# Patient Record
Sex: Female | Born: 1940
Health system: Southern US, Community
[De-identification: ages and names within clinical notes are randomized; demographics above are authoritative.]

## PROBLEM LIST (undated history)

## (undated) DIAGNOSIS — E119 Type 2 diabetes mellitus without complications: Secondary | ICD-10-CM

## (undated) DIAGNOSIS — I509 Heart failure, unspecified: Secondary | ICD-10-CM

## (undated) DIAGNOSIS — I350 Nonrheumatic aortic (valve) stenosis: Secondary | ICD-10-CM

## (undated) DIAGNOSIS — I10 Essential (primary) hypertension: Secondary | ICD-10-CM

## (undated) DIAGNOSIS — K5289 Other specified noninfective gastroenteritis and colitis: Secondary | ICD-10-CM

## (undated) DIAGNOSIS — M1990 Unspecified osteoarthritis, unspecified site: Secondary | ICD-10-CM

## (undated) DIAGNOSIS — C20 Malignant neoplasm of rectum: Secondary | ICD-10-CM

## (undated) DIAGNOSIS — J45909 Unspecified asthma, uncomplicated: Secondary | ICD-10-CM

## (undated) DIAGNOSIS — K219 Gastro-esophageal reflux disease without esophagitis: Secondary | ICD-10-CM

## (undated) DIAGNOSIS — K625 Hemorrhage of anus and rectum: Principal | ICD-10-CM

## (undated) DIAGNOSIS — E7800 Pure hypercholesterolemia, unspecified: Secondary | ICD-10-CM

## (undated) DIAGNOSIS — E039 Hypothyroidism, unspecified: Secondary | ICD-10-CM

## (undated) DIAGNOSIS — D649 Anemia, unspecified: Secondary | ICD-10-CM

## (undated) DIAGNOSIS — R011 Cardiac murmur, unspecified: Secondary | ICD-10-CM

## (undated) HISTORY — DX: Other specified noninfective gastroenteritis and colitis: K52.89

## (undated) HISTORY — DX: Heart failure, unspecified: I50.9

## (undated) HISTORY — PX: TOOTH EXTRACTION: SUR596

## (undated) HISTORY — PX: WISDOM TOOTH EXTRACTION: SHX21

## (undated) HISTORY — PX: IUD REMOVAL: SHX5392

## (undated) HISTORY — DX: Gastro-esophageal reflux disease without esophagitis: K21.9

## (undated) HISTORY — DX: Type 2 diabetes mellitus without complications: E11.9

## (undated) HISTORY — DX: Essential (primary) hypertension: I10

## (undated) HISTORY — DX: Hemorrhage of anus and rectum: K62.5

## (undated) HISTORY — PX: TONSILLECTOMY: SUR1361

## (undated) HISTORY — DX: Pure hypercholesterolemia, unspecified: E78.00

## (undated) HISTORY — DX: Nonrheumatic aortic (valve) stenosis: I35.0

---

## 2009-01-26 DIAGNOSIS — K625 Hemorrhage of anus and rectum: Secondary | ICD-10-CM

## 2009-01-26 HISTORY — DX: Hemorrhage of anus and rectum: K62.5

## 2009-01-26 HISTORY — PX: COLONOSCOPY: SHX174

## 2009-03-13 ENCOUNTER — Ambulatory Visit: Payer: Self-pay | Admitting: General Surgery

## 2009-03-26 ENCOUNTER — Ambulatory Visit: Payer: Self-pay | Admitting: General Surgery

## 2009-03-26 LAB — HM COLONOSCOPY

## 2010-05-06 ENCOUNTER — Ambulatory Visit: Payer: Self-pay | Admitting: Family Medicine

## 2010-05-06 DIAGNOSIS — R0989 Other specified symptoms and signs involving the circulatory and respiratory systems: Secondary | ICD-10-CM | POA: Insufficient documentation

## 2011-03-03 DIAGNOSIS — E119 Type 2 diabetes mellitus without complications: Secondary | ICD-10-CM | POA: Diagnosis not present

## 2011-03-03 DIAGNOSIS — I10 Essential (primary) hypertension: Secondary | ICD-10-CM | POA: Diagnosis not present

## 2011-03-03 DIAGNOSIS — E559 Vitamin D deficiency, unspecified: Secondary | ICD-10-CM | POA: Diagnosis not present

## 2011-03-03 DIAGNOSIS — E7800 Pure hypercholesterolemia, unspecified: Secondary | ICD-10-CM | POA: Diagnosis not present

## 2011-03-06 DIAGNOSIS — E7800 Pure hypercholesterolemia, unspecified: Secondary | ICD-10-CM | POA: Diagnosis not present

## 2011-03-06 DIAGNOSIS — E559 Vitamin D deficiency, unspecified: Secondary | ICD-10-CM | POA: Diagnosis not present

## 2011-03-06 DIAGNOSIS — E039 Hypothyroidism, unspecified: Secondary | ICD-10-CM | POA: Diagnosis not present

## 2011-03-06 DIAGNOSIS — I10 Essential (primary) hypertension: Secondary | ICD-10-CM | POA: Diagnosis not present

## 2011-03-06 DIAGNOSIS — E119 Type 2 diabetes mellitus without complications: Secondary | ICD-10-CM | POA: Diagnosis not present

## 2011-10-23 DIAGNOSIS — E785 Hyperlipidemia, unspecified: Secondary | ICD-10-CM | POA: Diagnosis not present

## 2011-10-23 DIAGNOSIS — E119 Type 2 diabetes mellitus without complications: Secondary | ICD-10-CM | POA: Diagnosis not present

## 2011-10-23 DIAGNOSIS — E039 Hypothyroidism, unspecified: Secondary | ICD-10-CM | POA: Diagnosis not present

## 2011-10-23 DIAGNOSIS — Z79899 Other long term (current) drug therapy: Secondary | ICD-10-CM | POA: Diagnosis not present

## 2011-10-27 DIAGNOSIS — E039 Hypothyroidism, unspecified: Secondary | ICD-10-CM | POA: Diagnosis not present

## 2011-10-27 DIAGNOSIS — E119 Type 2 diabetes mellitus without complications: Secondary | ICD-10-CM | POA: Diagnosis not present

## 2011-10-27 DIAGNOSIS — E7800 Pure hypercholesterolemia, unspecified: Secondary | ICD-10-CM | POA: Diagnosis not present

## 2011-10-27 DIAGNOSIS — Z79899 Other long term (current) drug therapy: Secondary | ICD-10-CM | POA: Diagnosis not present

## 2011-10-27 DIAGNOSIS — E559 Vitamin D deficiency, unspecified: Secondary | ICD-10-CM | POA: Diagnosis not present

## 2011-10-27 DIAGNOSIS — I10 Essential (primary) hypertension: Secondary | ICD-10-CM | POA: Diagnosis not present

## 2012-06-24 DIAGNOSIS — M779 Enthesopathy, unspecified: Secondary | ICD-10-CM | POA: Diagnosis not present

## 2012-06-24 DIAGNOSIS — M25569 Pain in unspecified knee: Secondary | ICD-10-CM | POA: Diagnosis not present

## 2012-06-24 DIAGNOSIS — Z79899 Other long term (current) drug therapy: Secondary | ICD-10-CM | POA: Diagnosis not present

## 2012-06-24 DIAGNOSIS — E785 Hyperlipidemia, unspecified: Secondary | ICD-10-CM | POA: Diagnosis not present

## 2012-06-27 DIAGNOSIS — K5289 Other specified noninfective gastroenteritis and colitis: Secondary | ICD-10-CM

## 2012-06-27 HISTORY — DX: Other specified noninfective gastroenteritis and colitis: K52.89

## 2012-07-01 ENCOUNTER — Encounter: Payer: Self-pay | Admitting: General Surgery

## 2012-07-01 ENCOUNTER — Ambulatory Visit (INDEPENDENT_AMBULATORY_CARE_PROVIDER_SITE_OTHER): Payer: Medicare Other | Admitting: General Surgery

## 2012-07-01 ENCOUNTER — Other Ambulatory Visit: Payer: Self-pay | Admitting: General Surgery

## 2012-07-01 VITALS — BP 130/76 | HR 76 | Temp 96.2°F | Resp 12 | Ht 64.0 in | Wt 173.0 lb

## 2012-07-01 DIAGNOSIS — K625 Hemorrhage of anus and rectum: Secondary | ICD-10-CM | POA: Diagnosis not present

## 2012-07-01 DIAGNOSIS — K5190 Ulcerative colitis, unspecified, without complications: Secondary | ICD-10-CM | POA: Diagnosis not present

## 2012-07-01 LAB — CBC WITH DIFFERENTIAL/PLATELET
Basophil #: 0.1 10*3/uL (ref 0.0–0.1)
Basophil %: 0.5 %
Eosinophil %: 0.6 %
Lymphocyte #: 0.9 10*3/uL — ABNORMAL LOW (ref 1.0–3.6)
Lymphocyte %: 6.6 %
MCH: 32.7 pg (ref 26.0–34.0)
MCHC: 34.2 g/dL (ref 32.0–36.0)
MCV: 96 fL (ref 80–100)
Monocyte %: 6.5 %
RBC: 3.88 10*6/uL (ref 3.80–5.20)
RDW: 13.2 % (ref 11.5–14.5)
WBC: 13 10*3/uL — ABNORMAL HIGH (ref 3.6–11.0)

## 2012-07-01 LAB — BASIC METABOLIC PANEL
Creatinine: 1.94 mg/dL — ABNORMAL HIGH (ref 0.60–1.30)
EGFR (African American): 29 — ABNORMAL LOW
Potassium: 4.1 mmol/L (ref 3.5–5.1)

## 2012-07-01 MED ORDER — AMOXICILLIN-POT CLAVULANATE 500-125 MG PO TABS: 1.0000 | ORAL_TABLET | Freq: Two times a day (BID) | ORAL | Status: DC

## 2012-07-01 MED ORDER — AMOXICILLIN-POT CLAVULANATE 875-125 MG PO TABS: 1.0000 | ORAL_TABLET | Freq: Two times a day (BID) | ORAL | Status: DC

## 2012-07-01 NOTE — Patient Instructions (Addendum)
Give an antibiotic 2 times daily. The patient is aware to use a heating pad as needed for comfort.

## 2012-07-01 NOTE — Progress Notes (Addendum)
Patient ID: Tracy Silva, female   DOB: 14-Oct-1940, 72 y.o.   MRN: [id number]  Chief Complaint  Patient presents with  . Rectal Problems    HPI Tracy Silva is a 72 y.o. female here today for rectal bleeding started this morning around 1.00am. Patient states it was bright red blood in the bowl and on the paper.  HPI Comments: The patient reports that earlier this morning she was awakened from sleep with lower bowel discomfort and went to the restroom. She thought she was having simple diarrhea, but on further inspection noticed bright red blood in the toilet bowl. This was occurred twice prior to presentation. She reports some lower abdominal discomfort. Perhaps mild nausea, but no vomiting. She had felt well prior to this morning. She has recently been started on Lipitor in question was medication could be responsible.  In 2011 she was evaluated for similar ill-defined lower abdominal pain and rectal bleeding. At that time a polyp was identified in the sigmoid colon which was a tubular adenoma and an area of erythematous tissue was biopsied showed similar changes. A 5 year followup had been planned. The patient's weight is a 20 pounds from her February 2011 exam.  The patient was accompanied today by her only child, Tracy Silva who was present for the interview and exam. Past Medical History  Diagnosis Date  . Hypertension   . Diabetes mellitus without complication   . Acid reflux   . High cholesterol   . Rectal bleeding 2011    Past Surgical History  Procedure Laterality Date  . Colonoscopy  2011  . Tonsillectomy    . Wisdom tooth extraction      Family History  Problem Relation Age of Onset  . Pancreatic cancer Mother     Social History History  Substance Use Topics  . Smoking status: Never Smoker   . Smokeless tobacco: Never Used  . Alcohol Use: No    Allergies  Allergen Reactions  . Codeine     headaches    Current Outpatient Prescriptions  Medication Sig  Dispense Refill  . amLODipine-benazepril (LOTREL) 5-10 MG per capsule Take 1 capsule by mouth daily.      Marland Kitchen amoxicillin-clavulanate (AUGMENTIN) 875-125 MG per tablet Take 1 tablet by mouth every 12 (twelve) hours.  20 tablet  0  . aspirin 81 MG tablet Take 81 mg by mouth daily.      Marland Kitchen atorvastatin (LIPITOR) 10 MG tablet Take 10 mg by mouth daily.      . Cholecalciferol (VITAMIN D) 1000 UNITS capsule Take 1,000 Units by mouth daily.      . hydrochlorothiazide (MICROZIDE) 12.5 MG capsule Take 12.5 mg by mouth daily.      Marland Kitchen levothyroxine (SYNTHROID, LEVOTHROID) 100 MCG tablet Take 100 mcg by mouth daily before breakfast.      . meloxicam (MOBIC) 7.5 MG tablet Take 7.5 mg by mouth 2 (two) times daily as needed for pain.      . metoprolol (TOPROL-XL) 200 MG 24 hr tablet Take 200 mg by mouth daily.      . Multiple Vitamins-Minerals (CENTRUM SILVER ADULT 50+ PO) Take 1 tablet by mouth daily.      Marland Kitchen omeprazole (PRILOSEC OTC) 20 MG tablet Take 20 mg by mouth daily.      . sitaGLIPtin (JANUVIA) 100 MG tablet Take 100 mg by mouth daily.       No current facility-administered medications for this visit.    Review of Systems Review  of Systems  Constitutional: Positive for chills. Negative for fever, diaphoresis, activity change, appetite change, fatigue and unexpected weight change.  HENT: Negative.   Respiratory: Negative.   Cardiovascular: Negative.   Gastrointestinal: Positive for nausea (alittle), abdominal pain and anal bleeding. Negative for vomiting, diarrhea, constipation, abdominal distention and rectal pain.    Blood pressure 130/76, pulse 76, temperature 96.2 F (35.7 C), resp. rate 12, height 5\' 4"  (1.626 m), weight 173 lb (78.472 kg).  Physical Exam Physical Exam  Constitutional: She appears well-developed and well-nourished.  Eyes: Conjunctivae are normal. No scleral icterus.  Neck: Neck supple.  Cardiovascular: Intact distal pulses and normal pulses.   Murmur heard.  Systolic  murmur is present with a grade of 3/6  Abdominal: Normal appearance and bowel sounds are normal. There is tenderness in the left lower quadrant. There is no rigidity, no rebound and no guarding. No hernia.  Mild left lower quadrant tenderness with firm palpation is appreciated. No peritoneal irritation, guarding or referred pain.    Data Reviewed Laboratory studies dated 03/06/2011 showed a hemoglobin of 13.6 with an MCV of 96. 39% polys, 44% lymphocytes. Hemoglobin 13.6. BUN at that time 36, creatinine 1.34 with an estimated GFR of 40. Hemoglobin A1c 8.1%.  Repeat comprehensive metabolic panel dated [id number] showed a creatinine of 1.86 with an estimated GFR of 26. Hemoglobin A1c 8.9%.      Colitis, acute.    Plan    The patient does not appear toxic. Considering the bright red blood evident in the toilet bowl on today's exam, and in light of her negative colonoscopy (tubular adenoma x2) in 2011, she'll be placed on Augmentin 500 mg by mouth twice a day. To minimize GIF-XQ then asked make use of a probiotic twice a day. If her symptoms worsen, she develops fever, chills or lightheadedness, she was encouraged to call over the weekend and will likely need to be evaluated in the emergency department. Otherwise, we'll contact her on Monday, June 9 for a followup.  A CBC and sed rate will be obtained today.     Her laboratory studies from October became available after the original prescription for Augmentin 875 mg twice a day had been sent to the [facility]. I contacted the pharmacy and canceled the 875 prescription and sent to new prescription for Augmentin 500 mg by mouth twice a day for 10 days.      CBC showed a white blood cell count of 13,000 with a left shift, 85% polys. Hemoglobin 12.7, minimally changed from February 2013. ESR: 20.  Renal function shows modest deterioration with a creatinine of 1.94, BUN 39, random blood sugar 192. Estimated GFR 25.  Tracy Silva 07/01/2012, 12:09 PM

## 2012-07-04 ENCOUNTER — Encounter: Payer: Self-pay | Admitting: General Surgery

## 2012-07-04 ENCOUNTER — Telehealth: Payer: Self-pay | Admitting: General Surgery

## 2012-07-04 NOTE — Telephone Encounter (Signed)
Patient reports she is now pain free. Bleeding stopped yesterday. Tolerating a regular diet. Will arrange for an office f/u in one week.

## 2012-07-05 ENCOUNTER — Telehealth: Payer: Self-pay | Admitting: *Deleted

## 2012-07-05 NOTE — Telephone Encounter (Signed)
Phone call from patient states she is constipated.  No bm since Friday.  Advised to try miralax daily and to call back for further issues.

## 2012-07-11 ENCOUNTER — Ambulatory Visit (INDEPENDENT_AMBULATORY_CARE_PROVIDER_SITE_OTHER): Payer: Medicare Other | Admitting: General Surgery

## 2012-07-11 ENCOUNTER — Encounter: Payer: Self-pay | Admitting: General Surgery

## 2012-07-11 VITALS — BP 140/78 | HR 74 | Resp 14 | Ht 63.0 in | Wt 168.0 lb

## 2012-07-11 DIAGNOSIS — K529 Noninfective gastroenteritis and colitis, unspecified: Secondary | ICD-10-CM

## 2012-07-11 DIAGNOSIS — K5289 Other specified noninfective gastroenteritis and colitis: Secondary | ICD-10-CM | POA: Diagnosis not present

## 2012-07-11 DIAGNOSIS — K625 Hemorrhage of anus and rectum: Secondary | ICD-10-CM

## 2012-07-11 LAB — POC HEMOCCULT BLD/STL (OFFICE/1-CARD/DIAGNOSTIC): Fecal Occult Blood, POC: NEGATIVE

## 2012-07-11 NOTE — Progress Notes (Signed)
Patient ID: Tracy Silva, female   DOB: 25-Mar-1940, 72 y.o.   MRN: [id number]  Chief Complaint  Patient presents with  . Follow-up    colitis    HPI Tracy Silva is a 72 y.o. female here today following up her recent episode of colitis associated with crampy lower abdominal pain and bloody diarrhea.  Patient was previously seen for rectal bleeding with diarrhea and nausea. The patient complained of abdominal discomfort at that time which awakened her in from sleeping.  In 2011 she was evaluated for similar ill-defined lower abdominal pain and rectal bleeding. At that time a polyp was identified in the sigmoid colon which was a tubular adenoma and an area of erythematous tissue was biopsied showed similar changes.  The patient reports that the bleeding discontinued 3 days after the initiation of Augmentin therapy and she has been pain-free since that time. She is back on a regular diet without ill effect. She will be completing her Augmentin therapy later today.   HPI  Past Medical History  Diagnosis Date  . Hypertension   . Diabetes mellitus without complication   . Acid reflux   . High cholesterol   . Rectal bleeding 2011  . Other and unspecified noninfectious gastroenteritis and colitis(558.9) 06/27/2012    Past Surgical History  Procedure Laterality Date  . Colonoscopy  2011  . Tonsillectomy    . Wisdom tooth extraction      Family History  Problem Relation Age of Onset  . Pancreatic cancer Mother     Social History History  Substance Use Topics  . Smoking status: Never Smoker   . Smokeless tobacco: Never Used  . Alcohol Use: No    Allergies  Allergen Reactions  . Codeine     headaches    Current Outpatient Prescriptions  Medication Sig Dispense Refill  . amLODipine-benazepril (LOTREL) 5-10 MG per capsule Take 1 capsule by mouth daily.      Marland Kitchen amoxicillin-clavulanate (AUGMENTIN) 500-125 MG per tablet Take 1 tablet (500 mg total) by mouth 2 (two) times daily.   20 tablet  0  . aspirin 81 MG tablet Take 81 mg by mouth daily.      Marland Kitchen atorvastatin (LIPITOR) 10 MG tablet Take 10 mg by mouth daily.      . Cholecalciferol (VITAMIN D) 1000 UNITS capsule Take 1,000 Units by mouth daily.      . hydrochlorothiazide (MICROZIDE) 12.5 MG capsule Take 12.5 mg by mouth daily.      Marland Kitchen levothyroxine (SYNTHROID, LEVOTHROID) 100 MCG tablet Take 100 mcg by mouth daily before breakfast.      . meloxicam (MOBIC) 7.5 MG tablet Take 7.5 mg by mouth 2 (two) times daily as needed for pain.      . metoprolol (TOPROL-XL) 200 MG 24 hr tablet Take 200 mg by mouth daily.      . Multiple Vitamins-Minerals (CENTRUM SILVER ADULT 50+ PO) Take 1 tablet by mouth daily.      Marland Kitchen omeprazole (PRILOSEC OTC) 20 MG tablet Take 20 mg by mouth daily.      . sitaGLIPtin (JANUVIA) 100 MG tablet Take 100 mg by mouth daily.       No current facility-administered medications for this visit.    Review of Systems Review of Systems  Constitutional: Negative.   Respiratory: Negative.   Cardiovascular: Negative.     Blood pressure 140/78, pulse 74, resp. rate 14, height 5\' 3"  (1.6 m), weight 168 lb (76.204 kg).  Physical Exam Physical  Exam  Constitutional: She appears well-developed and well-nourished.  Cardiovascular: Normal rate and regular rhythm.   Pulmonary/Chest: Effort normal and breath sounds normal.  Abdominal: Soft. Bowel sounds are normal. She exhibits no distension and no mass. There is no tenderness.  Genitourinary: Rectum normal. Rectal exam shows no tenderness and anal tone normal. Guaiac negative stool.    Data Reviewed CBC obtained at the beginning of this episode showed a y plus O. Connell 13,000 with left shift.  Assessment    Colitis, resolved. Colonoscopy in 2011 showing a single tubular adenoma.     Plan    The patient will finish up her present course of Augmentin. She'll contact the office if she has any recurrent symptoms, will plan for a quick followup visit in  3 months to assess her progress.     Patient to have the following labs drawn at [facility] today: CBC.    Robert Bellow 07/12/2012, 8:25 PM

## 2012-07-11 NOTE — Patient Instructions (Addendum)
Patient to return 3 months    Patient to have labs drawn at [facility] today.

## 2012-07-12 ENCOUNTER — Encounter: Payer: Self-pay | Admitting: General Surgery

## 2012-07-12 LAB — CBC WITH DIFFERENTIAL/PLATELET
Basos: 1 % (ref 0–3)
Eos: 3 % (ref 0–5)
Immature Grans (Abs): 0 10*3/uL (ref 0.0–0.1)
Lymphs: 37 % (ref 14–46)
MCV: 94 fL (ref 79–97)
Monocytes Absolute: 0.6 10*3/uL (ref 0.1–0.9)
Monocytes: 9 % (ref 4–12)
Neutrophils Relative %: 50 % (ref 40–74)
RBC: 3.81 x10E6/uL (ref 3.77–5.28)
WBC: 6.3 10*3/uL (ref 3.4–10.8)

## 2012-07-21 DIAGNOSIS — N289 Disorder of kidney and ureter, unspecified: Secondary | ICD-10-CM | POA: Diagnosis not present

## 2012-07-21 DIAGNOSIS — I10 Essential (primary) hypertension: Secondary | ICD-10-CM | POA: Diagnosis not present

## 2012-07-21 DIAGNOSIS — E7800 Pure hypercholesterolemia, unspecified: Secondary | ICD-10-CM | POA: Diagnosis not present

## 2012-07-21 DIAGNOSIS — E119 Type 2 diabetes mellitus without complications: Secondary | ICD-10-CM | POA: Diagnosis not present

## 2012-07-22 DIAGNOSIS — E785 Hyperlipidemia, unspecified: Secondary | ICD-10-CM | POA: Diagnosis not present

## 2012-07-22 DIAGNOSIS — E039 Hypothyroidism, unspecified: Secondary | ICD-10-CM | POA: Diagnosis not present

## 2012-07-22 DIAGNOSIS — E119 Type 2 diabetes mellitus without complications: Secondary | ICD-10-CM | POA: Diagnosis not present

## 2012-07-22 DIAGNOSIS — E559 Vitamin D deficiency, unspecified: Secondary | ICD-10-CM | POA: Diagnosis not present

## 2012-07-22 DIAGNOSIS — I10 Essential (primary) hypertension: Secondary | ICD-10-CM | POA: Diagnosis not present

## 2012-10-19 ENCOUNTER — Ambulatory Visit: Payer: Self-pay | Admitting: General Surgery

## 2012-10-19 DIAGNOSIS — E039 Hypothyroidism, unspecified: Secondary | ICD-10-CM | POA: Diagnosis not present

## 2012-10-19 DIAGNOSIS — I10 Essential (primary) hypertension: Secondary | ICD-10-CM | POA: Diagnosis not present

## 2012-10-19 DIAGNOSIS — E119 Type 2 diabetes mellitus without complications: Secondary | ICD-10-CM | POA: Diagnosis not present

## 2012-10-19 DIAGNOSIS — E7800 Pure hypercholesterolemia, unspecified: Secondary | ICD-10-CM | POA: Diagnosis not present

## 2012-10-25 ENCOUNTER — Encounter: Payer: Self-pay | Admitting: General Surgery

## 2012-10-25 ENCOUNTER — Ambulatory Visit (INDEPENDENT_AMBULATORY_CARE_PROVIDER_SITE_OTHER): Payer: Medicare Other | Admitting: General Surgery

## 2012-10-25 VITALS — BP 130/70 | HR 74 | Resp 12 | Ht 63.0 in | Wt 169.0 lb

## 2012-10-25 DIAGNOSIS — K5289 Other specified noninfective gastroenteritis and colitis: Secondary | ICD-10-CM

## 2012-10-25 DIAGNOSIS — K529 Noninfective gastroenteritis and colitis, unspecified: Secondary | ICD-10-CM

## 2012-10-25 NOTE — Progress Notes (Signed)
Patient ID: Tracy Silva, female   DOB: 18-Oct-1940, 72 y.o.   MRN: [id number]  Chief Complaint  Patient presents with  . Other    colitis    HPI Tracy Silva is a 72 y.o. female here today following up from her three month colitis. Patient states she is not having any more problems with her bowels. No further bleeding reported.    HPI  Past Medical History  Diagnosis Date  . Hypertension   . Diabetes mellitus without complication   . Acid reflux   . High cholesterol   . Rectal bleeding 2011  . Other and unspecified noninfectious gastroenteritis and colitis(558.9) 06/27/2012    Past Surgical History  Procedure Laterality Date  . Colonoscopy  2011  . Tonsillectomy    . Wisdom tooth extraction      Family History  Problem Relation Age of Onset  . Pancreatic cancer Mother     Social History History  Substance Use Topics  . Smoking status: Never Smoker   . Smokeless tobacco: Never Used  . Alcohol Use: No    Allergies  Allergen Reactions  . Codeine     headaches    Current Outpatient Prescriptions  Medication Sig Dispense Refill  . amLODipine-benazepril (LOTREL) 5-10 MG per capsule Take 1 capsule by mouth daily.      Marland Kitchen amoxicillin-clavulanate (AUGMENTIN) 500-125 MG per tablet Take 1 tablet (500 mg total) by mouth 2 (two) times daily.  20 tablet  0  . aspirin 81 MG tablet Take 81 mg by mouth daily.      Marland Kitchen atorvastatin (LIPITOR) 10 MG tablet Take 10 mg by mouth daily.      . Cholecalciferol (VITAMIN D) 1000 UNITS capsule Take 1,000 Units by mouth daily.      . hydrochlorothiazide (MICROZIDE) 12.5 MG capsule Take 12.5 mg by mouth daily.      Marland Kitchen levothyroxine (SYNTHROID, LEVOTHROID) 100 MCG tablet Take 100 mcg by mouth daily before breakfast.      . meloxicam (MOBIC) 7.5 MG tablet Take 7.5 mg by mouth 2 (two) times daily as needed for pain.      . metoprolol (TOPROL-XL) 200 MG 24 hr tablet Take 200 mg by mouth daily.      . Multiple Vitamins-Minerals (CENTRUM SILVER  ADULT 50+ PO) Take 1 tablet by mouth daily.      Marland Kitchen omeprazole (PRILOSEC OTC) 20 MG tablet Take 20 mg by mouth daily.      . sitaGLIPtin (JANUVIA) 100 MG tablet Take 100 mg by mouth daily.       No current facility-administered medications for this visit.    Review of Systems Review of Systems  Constitutional: Negative.   Respiratory: Negative.   Cardiovascular: Negative.   Gastrointestinal: Negative.     Blood pressure 130/70, pulse 74, resp. rate 12, height 5\' 3"  (1.6 m), weight 169 lb (76.658 kg).  Physical Exam Physical Exam  Constitutional: She is oriented to person, place, and time. She appears well-developed and well-nourished.  Cardiovascular: Normal rate, regular rhythm and normal heart sounds.   Pulmonary/Chest: Breath sounds normal.  Abdominal: Soft. Normal appearance and bowel sounds are normal. There is no hepatomegaly. There is no tenderness. No hernia.  Lymphadenopathy:    She has no cervical adenopathy.  Neurological: She is alert and oriented to person, place, and time.  Skin: Skin is warm.    Data Reviewed No new data.  Assessment    Based on the findings of a adenomatous polyp  in 2011, a repeat colonoscopy has been recommended for 2016.     Plan    Patient to return in two year for her colonoscopy.        Robert Bellow 10/25/2012, 9:11 PM

## 2012-10-25 NOTE — Patient Instructions (Signed)
Patient to return in two years for colonoscopy.

## 2012-10-28 DIAGNOSIS — E119 Type 2 diabetes mellitus without complications: Secondary | ICD-10-CM | POA: Diagnosis not present

## 2012-11-17 DIAGNOSIS — E039 Hypothyroidism, unspecified: Secondary | ICD-10-CM | POA: Diagnosis not present

## 2012-11-17 DIAGNOSIS — E7800 Pure hypercholesterolemia, unspecified: Secondary | ICD-10-CM | POA: Diagnosis not present

## 2012-11-17 DIAGNOSIS — E119 Type 2 diabetes mellitus without complications: Secondary | ICD-10-CM | POA: Diagnosis not present

## 2012-11-17 DIAGNOSIS — I10 Essential (primary) hypertension: Secondary | ICD-10-CM | POA: Diagnosis not present

## 2012-11-17 DIAGNOSIS — Z23 Encounter for immunization: Secondary | ICD-10-CM | POA: Diagnosis not present

## 2013-02-01 DIAGNOSIS — E7800 Pure hypercholesterolemia, unspecified: Secondary | ICD-10-CM | POA: Diagnosis not present

## 2013-02-01 DIAGNOSIS — Z23 Encounter for immunization: Secondary | ICD-10-CM | POA: Diagnosis not present

## 2013-02-01 DIAGNOSIS — E119 Type 2 diabetes mellitus without complications: Secondary | ICD-10-CM | POA: Diagnosis not present

## 2013-02-01 DIAGNOSIS — E039 Hypothyroidism, unspecified: Secondary | ICD-10-CM | POA: Diagnosis not present

## 2013-02-01 DIAGNOSIS — I10 Essential (primary) hypertension: Secondary | ICD-10-CM | POA: Diagnosis not present

## 2013-02-09 DIAGNOSIS — E785 Hyperlipidemia, unspecified: Secondary | ICD-10-CM | POA: Diagnosis not present

## 2013-02-09 DIAGNOSIS — E119 Type 2 diabetes mellitus without complications: Secondary | ICD-10-CM | POA: Diagnosis not present

## 2013-02-09 DIAGNOSIS — E039 Hypothyroidism, unspecified: Secondary | ICD-10-CM | POA: Diagnosis not present

## 2013-06-05 DIAGNOSIS — I10 Essential (primary) hypertension: Secondary | ICD-10-CM | POA: Diagnosis not present

## 2013-06-05 DIAGNOSIS — J309 Allergic rhinitis, unspecified: Secondary | ICD-10-CM | POA: Diagnosis not present

## 2013-06-05 DIAGNOSIS — E119 Type 2 diabetes mellitus without complications: Secondary | ICD-10-CM | POA: Diagnosis not present

## 2013-06-05 DIAGNOSIS — E039 Hypothyroidism, unspecified: Secondary | ICD-10-CM | POA: Diagnosis not present

## 2013-06-05 DIAGNOSIS — Z23 Encounter for immunization: Secondary | ICD-10-CM | POA: Diagnosis not present

## 2013-06-05 DIAGNOSIS — E7800 Pure hypercholesterolemia, unspecified: Secondary | ICD-10-CM | POA: Diagnosis not present

## 2013-08-29 DIAGNOSIS — E7800 Pure hypercholesterolemia, unspecified: Secondary | ICD-10-CM | POA: Diagnosis not present

## 2013-08-29 DIAGNOSIS — J309 Allergic rhinitis, unspecified: Secondary | ICD-10-CM | POA: Diagnosis not present

## 2013-08-29 DIAGNOSIS — Z0000 Encounter for general adult medical examination without abnormal findings: Secondary | ICD-10-CM | POA: Diagnosis not present

## 2013-08-29 DIAGNOSIS — Z1339 Encounter for screening examination for other mental health and behavioral disorders: Secondary | ICD-10-CM | POA: Diagnosis not present

## 2013-08-29 DIAGNOSIS — I10 Essential (primary) hypertension: Secondary | ICD-10-CM | POA: Diagnosis not present

## 2013-08-29 DIAGNOSIS — Z1331 Encounter for screening for depression: Secondary | ICD-10-CM | POA: Diagnosis not present

## 2013-08-29 DIAGNOSIS — Z23 Encounter for immunization: Secondary | ICD-10-CM | POA: Diagnosis not present

## 2013-08-29 DIAGNOSIS — E039 Hypothyroidism, unspecified: Secondary | ICD-10-CM | POA: Diagnosis not present

## 2013-11-27 ENCOUNTER — Encounter: Payer: Self-pay | Admitting: General Surgery

## 2013-11-27 DIAGNOSIS — I10 Essential (primary) hypertension: Secondary | ICD-10-CM | POA: Diagnosis not present

## 2013-11-27 DIAGNOSIS — I119 Hypertensive heart disease without heart failure: Secondary | ICD-10-CM | POA: Diagnosis not present

## 2013-11-27 DIAGNOSIS — Z23 Encounter for immunization: Secondary | ICD-10-CM | POA: Diagnosis not present

## 2013-11-27 DIAGNOSIS — J351 Hypertrophy of tonsils: Secondary | ICD-10-CM | POA: Diagnosis not present

## 2013-11-27 DIAGNOSIS — E780 Pure hypercholesterolemia: Secondary | ICD-10-CM | POA: Diagnosis not present

## 2013-11-29 DIAGNOSIS — E039 Hypothyroidism, unspecified: Secondary | ICD-10-CM | POA: Diagnosis not present

## 2013-11-29 DIAGNOSIS — E119 Type 2 diabetes mellitus without complications: Secondary | ICD-10-CM | POA: Diagnosis not present

## 2013-11-29 DIAGNOSIS — I10 Essential (primary) hypertension: Secondary | ICD-10-CM | POA: Diagnosis not present

## 2013-11-29 DIAGNOSIS — E785 Hyperlipidemia, unspecified: Secondary | ICD-10-CM | POA: Diagnosis not present

## 2014-05-10 DIAGNOSIS — I10 Essential (primary) hypertension: Secondary | ICD-10-CM | POA: Diagnosis not present

## 2014-05-10 DIAGNOSIS — K219 Gastro-esophageal reflux disease without esophagitis: Secondary | ICD-10-CM | POA: Diagnosis not present

## 2014-05-10 DIAGNOSIS — J309 Allergic rhinitis, unspecified: Secondary | ICD-10-CM | POA: Diagnosis not present

## 2014-05-10 DIAGNOSIS — E785 Hyperlipidemia, unspecified: Secondary | ICD-10-CM | POA: Diagnosis not present

## 2014-05-10 DIAGNOSIS — E119 Type 2 diabetes mellitus without complications: Secondary | ICD-10-CM | POA: Diagnosis not present

## 2014-05-10 LAB — HEMOGLOBIN A1C: Hgb A1c MFr Bld: 7.8 % — AB (ref 4.0–6.0)

## 2014-05-16 DIAGNOSIS — I10 Essential (primary) hypertension: Secondary | ICD-10-CM | POA: Diagnosis not present

## 2014-05-16 DIAGNOSIS — E785 Hyperlipidemia, unspecified: Secondary | ICD-10-CM | POA: Diagnosis not present

## 2014-05-16 DIAGNOSIS — E039 Hypothyroidism, unspecified: Secondary | ICD-10-CM | POA: Diagnosis not present

## 2014-05-16 LAB — HEPATIC FUNCTION PANEL
ALK PHOS: 73 U/L (ref 25–125)
ALT: 20 U/L (ref 7–35)
AST: 21 U/L (ref 13–35)
Bilirubin, Total: 0.3 mg/dL

## 2014-05-16 LAB — BASIC METABOLIC PANEL
BUN: 35 mg/dL — AB (ref 4–21)
CREATININE: 1.7 mg/dL — AB (ref 0.5–1.1)
Glucose: 167 mg/dL
POTASSIUM: 4.7 mmol/L (ref 3.4–5.3)
SODIUM: 142 mmol/L (ref 137–147)

## 2014-05-16 LAB — LIPID PANEL
Cholesterol: 309 mg/dL — AB (ref 0–200)
HDL: 42 mg/dL (ref 35–70)
LDL Cholesterol: 203 mg/dL
LDL/HDL RATIO: 4.8
TRIGLYCERIDES: 318 mg/dL — AB (ref 40–160)

## 2014-05-16 LAB — CBC AND DIFFERENTIAL
HCT: 36 % (ref 36–46)
HEMOGLOBIN: 12 g/dL (ref 12.0–16.0)
Neutrophils Absolute: 2 /uL
PLATELETS: 350 10*3/uL (ref 150–399)
WBC: 5 10^3/mL

## 2014-05-16 LAB — TSH: TSH: 1.13 u[IU]/mL (ref 0.41–5.90)

## 2014-07-02 ENCOUNTER — Other Ambulatory Visit: Payer: Self-pay | Admitting: Family Medicine

## 2014-07-31 ENCOUNTER — Other Ambulatory Visit: Payer: Self-pay | Admitting: Family Medicine

## 2014-08-23 DIAGNOSIS — M5416 Radiculopathy, lumbar region: Secondary | ICD-10-CM | POA: Diagnosis not present

## 2014-08-23 DIAGNOSIS — M955 Acquired deformity of pelvis: Secondary | ICD-10-CM | POA: Diagnosis not present

## 2014-08-23 DIAGNOSIS — M9905 Segmental and somatic dysfunction of pelvic region: Secondary | ICD-10-CM | POA: Diagnosis not present

## 2014-08-23 DIAGNOSIS — M9903 Segmental and somatic dysfunction of lumbar region: Secondary | ICD-10-CM | POA: Diagnosis not present

## 2014-08-24 DIAGNOSIS — M9905 Segmental and somatic dysfunction of pelvic region: Secondary | ICD-10-CM | POA: Diagnosis not present

## 2014-08-24 DIAGNOSIS — M955 Acquired deformity of pelvis: Secondary | ICD-10-CM | POA: Diagnosis not present

## 2014-08-24 DIAGNOSIS — M5416 Radiculopathy, lumbar region: Secondary | ICD-10-CM | POA: Diagnosis not present

## 2014-08-24 DIAGNOSIS — M9903 Segmental and somatic dysfunction of lumbar region: Secondary | ICD-10-CM | POA: Diagnosis not present

## 2014-08-27 DIAGNOSIS — M9905 Segmental and somatic dysfunction of pelvic region: Secondary | ICD-10-CM | POA: Diagnosis not present

## 2014-08-27 DIAGNOSIS — M9903 Segmental and somatic dysfunction of lumbar region: Secondary | ICD-10-CM | POA: Diagnosis not present

## 2014-08-27 DIAGNOSIS — M5416 Radiculopathy, lumbar region: Secondary | ICD-10-CM | POA: Diagnosis not present

## 2014-08-27 DIAGNOSIS — M955 Acquired deformity of pelvis: Secondary | ICD-10-CM | POA: Diagnosis not present

## 2014-08-29 DIAGNOSIS — M9903 Segmental and somatic dysfunction of lumbar region: Secondary | ICD-10-CM | POA: Diagnosis not present

## 2014-08-29 DIAGNOSIS — M5416 Radiculopathy, lumbar region: Secondary | ICD-10-CM | POA: Diagnosis not present

## 2014-08-29 DIAGNOSIS — M955 Acquired deformity of pelvis: Secondary | ICD-10-CM | POA: Diagnosis not present

## 2014-08-29 DIAGNOSIS — M9905 Segmental and somatic dysfunction of pelvic region: Secondary | ICD-10-CM | POA: Diagnosis not present

## 2014-08-30 DIAGNOSIS — M5416 Radiculopathy, lumbar region: Secondary | ICD-10-CM | POA: Diagnosis not present

## 2014-08-30 DIAGNOSIS — M9905 Segmental and somatic dysfunction of pelvic region: Secondary | ICD-10-CM | POA: Diagnosis not present

## 2014-08-30 DIAGNOSIS — M955 Acquired deformity of pelvis: Secondary | ICD-10-CM | POA: Diagnosis not present

## 2014-08-30 DIAGNOSIS — M9903 Segmental and somatic dysfunction of lumbar region: Secondary | ICD-10-CM | POA: Diagnosis not present

## 2014-09-03 DIAGNOSIS — M5416 Radiculopathy, lumbar region: Secondary | ICD-10-CM | POA: Diagnosis not present

## 2014-09-03 DIAGNOSIS — M9905 Segmental and somatic dysfunction of pelvic region: Secondary | ICD-10-CM | POA: Diagnosis not present

## 2014-09-03 DIAGNOSIS — M9903 Segmental and somatic dysfunction of lumbar region: Secondary | ICD-10-CM | POA: Diagnosis not present

## 2014-09-03 DIAGNOSIS — M955 Acquired deformity of pelvis: Secondary | ICD-10-CM | POA: Diagnosis not present

## 2014-09-05 DIAGNOSIS — M5416 Radiculopathy, lumbar region: Secondary | ICD-10-CM | POA: Diagnosis not present

## 2014-09-05 DIAGNOSIS — M955 Acquired deformity of pelvis: Secondary | ICD-10-CM | POA: Diagnosis not present

## 2014-09-05 DIAGNOSIS — M9903 Segmental and somatic dysfunction of lumbar region: Secondary | ICD-10-CM | POA: Diagnosis not present

## 2014-09-05 DIAGNOSIS — M9905 Segmental and somatic dysfunction of pelvic region: Secondary | ICD-10-CM | POA: Diagnosis not present

## 2014-09-06 DIAGNOSIS — M955 Acquired deformity of pelvis: Secondary | ICD-10-CM | POA: Diagnosis not present

## 2014-09-06 DIAGNOSIS — M9903 Segmental and somatic dysfunction of lumbar region: Secondary | ICD-10-CM | POA: Diagnosis not present

## 2014-09-06 DIAGNOSIS — M5416 Radiculopathy, lumbar region: Secondary | ICD-10-CM | POA: Diagnosis not present

## 2014-09-06 DIAGNOSIS — M9905 Segmental and somatic dysfunction of pelvic region: Secondary | ICD-10-CM | POA: Diagnosis not present

## 2014-09-07 DIAGNOSIS — E669 Obesity, unspecified: Secondary | ICD-10-CM | POA: Insufficient documentation

## 2014-09-07 DIAGNOSIS — E1169 Type 2 diabetes mellitus with other specified complication: Secondary | ICD-10-CM | POA: Insufficient documentation

## 2014-09-07 DIAGNOSIS — I351 Nonrheumatic aortic (valve) insufficiency: Secondary | ICD-10-CM | POA: Insufficient documentation

## 2014-09-07 DIAGNOSIS — E1122 Type 2 diabetes mellitus with diabetic chronic kidney disease: Secondary | ICD-10-CM | POA: Insufficient documentation

## 2014-09-07 DIAGNOSIS — Z8709 Personal history of other diseases of the respiratory system: Secondary | ICD-10-CM | POA: Insufficient documentation

## 2014-09-07 DIAGNOSIS — E785 Hyperlipidemia, unspecified: Secondary | ICD-10-CM | POA: Insufficient documentation

## 2014-09-07 DIAGNOSIS — F32A Depression, unspecified: Secondary | ICD-10-CM | POA: Insufficient documentation

## 2014-09-07 DIAGNOSIS — Z8639 Personal history of other endocrine, nutritional and metabolic disease: Secondary | ICD-10-CM | POA: Insufficient documentation

## 2014-09-07 DIAGNOSIS — Z8619 Personal history of other infectious and parasitic diseases: Secondary | ICD-10-CM | POA: Insufficient documentation

## 2014-09-07 DIAGNOSIS — E119 Type 2 diabetes mellitus without complications: Secondary | ICD-10-CM | POA: Insufficient documentation

## 2014-09-07 DIAGNOSIS — I10 Essential (primary) hypertension: Secondary | ICD-10-CM | POA: Insufficient documentation

## 2014-09-07 DIAGNOSIS — N951 Menopausal and female climacteric states: Secondary | ICD-10-CM | POA: Insufficient documentation

## 2014-09-07 DIAGNOSIS — F329 Major depressive disorder, single episode, unspecified: Secondary | ICD-10-CM | POA: Insufficient documentation

## 2014-09-07 DIAGNOSIS — L821 Other seborrheic keratosis: Secondary | ICD-10-CM | POA: Insufficient documentation

## 2014-09-07 DIAGNOSIS — J309 Allergic rhinitis, unspecified: Secondary | ICD-10-CM | POA: Insufficient documentation

## 2014-09-07 DIAGNOSIS — E559 Vitamin D deficiency, unspecified: Secondary | ICD-10-CM | POA: Insufficient documentation

## 2014-09-07 DIAGNOSIS — K219 Gastro-esophageal reflux disease without esophagitis: Secondary | ICD-10-CM | POA: Insufficient documentation

## 2014-09-07 DIAGNOSIS — E039 Hypothyroidism, unspecified: Secondary | ICD-10-CM | POA: Insufficient documentation

## 2014-09-07 HISTORY — DX: Personal history of other endocrine, nutritional and metabolic disease: Z86.39

## 2014-09-07 HISTORY — DX: Nonrheumatic aortic (valve) insufficiency: I35.1

## 2014-09-10 DIAGNOSIS — M5416 Radiculopathy, lumbar region: Secondary | ICD-10-CM | POA: Diagnosis not present

## 2014-09-10 DIAGNOSIS — M9905 Segmental and somatic dysfunction of pelvic region: Secondary | ICD-10-CM | POA: Diagnosis not present

## 2014-09-10 DIAGNOSIS — M955 Acquired deformity of pelvis: Secondary | ICD-10-CM | POA: Diagnosis not present

## 2014-09-10 DIAGNOSIS — M9903 Segmental and somatic dysfunction of lumbar region: Secondary | ICD-10-CM | POA: Diagnosis not present

## 2014-09-12 DIAGNOSIS — M9905 Segmental and somatic dysfunction of pelvic region: Secondary | ICD-10-CM | POA: Diagnosis not present

## 2014-09-12 DIAGNOSIS — M9903 Segmental and somatic dysfunction of lumbar region: Secondary | ICD-10-CM | POA: Diagnosis not present

## 2014-09-12 DIAGNOSIS — M955 Acquired deformity of pelvis: Secondary | ICD-10-CM | POA: Diagnosis not present

## 2014-09-12 DIAGNOSIS — M5416 Radiculopathy, lumbar region: Secondary | ICD-10-CM | POA: Diagnosis not present

## 2014-09-13 DIAGNOSIS — M955 Acquired deformity of pelvis: Secondary | ICD-10-CM | POA: Diagnosis not present

## 2014-09-13 DIAGNOSIS — M9903 Segmental and somatic dysfunction of lumbar region: Secondary | ICD-10-CM | POA: Diagnosis not present

## 2014-09-13 DIAGNOSIS — M9905 Segmental and somatic dysfunction of pelvic region: Secondary | ICD-10-CM | POA: Diagnosis not present

## 2014-09-13 DIAGNOSIS — M5416 Radiculopathy, lumbar region: Secondary | ICD-10-CM | POA: Diagnosis not present

## 2014-09-17 ENCOUNTER — Ambulatory Visit: Payer: Self-pay | Admitting: Family Medicine

## 2014-09-17 DIAGNOSIS — M955 Acquired deformity of pelvis: Secondary | ICD-10-CM | POA: Diagnosis not present

## 2014-09-17 DIAGNOSIS — M9903 Segmental and somatic dysfunction of lumbar region: Secondary | ICD-10-CM | POA: Diagnosis not present

## 2014-09-17 DIAGNOSIS — M9905 Segmental and somatic dysfunction of pelvic region: Secondary | ICD-10-CM | POA: Diagnosis not present

## 2014-09-17 DIAGNOSIS — M5416 Radiculopathy, lumbar region: Secondary | ICD-10-CM | POA: Diagnosis not present

## 2014-09-19 DIAGNOSIS — M5416 Radiculopathy, lumbar region: Secondary | ICD-10-CM | POA: Diagnosis not present

## 2014-09-19 DIAGNOSIS — M9903 Segmental and somatic dysfunction of lumbar region: Secondary | ICD-10-CM | POA: Diagnosis not present

## 2014-09-19 DIAGNOSIS — M9905 Segmental and somatic dysfunction of pelvic region: Secondary | ICD-10-CM | POA: Diagnosis not present

## 2014-09-19 DIAGNOSIS — M955 Acquired deformity of pelvis: Secondary | ICD-10-CM | POA: Diagnosis not present

## 2014-09-20 DIAGNOSIS — M9903 Segmental and somatic dysfunction of lumbar region: Secondary | ICD-10-CM | POA: Diagnosis not present

## 2014-09-20 DIAGNOSIS — M9905 Segmental and somatic dysfunction of pelvic region: Secondary | ICD-10-CM | POA: Diagnosis not present

## 2014-09-20 DIAGNOSIS — M5416 Radiculopathy, lumbar region: Secondary | ICD-10-CM | POA: Diagnosis not present

## 2014-09-20 DIAGNOSIS — M955 Acquired deformity of pelvis: Secondary | ICD-10-CM | POA: Diagnosis not present

## 2014-09-24 DIAGNOSIS — M9903 Segmental and somatic dysfunction of lumbar region: Secondary | ICD-10-CM | POA: Diagnosis not present

## 2014-09-24 DIAGNOSIS — M5416 Radiculopathy, lumbar region: Secondary | ICD-10-CM | POA: Diagnosis not present

## 2014-09-24 DIAGNOSIS — M955 Acquired deformity of pelvis: Secondary | ICD-10-CM | POA: Diagnosis not present

## 2014-09-24 DIAGNOSIS — M9905 Segmental and somatic dysfunction of pelvic region: Secondary | ICD-10-CM | POA: Diagnosis not present

## 2014-09-26 DIAGNOSIS — M5416 Radiculopathy, lumbar region: Secondary | ICD-10-CM | POA: Diagnosis not present

## 2014-09-26 DIAGNOSIS — M9905 Segmental and somatic dysfunction of pelvic region: Secondary | ICD-10-CM | POA: Diagnosis not present

## 2014-09-26 DIAGNOSIS — M955 Acquired deformity of pelvis: Secondary | ICD-10-CM | POA: Diagnosis not present

## 2014-09-26 DIAGNOSIS — M9903 Segmental and somatic dysfunction of lumbar region: Secondary | ICD-10-CM | POA: Diagnosis not present

## 2014-09-27 ENCOUNTER — Ambulatory Visit (INDEPENDENT_AMBULATORY_CARE_PROVIDER_SITE_OTHER): Payer: Medicare Other | Admitting: Family Medicine

## 2014-09-27 ENCOUNTER — Encounter: Payer: Self-pay | Admitting: Family Medicine

## 2014-09-27 VITALS — BP 98/62 | HR 78 | Temp 97.5°F | Resp 16 | Wt 176.0 lb

## 2014-09-27 DIAGNOSIS — E1169 Type 2 diabetes mellitus with other specified complication: Secondary | ICD-10-CM

## 2014-09-27 DIAGNOSIS — E119 Type 2 diabetes mellitus without complications: Secondary | ICD-10-CM | POA: Diagnosis not present

## 2014-09-27 DIAGNOSIS — I10 Essential (primary) hypertension: Secondary | ICD-10-CM | POA: Diagnosis not present

## 2014-09-27 DIAGNOSIS — E785 Hyperlipidemia, unspecified: Secondary | ICD-10-CM | POA: Diagnosis not present

## 2014-09-27 DIAGNOSIS — M5416 Radiculopathy, lumbar region: Secondary | ICD-10-CM | POA: Diagnosis not present

## 2014-09-27 DIAGNOSIS — Z23 Encounter for immunization: Secondary | ICD-10-CM | POA: Diagnosis not present

## 2014-09-27 DIAGNOSIS — M9905 Segmental and somatic dysfunction of pelvic region: Secondary | ICD-10-CM | POA: Diagnosis not present

## 2014-09-27 DIAGNOSIS — E669 Obesity, unspecified: Secondary | ICD-10-CM

## 2014-09-27 DIAGNOSIS — M9903 Segmental and somatic dysfunction of lumbar region: Secondary | ICD-10-CM | POA: Diagnosis not present

## 2014-09-27 DIAGNOSIS — M955 Acquired deformity of pelvis: Secondary | ICD-10-CM | POA: Diagnosis not present

## 2014-09-27 LAB — POCT GLYCOSYLATED HEMOGLOBIN (HGB A1C): Hemoglobin A1C: 7.6

## 2014-09-27 NOTE — Progress Notes (Signed)
Patient ID: Tracy Silva, female   DOB: 1940/03/30, 74 y.o.   MRN: [id number]    Subjective:  HPI  Diabetes Mellitus Type II, Follow-up:   Lab Results  Component Value Date   HGBA1C 7.8* 05/10/2014   Last seen for diabetes 4 months ago.  Management changes included none. She reports good compliance with treatment. She is not having side effects.  Current symptoms include none Home blood sugar records: 140's fasting  Episodes of hypoglycemia? no   Current Insulin Regimen: n/a Most Recent Eye Exam: more than a year Current exercise: none  ------------------------------------------------------------------------   Hypertension, follow-up:  BP Readings from Last 3 Encounters:  09/27/14 98/62  05/10/14 118/64  10/25/12 130/70    She was last seen for hypertension 4 months ago.  BP at that visit was 118/64. Management changes since that visit include none.She reports good compliance with treatment. She is not having side effects.  She is not exercising. She is adherent to low salt diet.   Outside blood pressures are not being checked. She is experiencing none.  Patient denies none.     ------------------------------------------------------------------------    Lipid/Cholesterol, Follow-up:   Last seen for this 4 months ago.  Management changes since that visit include none. Suggested start cholesterol medication. Pt wanted to wait and try diet and exercise and for [place] to follow kidney function for now.  Last Lipid Panel:    Component Value Date/Time   CHOL 309* 05/16/2014   TRIG 318* 05/16/2014   HDL 42 05/16/2014   LDLCALC 203 05/16/2014    She reports good compliance with treatment. She is not having side effects.   Wt Readings from Last 3 Encounters:  09/27/14 176 lb (79.833 kg)  05/10/14 176 lb (79.833 kg)  10/25/12 169 lb (76.658 kg)    ------------------------------------------------------------------------     Prior to Admission  medications   Medication Sig Start Date End Date Taking? Authorizing Provider  amLODipine-benazepril (LOTREL) 5-10 MG per capsule TAKE ONE CAPSULE DAILY 07/02/14  Yes Jerrol Banana., MD  amoxicillin-clavulanate (AUGMENTIN) 500-125 MG per tablet Take 1 tablet (500 mg total) by mouth 2 (two) times daily. 07/01/12  Yes Robert Bellow, MD  aspirin 81 MG tablet Take 81 mg by mouth daily.   Yes Historical Provider, MD  atorvastatin (LIPITOR) 10 MG tablet Take 10 mg by mouth daily.   Yes Historical Provider, MD  Cholecalciferol (VITAMIN D) 1000 UNITS capsule Take 1,000 Units by mouth daily.   Yes Historical Provider, MD  glipiZIDE (GLUCOTROL) 5 MG tablet Take by mouth. 05/28/14  Yes Historical Provider, MD  hydrochlorothiazide (MICROZIDE) 12.5 MG capsule Take 12.5 mg by mouth daily.   Yes Historical Provider, MD  levothyroxine (SYNTHROID, LEVOTHROID) 100 MCG tablet Take 100 mcg by mouth daily before breakfast.   Yes Historical Provider, MD  meloxicam (MOBIC) 7.5 MG tablet Take 7.5 mg by mouth 2 (two) times daily as needed for pain.   Yes Historical Provider, MD  metoprolol (TOPROL-XL) 200 MG 24 hr tablet TAKE 1 TABLET EVERY DAY 07/31/14  Yes Jerrol Banana., MD  Multiple Vitamins-Minerals (CENTRUM SILVER ADULT 50+ PO) Take 1 tablet by mouth daily.   Yes Historical Provider, MD  omeprazole (PRILOSEC OTC) 20 MG tablet Take 20 mg by mouth daily.   Yes Historical Provider, MD  sitaGLIPtin (JANUVIA) 100 MG tablet Take 100 mg by mouth daily.   Yes Historical Provider, MD    Patient Active Problem List   Diagnosis Date Noted  .  Acid reflux 09/07/2014  . AI (aortic incompetence) 09/07/2014  . Allergic rhinitis 09/07/2014  . Clinical depression 09/07/2014  . Diabetes mellitus type 2 in obese 09/07/2014  . Essential (primary) hypertension 09/07/2014  . H/O thyroid disease 09/07/2014  . Personal history of infectious and parasitic disease 09/07/2014  . H/O respiratory system disease 09/07/2014  .  HLD (hyperlipidemia) 09/07/2014  . Acquired hypothyroidism 09/07/2014  . Adiposity 09/07/2014  . Post menopausal syndrome 09/07/2014  . Basal cell papilloma 09/07/2014  . Avitaminosis D 09/07/2014  . Rectal bleeding 07/01/2012  . Other and unspecified noninfectious gastroenteritis and colitis(558.9) 06/27/2012  . Abnormal chest sounds 05/06/2010    Past Medical History  Diagnosis Date  . Hypertension   . Diabetes mellitus without complication   . Acid reflux   . High cholesterol   . Rectal bleeding 2011  . Other and unspecified noninfectious gastroenteritis and colitis(558.9) 06/27/2012    Social History   Social History  . Marital Status: Married    Spouse Name: N/A  . Number of Children: N/A  . Years of Education: N/A   Occupational History  . Not on file.   Social History Main Topics  . Smoking status: Former Research scientist (life sciences)  . Smokeless tobacco: Never Used     Comment: Stopped about 25 years ago  . Alcohol Use: Yes     Comment: occasionally with dinner  . Drug Use: No  . Sexual Activity: Not on file   Other Topics Concern  . Not on file   Social History Narrative    Allergies  Allergen Reactions  . Codeine     headaches    Review of Systems  Constitutional: Negative.   HENT: Negative.   Eyes: Negative.   Respiratory: Negative.   Cardiovascular: Negative.   Gastrointestinal: Negative.   Genitourinary: Negative.   Musculoskeletal: Negative.   Skin: Negative.   Neurological: Negative.   Endo/Heme/Allergies: Negative.   Psychiatric/Behavioral: Negative.   All other systems reviewed and are negative.   Immunization History  Administered Date(s) Administered  . Pneumococcal Conjugate-13 06/05/2013  . Td 12/01/2006   Objective:  BP 98/62 mmHg  Pulse 78  Temp(Src) 97.5 F (36.4 C) (Oral)  Resp 16  Wt 176 lb (79.833 kg)  Physical Exam  Constitutional: She is oriented to person, place, and time and well-developed, well-nourished, and in no distress.    HENT:  Head: Normocephalic and atraumatic.  Right Ear: External ear normal.  Left Ear: External ear normal.  Nose: Nose normal.  Eyes: Conjunctivae are normal.  Neck: Neck supple.  Cardiovascular: Normal rate, regular rhythm and normal heart sounds.   Pulmonary/Chest: Effort normal and breath sounds normal.  Abdominal: Soft.  Neurological: She is alert and oriented to person, place, and time.  Skin: Skin is warm and dry.  Psychiatric: Mood, memory, affect and judgment normal.    Lab Results  Component Value Date   WBC 5.0 05/16/2014   HGB 12.0 05/16/2014   HCT 36 05/16/2014   PLT 350 05/16/2014   GLUCOSE 192* 07/01/2012   CHOL 309* 05/16/2014   TRIG 318* 05/16/2014   HDL 42 05/16/2014   LDLCALC 203 05/16/2014   TSH 1.13 05/16/2014   HGBA1C 7.8* 05/10/2014    CMP     Component Value Date/Time   NA 142 05/16/2014   NA 137 07/01/2012 1250   K 4.7 05/16/2014   K 4.1 07/01/2012 1250   CL 106 07/01/2012 1250   CO2 24 07/01/2012 1250   GLUCOSE 192*  07/01/2012 1250   BUN 35* 05/16/2014   BUN 39* 07/01/2012 1250   CREATININE 1.7* 05/16/2014   CREATININE 1.94* 07/01/2012 1250   CALCIUM 9.1 07/01/2012 1250   AST 21 05/16/2014   ALT 20 05/16/2014   ALKPHOS 73 05/16/2014   GFRNONAA 25* 07/01/2012 1250   GFRAA 29* 07/01/2012 1250    Assessment and Plan :  1. Essential (primary) hypertension Excellent blood pressure control.   2. Diabetes mellitus type 2 in obese  - POCT HgB A1C--7.6% today.  3. HLD (hyperlipidemia) Samples of Zetia given, will recheck lipids in a 1 month. Patient is statin intolerant by history. 4. GERD 5. Hypothyroidism  Flu vaccine given.    Miguel Aschoff MD [facility] 09/27/2014 11:30 AM

## 2014-10-02 DIAGNOSIS — M9903 Segmental and somatic dysfunction of lumbar region: Secondary | ICD-10-CM | POA: Diagnosis not present

## 2014-10-02 DIAGNOSIS — M955 Acquired deformity of pelvis: Secondary | ICD-10-CM | POA: Diagnosis not present

## 2014-10-02 DIAGNOSIS — M9905 Segmental and somatic dysfunction of pelvic region: Secondary | ICD-10-CM | POA: Diagnosis not present

## 2014-10-02 DIAGNOSIS — M5416 Radiculopathy, lumbar region: Secondary | ICD-10-CM | POA: Diagnosis not present

## 2014-10-06 DIAGNOSIS — M955 Acquired deformity of pelvis: Secondary | ICD-10-CM | POA: Diagnosis not present

## 2014-10-06 DIAGNOSIS — M5416 Radiculopathy, lumbar region: Secondary | ICD-10-CM | POA: Diagnosis not present

## 2014-10-06 DIAGNOSIS — M9905 Segmental and somatic dysfunction of pelvic region: Secondary | ICD-10-CM | POA: Diagnosis not present

## 2014-10-06 DIAGNOSIS — M9903 Segmental and somatic dysfunction of lumbar region: Secondary | ICD-10-CM | POA: Diagnosis not present

## 2014-10-10 DIAGNOSIS — M9903 Segmental and somatic dysfunction of lumbar region: Secondary | ICD-10-CM | POA: Diagnosis not present

## 2014-10-10 DIAGNOSIS — M5416 Radiculopathy, lumbar region: Secondary | ICD-10-CM | POA: Diagnosis not present

## 2014-10-10 DIAGNOSIS — M9905 Segmental and somatic dysfunction of pelvic region: Secondary | ICD-10-CM | POA: Diagnosis not present

## 2014-10-10 DIAGNOSIS — M955 Acquired deformity of pelvis: Secondary | ICD-10-CM | POA: Diagnosis not present

## 2014-10-16 ENCOUNTER — Telehealth: Payer: Self-pay | Admitting: *Deleted

## 2014-10-16 NOTE — Telephone Encounter (Signed)
Contacted patient to reschedule recall follow up appointment for colonoscopy, patient did not want follow up at this time. Per patient will contact us in the future to reschedule.

## 2014-10-17 DIAGNOSIS — M9903 Segmental and somatic dysfunction of lumbar region: Secondary | ICD-10-CM | POA: Diagnosis not present

## 2014-10-17 DIAGNOSIS — M955 Acquired deformity of pelvis: Secondary | ICD-10-CM | POA: Diagnosis not present

## 2014-10-17 DIAGNOSIS — M9905 Segmental and somatic dysfunction of pelvic region: Secondary | ICD-10-CM | POA: Diagnosis not present

## 2014-10-17 DIAGNOSIS — M5416 Radiculopathy, lumbar region: Secondary | ICD-10-CM | POA: Diagnosis not present

## 2014-10-18 ENCOUNTER — Ambulatory Visit: Payer: Self-pay | Admitting: General Surgery

## 2014-10-24 DIAGNOSIS — M955 Acquired deformity of pelvis: Secondary | ICD-10-CM | POA: Diagnosis not present

## 2014-10-24 DIAGNOSIS — M9905 Segmental and somatic dysfunction of pelvic region: Secondary | ICD-10-CM | POA: Diagnosis not present

## 2014-10-24 DIAGNOSIS — M5416 Radiculopathy, lumbar region: Secondary | ICD-10-CM | POA: Diagnosis not present

## 2014-10-24 DIAGNOSIS — M9903 Segmental and somatic dysfunction of lumbar region: Secondary | ICD-10-CM | POA: Diagnosis not present

## 2014-10-25 ENCOUNTER — Ambulatory Visit (INDEPENDENT_AMBULATORY_CARE_PROVIDER_SITE_OTHER): Payer: Medicare Other | Admitting: Family Medicine

## 2014-10-25 ENCOUNTER — Ambulatory Visit: Payer: Medicare Other | Admitting: Family Medicine

## 2014-10-25 ENCOUNTER — Encounter: Payer: Self-pay | Admitting: Family Medicine

## 2014-10-25 VITALS — BP 102/64 | HR 80 | Temp 97.9°F | Resp 18 | Wt 178.0 lb

## 2014-10-25 DIAGNOSIS — Z8639 Personal history of other endocrine, nutritional and metabolic disease: Secondary | ICD-10-CM | POA: Diagnosis not present

## 2014-10-25 DIAGNOSIS — I10 Essential (primary) hypertension: Secondary | ICD-10-CM | POA: Diagnosis not present

## 2014-10-25 DIAGNOSIS — E785 Hyperlipidemia, unspecified: Secondary | ICD-10-CM

## 2014-10-25 NOTE — Progress Notes (Signed)
Patient ID: Tracy Silva, female   DOB: 11-Apr-1940, 74 y.o.   MRN: [id number]    Subjective:  HPI  Hyperlipidemia follow up: Patient is here to follow up on cholesterol. She is statin intolerant so we started her on Zetia which she has been able to tolerate well. Her las lipid level was in April and has not been re checked on the new medication. Lab Results  Component Value Date   CHOL 309* 05/16/2014   HDL 42 05/16/2014   LDLCALC 203 05/16/2014   TRIG 318* 05/16/2014     Prior to Admission medications   Medication Sig Start Date End Date Taking? Authorizing Provider  amLODipine-benazepril (LOTREL) 5-10 MG per capsule TAKE ONE CAPSULE DAILY 07/02/14  Yes Jerrol Banana., MD  amoxicillin-clavulanate (AUGMENTIN) 500-125 MG per tablet Take 1 tablet (500 mg total) by mouth 2 (two) times daily. 07/01/12  Yes Robert Bellow, MD  aspirin 81 MG tablet Take 81 mg by mouth daily.   Yes Historical Provider, MD  Cholecalciferol (VITAMIN D) 1000 UNITS capsule Take 1,000 Units by mouth daily.   Yes Historical Provider, MD  glipiZIDE (GLUCOTROL) 5 MG tablet Take by mouth. 05/28/14  Yes Historical Provider, MD  hydrochlorothiazide (MICROZIDE) 12.5 MG capsule Take 12.5 mg by mouth daily.   Yes Historical Provider, MD  levothyroxine (SYNTHROID, LEVOTHROID) 100 MCG tablet Take 100 mcg by mouth daily before breakfast.   Yes Historical Provider, MD  meloxicam (MOBIC) 7.5 MG tablet Take 7.5 mg by mouth 2 (two) times daily as needed for pain.   Yes Historical Provider, MD  metoprolol (TOPROL-XL) 200 MG 24 hr tablet TAKE 1 TABLET EVERY DAY 07/31/14  Yes Jerrol Banana., MD  Multiple Vitamins-Minerals (CENTRUM SILVER ADULT 50+ PO) Take 1 tablet by mouth daily.   Yes Historical Provider, MD  omeprazole (PRILOSEC OTC) 20 MG tablet Take 20 mg by mouth daily.   Yes Historical Provider, MD  sitaGLIPtin (JANUVIA) 100 MG tablet Take 100 mg by mouth daily.   Yes Historical Provider, MD    Patient Active  Problem List   Diagnosis Date Noted  . Acid reflux 09/07/2014  . AI (aortic incompetence) 09/07/2014  . Allergic rhinitis 09/07/2014  . Clinical depression 09/07/2014  . Diabetes mellitus type 2 in obese 09/07/2014  . Essential (primary) hypertension 09/07/2014  . H/O thyroid disease 09/07/2014  . Personal history of infectious and parasitic disease 09/07/2014  . H/O respiratory system disease 09/07/2014  . HLD (hyperlipidemia) 09/07/2014  . Acquired hypothyroidism 09/07/2014  . Adiposity 09/07/2014  . Post menopausal syndrome 09/07/2014  . Basal cell papilloma 09/07/2014  . Avitaminosis D 09/07/2014  . Rectal bleeding 07/01/2012  . Other and unspecified noninfectious gastroenteritis and colitis(558.9) 06/27/2012  . Abnormal chest sounds 05/06/2010    Past Medical History  Diagnosis Date  . Hypertension   . Diabetes mellitus without complication   . Acid reflux   . High cholesterol   . Rectal bleeding 2011  . Other and unspecified noninfectious gastroenteritis and colitis(558.9) 06/27/2012    Social History   Social History  . Marital Status: Married    Spouse Name: N/A  . Number of Children: N/A  . Years of Education: N/A   Occupational History  . Not on file.   Social History Main Topics  . Smoking status: Former Research scientist (life sciences)  . Smokeless tobacco: Never Used     Comment: Stopped about 25 years ago  . Alcohol Use: Yes     Comment:  occasionally with dinner  . Drug Use: No  . Sexual Activity: Not on file   Other Topics Concern  . Not on file   Social History Narrative    Allergies  Allergen Reactions  . Codeine     headaches    Review of Systems  Constitutional: Negative.   Eyes: Negative.   Respiratory: Negative.   Cardiovascular: Negative.   Musculoskeletal: Negative.   Neurological: Negative.   Psychiatric/Behavioral: Negative.     Immunization History  Administered Date(s) Administered  . Influenza, High Dose Seasonal PF 09/27/2014  .  Pneumococcal Conjugate-13 06/05/2013  . Td 12/01/2006   Objective:  BP 102/64 mmHg  Pulse 80  Temp(Src) 97.9 F (36.6 C)  Resp 18  Wt 178 lb (80.74 kg)  Physical Exam  Constitutional: She is oriented to person, place, and time and well-developed, well-nourished, and in no distress.  HENT:  Head: Normocephalic.  Right Ear: External ear normal.  Left Ear: External ear normal.  Nose: Nose normal.  Eyes: Conjunctivae are normal.  Neck: Neck supple.  Cardiovascular: Normal rate, regular rhythm and normal heart sounds.   Pulmonary/Chest: Effort normal.  Neurological: She is alert and oriented to person, place, and time.  Skin: Skin is warm and dry.  Psychiatric: Mood, memory, affect and judgment normal.    Lab Results  Component Value Date   WBC 5.0 05/16/2014   HGB 12.0 05/16/2014   HCT 36 05/16/2014   PLT 350 05/16/2014   GLUCOSE 192* 07/01/2012   CHOL 309* 05/16/2014   TRIG 318* 05/16/2014   HDL 42 05/16/2014   LDLCALC 203 05/16/2014   TSH 1.13 05/16/2014   HGBA1C 7.6 09/27/2014    CMP     Component Value Date/Time   NA 142 05/16/2014   NA 137 07/01/2012 1250   K 4.7 05/16/2014   K 4.1 07/01/2012 1250   CL 106 07/01/2012 1250   CO2 24 07/01/2012 1250   GLUCOSE 192* 07/01/2012 1250   BUN 35* 05/16/2014   BUN 39* 07/01/2012 1250   CREATININE 1.7* 05/16/2014   CREATININE 1.94* 07/01/2012 1250   CALCIUM 9.1 07/01/2012 1250   AST 21 05/16/2014   ALT 20 05/16/2014   ALKPHOS 73 05/16/2014   GFRNONAA 25* 07/01/2012 1250   GFRAA 29* 07/01/2012 1250    Assessment and Plan :   1. H/O thyroid disease  - TSH  2. HLD (hyperlipidemia) - Lipid Panel With LDL/HDL Ratio  3. Essential (primary) hypertension    Miguel Aschoff MD [facility] 10/25/2014 3:12 PM

## 2014-11-01 DIAGNOSIS — E785 Hyperlipidemia, unspecified: Secondary | ICD-10-CM | POA: Diagnosis not present

## 2014-11-01 DIAGNOSIS — Z8639 Personal history of other endocrine, nutritional and metabolic disease: Secondary | ICD-10-CM | POA: Diagnosis not present

## 2014-11-02 LAB — LIPID PANEL WITH LDL/HDL RATIO
Cholesterol, Total: 269 mg/dL — ABNORMAL HIGH (ref 100–199)
HDL: 45 mg/dL (ref >39)
LDL Calculated: 163 mg/dL — ABNORMAL HIGH (ref 0–99)
LDl/HDL Ratio: 3.6 ratio units — ABNORMAL HIGH (ref 0.0–3.2)
Triglycerides: 306 mg/dL — ABNORMAL HIGH (ref 0–149)
VLDL CHOLESTEROL CAL: 61 mg/dL — AB (ref 5–40)

## 2014-11-02 LAB — TSH: TSH: 0.802 u[IU]/mL (ref 0.450–4.500)

## 2014-11-07 DIAGNOSIS — M955 Acquired deformity of pelvis: Secondary | ICD-10-CM | POA: Diagnosis not present

## 2014-11-07 DIAGNOSIS — M5416 Radiculopathy, lumbar region: Secondary | ICD-10-CM | POA: Diagnosis not present

## 2014-11-07 DIAGNOSIS — M9905 Segmental and somatic dysfunction of pelvic region: Secondary | ICD-10-CM | POA: Diagnosis not present

## 2014-11-07 DIAGNOSIS — M9903 Segmental and somatic dysfunction of lumbar region: Secondary | ICD-10-CM | POA: Diagnosis not present

## 2014-12-07 DIAGNOSIS — M9903 Segmental and somatic dysfunction of lumbar region: Secondary | ICD-10-CM | POA: Diagnosis not present

## 2014-12-07 DIAGNOSIS — M955 Acquired deformity of pelvis: Secondary | ICD-10-CM | POA: Diagnosis not present

## 2014-12-07 DIAGNOSIS — M9905 Segmental and somatic dysfunction of pelvic region: Secondary | ICD-10-CM | POA: Diagnosis not present

## 2014-12-07 DIAGNOSIS — M5416 Radiculopathy, lumbar region: Secondary | ICD-10-CM | POA: Diagnosis not present

## 2014-12-13 ENCOUNTER — Encounter: Payer: Self-pay | Admitting: *Deleted

## 2015-01-02 ENCOUNTER — Other Ambulatory Visit: Payer: Self-pay | Admitting: Family Medicine

## 2015-01-02 DIAGNOSIS — M955 Acquired deformity of pelvis: Secondary | ICD-10-CM | POA: Diagnosis not present

## 2015-01-02 DIAGNOSIS — M9903 Segmental and somatic dysfunction of lumbar region: Secondary | ICD-10-CM | POA: Diagnosis not present

## 2015-01-02 DIAGNOSIS — M9905 Segmental and somatic dysfunction of pelvic region: Secondary | ICD-10-CM | POA: Diagnosis not present

## 2015-01-02 DIAGNOSIS — M5416 Radiculopathy, lumbar region: Secondary | ICD-10-CM | POA: Diagnosis not present

## 2015-01-24 ENCOUNTER — Ambulatory Visit: Payer: Medicare Other | Admitting: Family Medicine

## 2015-01-29 ENCOUNTER — Ambulatory Visit (INDEPENDENT_AMBULATORY_CARE_PROVIDER_SITE_OTHER): Payer: Medicare Other | Admitting: Family Medicine

## 2015-01-29 VITALS — BP 128/60 | HR 72 | Temp 97.7°F | Resp 16 | Wt 178.0 lb

## 2015-01-29 DIAGNOSIS — I10 Essential (primary) hypertension: Secondary | ICD-10-CM

## 2015-01-29 DIAGNOSIS — M791 Myalgia, unspecified site: Secondary | ICD-10-CM

## 2015-01-29 DIAGNOSIS — E1169 Type 2 diabetes mellitus with other specified complication: Secondary | ICD-10-CM

## 2015-01-29 DIAGNOSIS — E669 Obesity, unspecified: Secondary | ICD-10-CM

## 2015-01-29 DIAGNOSIS — E119 Type 2 diabetes mellitus without complications: Secondary | ICD-10-CM

## 2015-01-29 NOTE — Progress Notes (Signed)
Patient ID: Tracy Silva, female   DOB: 05-02-1940, 75 y.o.   MRN: [id number]   COVE IGLEHEART  MRN: [id number] DOB: [birth year]  Subjective:  HPI  1. Essential (primary) hypertension The patient is a 75 year old female who presents for follow up of her hypertension.  Her last visit was 10/25/14 and her blood pressure at that time was 102/64.  No changes in management were made.  Patient is compliant with her medication without complaints or complications.  2. Diabetes mellitus type 2 in obese [facility]) The patient is also here for follow up of her diabetes.  She occasionally checks her glucose at home but does not have a report of these readings.  She is compliant with her medications and has had no complaints or complications.  Her last A1C was done on 09/27/14 and it was 7.6.    Patient is due for her A1C today and also, she has not had a metobolic panel or CBC since April 16.   Patient Active Problem List   Diagnosis Date Noted  . Acid reflux 09/07/2014  . AI (aortic incompetence) 09/07/2014  . Allergic rhinitis 09/07/2014  . Clinical depression 09/07/2014  . Diabetes mellitus type 2 in obese ([place]) 09/07/2014  . Essential (primary) hypertension 09/07/2014  . H/O thyroid disease 09/07/2014  . Personal history of infectious and parasitic disease 09/07/2014  . H/O respiratory system disease 09/07/2014  . HLD (hyperlipidemia) 09/07/2014  . Acquired hypothyroidism 09/07/2014  . Adiposity 09/07/2014  . Post menopausal syndrome 09/07/2014  . Basal cell papilloma 09/07/2014  . Avitaminosis D 09/07/2014  . Rectal bleeding 07/01/2012  . Other and unspecified noninfectious gastroenteritis and colitis(558.9) 06/27/2012  . Abnormal chest sounds 05/06/2010    Past Medical History  Diagnosis Date  . Hypertension   . Diabetes mellitus without complication   . Acid reflux   . High cholesterol   . Rectal bleeding 2011  . Other and unspecified noninfectious gastroenteritis and colitis(558.9)  06/27/2012    Social History   Social History  . Marital Status: Married    Spouse Name: N/A  . Number of Children: N/A  . Years of Education: N/A   Occupational History  . Not on file.   Social History Main Topics  . Smoking status: Former Research scientist (life sciences)  . Smokeless tobacco: Never Used     Comment: Stopped about 25 years ago  . Alcohol Use: Yes     Comment: occasionally with dinner  . Drug Use: No  . Sexual Activity: Not on file   Other Topics Concern  . Not on file   Social History Narrative    Outpatient Prescriptions Prior to Visit  Medication Sig Dispense Refill  . amLODipine-benazepril (LOTREL) 5-10 MG capsule TAKE ONE CAPSULE EVERYDAY FOR BLOOD PRESSURE 30 capsule 5  . aspirin 81 MG tablet Take 81 mg by mouth daily.    . Cholecalciferol (VITAMIN D) 1000 UNITS capsule Take 1,000 Units by mouth daily.    Marland Kitchen glipiZIDE (GLUCOTROL) 5 MG tablet Take by mouth.    . hydrochlorothiazide (MICROZIDE) 12.5 MG capsule Take 12.5 mg by mouth daily.    Marland Kitchen JANUVIA 100 MG tablet TAKE 1 TABLET EVERY DAY FOR SUGAR CONTROL 30 tablet 5  . levothyroxine (SYNTHROID, LEVOTHROID) 100 MCG tablet Take 100 mcg by mouth daily before breakfast.    . meloxicam (MOBIC) 7.5 MG tablet Take 7.5 mg by mouth 2 (two) times daily as needed for pain.    . metoprolol (TOPROL-XL) 200 MG 24  hr tablet TAKE 1 TABLET EVERY DAY 30 tablet 12  . Multiple Vitamins-Minerals (CENTRUM SILVER ADULT 50+ PO) Take 1 tablet by mouth daily.    Marland Kitchen omeprazole (PRILOSEC OTC) 20 MG tablet Take 20 mg by mouth daily.    Marland Kitchen amoxicillin-clavulanate (AUGMENTIN) 500-125 MG per tablet Take 1 tablet (500 mg total) by mouth 2 (two) times daily. 20 tablet 0   No facility-administered medications prior to visit.   Outpatient Encounter Prescriptions as of 01/29/2015  Medication Sig Note  . amLODipine-benazepril (LOTREL) 5-10 MG capsule TAKE ONE CAPSULE EVERYDAY FOR BLOOD PRESSURE   . aspirin 81 MG tablet Take 81 mg by mouth daily.   .  Cholecalciferol (VITAMIN D) 1000 UNITS capsule Take 1,000 Units by mouth daily.   Marland Kitchen glipiZIDE (GLUCOTROL) 5 MG tablet Take by mouth. 09/07/2014: Received from: [facility]  . hydrochlorothiazide (MICROZIDE) 12.5 MG capsule Take 12.5 mg by mouth daily.   Marland Kitchen JANUVIA 100 MG tablet TAKE 1 TABLET EVERY DAY FOR SUGAR CONTROL   . levothyroxine (SYNTHROID, LEVOTHROID) 100 MCG tablet Take 100 mcg by mouth daily before breakfast.   . meloxicam (MOBIC) 7.5 MG tablet Take 7.5 mg by mouth 2 (two) times daily as needed for pain.   . metoprolol (TOPROL-XL) 200 MG 24 hr tablet TAKE 1 TABLET EVERY DAY   . Multiple Vitamins-Minerals (CENTRUM SILVER ADULT 50+ PO) Take 1 tablet by mouth daily.   Marland Kitchen omeprazole (PRILOSEC OTC) 20 MG tablet Take 20 mg by mouth daily.   . [DISCONTINUED] amoxicillin-clavulanate (AUGMENTIN) 500-125 MG per tablet Take 1 tablet (500 mg total) by mouth 2 (two) times daily.    No facility-administered encounter medications on file as of 01/29/2015.   Allergies  Allergen Reactions  . Codeine     headaches    Review of Systems  Constitutional: Negative for fever, chills, weight loss and malaise/fatigue.  Eyes: Negative.   Respiratory: Negative for cough, hemoptysis, sputum production, shortness of breath and wheezing.   Cardiovascular: Negative for chest pain, palpitations and leg swelling.  Gastrointestinal: Negative.   Skin: Negative.   Endo/Heme/Allergies: Negative for polydipsia.  Psychiatric/Behavioral: Negative.    Objective:  BP 128/60 mmHg  Pulse 72  Temp(Src) 97.7 F (36.5 C) (Oral)  Resp 16  Wt 178 lb (80.74 kg)  Physical Exam  Constitutional: She is well-developed, well-nourished, and in no distress.  HENT:  Head: Normocephalic and atraumatic.  Eyes: Pupils are equal, round, and reactive to light.  Neck: Normal range of motion. Neck supple.  Small left sided goiter.  Cardiovascular: Normal rate, regular rhythm and normal heart sounds.   II/VI  systolic murmur   Pulmonary/Chest: Effort normal and breath sounds normal.  Skin: Skin is warm and dry.  Psychiatric: Mood, memory, affect and judgment normal.    Assessment and Plan :   1. Essential (primary) hypertension Stable, continue current medications. - CBC With Differential/Platelet - COMPLETE METABOLIC PANEL WITH GFR  2. Diabetes mellitus type 2 in obese ([place]) Will await results of A1C - Hemoglobin A1c  3. Myalgia Patient had myalgias on statin medicines.   Diet and exercise stressed in statin intolerant patients.   Miguel Aschoff MD [facility] 01/29/2015 11:20 AM

## 2015-01-31 ENCOUNTER — Other Ambulatory Visit: Payer: Self-pay | Admitting: Family Medicine

## 2015-01-31 DIAGNOSIS — M5416 Radiculopathy, lumbar region: Secondary | ICD-10-CM | POA: Diagnosis not present

## 2015-01-31 DIAGNOSIS — M955 Acquired deformity of pelvis: Secondary | ICD-10-CM | POA: Diagnosis not present

## 2015-01-31 DIAGNOSIS — M9905 Segmental and somatic dysfunction of pelvic region: Secondary | ICD-10-CM | POA: Diagnosis not present

## 2015-01-31 DIAGNOSIS — M9903 Segmental and somatic dysfunction of lumbar region: Secondary | ICD-10-CM | POA: Diagnosis not present

## 2015-01-31 LAB — HEMOGLOBIN A1C
Est. average glucose Bld gHb Est-mCnc: 180 mg/dL
Hgb A1c MFr Bld: 7.9 % — ABNORMAL HIGH (ref 4.8–5.6)

## 2015-02-01 ENCOUNTER — Telehealth: Payer: Self-pay

## 2015-02-01 LAB — CBC WITH DIFFERENTIAL/PLATELET
BASOS: 1 %
Basophils Absolute: 0.1 10*3/uL (ref 0.0–0.2)
EOS (ABSOLUTE): 0.3 10*3/uL (ref 0.0–0.4)
EOS: 5 %
Hematocrit: 36.8 % (ref 34.0–46.6)
Hemoglobin: 12 g/dL (ref 11.1–15.9)
IMMATURE GRANS (ABS): 0 10*3/uL (ref 0.0–0.1)
IMMATURE GRANULOCYTES: 0 %
LYMPHS: 29 %
Lymphocytes Absolute: 1.6 10*3/uL (ref 0.7–3.1)
MCH: 32 pg (ref 26.6–33.0)
MCHC: 32.6 g/dL (ref 31.5–35.7)
MCV: 98 fL — ABNORMAL HIGH (ref 79–97)
Monocytes Absolute: 0.5 10*3/uL (ref 0.1–0.9)
Monocytes: 9 %
NEUTROS PCT: 56 %
Neutrophils Absolute: 3.1 10*3/uL (ref 1.4–7.0)
PLATELETS: 325 10*3/uL (ref 150–379)
RBC: 3.75 x10E6/uL — ABNORMAL LOW (ref 3.77–5.28)
RDW: 14.6 % (ref 12.3–15.4)
WBC: 5.6 10*3/uL (ref 3.4–10.8)

## 2015-02-01 LAB — SPECIMEN STATUS REPORT

## 2015-02-01 NOTE — Telephone Encounter (Signed)
-----   Message from Jerrol Banana., MD sent at 02/01/2015  2:40 PM EST ----- CBC ok--DM stable

## 2015-02-01 NOTE — Telephone Encounter (Signed)
No answer-aa

## 2015-02-05 NOTE — Telephone Encounter (Signed)
Tried calling patient and no answer. Will try again later.  

## 2015-02-06 NOTE — Telephone Encounter (Signed)
Attempted to contact patient. No answer nor voicemail.  

## 2015-02-08 NOTE — Telephone Encounter (Signed)
Tried calling patient no answer. Will mail letter saying to call office for lab results.

## 2015-03-01 DIAGNOSIS — M9903 Segmental and somatic dysfunction of lumbar region: Secondary | ICD-10-CM | POA: Diagnosis not present

## 2015-03-01 DIAGNOSIS — M9905 Segmental and somatic dysfunction of pelvic region: Secondary | ICD-10-CM | POA: Diagnosis not present

## 2015-03-01 DIAGNOSIS — M5416 Radiculopathy, lumbar region: Secondary | ICD-10-CM | POA: Diagnosis not present

## 2015-03-01 DIAGNOSIS — M955 Acquired deformity of pelvis: Secondary | ICD-10-CM | POA: Diagnosis not present

## 2015-05-20 ENCOUNTER — Other Ambulatory Visit: Payer: Self-pay | Admitting: Family Medicine

## 2015-05-29 ENCOUNTER — Encounter: Payer: Self-pay | Admitting: Family Medicine

## 2015-05-29 ENCOUNTER — Ambulatory Visit (INDEPENDENT_AMBULATORY_CARE_PROVIDER_SITE_OTHER): Payer: Medicare Other | Admitting: Family Medicine

## 2015-05-29 VITALS — BP 112/72 | HR 72 | Temp 98.0°F | Resp 16 | Ht 65.0 in | Wt 178.0 lb

## 2015-05-29 DIAGNOSIS — E785 Hyperlipidemia, unspecified: Secondary | ICD-10-CM | POA: Diagnosis not present

## 2015-05-29 DIAGNOSIS — I10 Essential (primary) hypertension: Secondary | ICD-10-CM | POA: Diagnosis not present

## 2015-05-29 DIAGNOSIS — E669 Obesity, unspecified: Secondary | ICD-10-CM

## 2015-05-29 DIAGNOSIS — E1169 Type 2 diabetes mellitus with other specified complication: Secondary | ICD-10-CM

## 2015-05-29 DIAGNOSIS — E119 Type 2 diabetes mellitus without complications: Secondary | ICD-10-CM

## 2015-05-29 LAB — POCT GLYCOSYLATED HEMOGLOBIN (HGB A1C)
ESTIMATED AVERAGE GLUCOSE: 189
Hemoglobin A1C: 8.2

## 2015-05-29 NOTE — Progress Notes (Signed)
Patient ID: Tracy Silva, female   DOB: 1940-12-02, 75 y.o.   MRN: [id number]       Patient: Tracy Silva Female    DOB: [birth year]   75 y.o.   MRN: [id number] Visit Date: 05/29/2015  Today's Provider: Wilhemena Durie, MD   Chief Complaint  Patient presents with  . Hypertension  . Diabetes  . Hyperlipidemia   Subjective:    HPI  Hypertension, follow-up:  BP Readings from Last 3 Encounters:  05/29/15 112/72  01/29/15 128/60  10/25/14 102/64    She was last seen for hypertension 4 months ago.  BP at that visit was 128/60. Management since that visit includes no changes. She reports good compliance with treatment. She is not having side effects.  She is not exercising. She is adherent to low salt diet.   Outside blood pressures are not being checked. Patient denies chest pain, chest pressure/discomfort, fatigue, lower extremity edema and tachypnea.    Weight trend: stable Wt Readings from Last 3 Encounters:  05/29/15 178 lb (80.74 kg)  01/29/15 178 lb (80.74 kg)  10/25/14 178 lb (80.74 kg)    Current diet: well balanced      Diabetes Mellitus Type II, Follow-up:   Lab Results  Component Value Date   HGBA1C 8.2 05/29/2015   HGBA1C 7.9* 01/29/2015   HGBA1C 7.6 09/27/2014    Last seen for diabetes 4 months ago.  Management since then includes no changes. She reports good compliance with treatment. She is not having side effects.  Home blood sugar records: not checked regularly.   Episodes of hypoglycemia? no   Current Insulin Regimen: none Most Recent Eye Exam: over 2 years.  Weight trend: stable Prior visit with dietician: no Current diet: well balanced Current exercise: none  Pertinent Labs:    Component Value Date/Time   CHOL 269* 11/01/2014 0857   CHOL 309* 05/16/2014   TRIG 306* 11/01/2014 0857   HDL 45 11/01/2014 0857   HDL 42 05/16/2014   LDLCALC 163* 11/01/2014 0857   LDLCALC 203 05/16/2014   CREATININE 1.7* 05/16/2014   CREATININE 1.94* 07/01/2012 1250    Wt Readings from Last 3 Encounters:  05/29/15 178 lb (80.74 kg)  01/29/15 178 lb (80.74 kg)  10/25/14 178 lb (80.74 kg)      Lipid/Cholesterol, Follow-up:   Last seen for this4 months ago.  Management changes since that visit include no changes. . Last Lipid Panel:    Component Value Date/Time   CHOL 269* 11/01/2014 0857   CHOL 309* 05/16/2014   TRIG 306* 11/01/2014 0857   HDL 45 11/01/2014 0857   HDL 42 05/16/2014   LDLCALC 163* 11/01/2014 0857   LDLCALC 203 05/16/2014    She reports good compliance with treatment. She is not having side effects.  Weight trend: stable Prior visit with dietician: no Current diet: well balanced Current exercise: none  Wt Readings from Last 3 Encounters:  05/29/15 178 lb (80.74 kg)  01/29/15 178 lb (80.74 kg)  10/25/14 178 lb (80.74 kg)           Allergies  Allergen Reactions  . Codeine     headaches   Previous Medications   AMLODIPINE-BENAZEPRIL (LOTREL) 5-10 MG CAPSULE    TAKE ONE CAPSULE EVERYDAY FOR BLOOD PRESSURE   ASPIRIN 81 MG TABLET    Take 81 mg by mouth daily.   CHOLECALCIFEROL (VITAMIN D) 1000 UNITS CAPSULE    Take 1,000 Units by mouth daily.   GLIPIZIDE (GLUCOTROL)  5 MG TABLET    Take by mouth.   HYDROCHLOROTHIAZIDE (MICROZIDE) 12.5 MG CAPSULE    TAKE ONE CAPSULE DAILY FOR BLOOD PRESSURE/FLUID   JANUVIA 100 MG TABLET    TAKE 1 TABLET EVERY DAY FOR SUGAR CONTROL   LEVOTHYROXINE (SYNTHROID, LEVOTHROID) 100 MCG TABLET    TAKE 1 TABLET EVERY DAY FOR THYROID   MELOXICAM (MOBIC) 7.5 MG TABLET    Take 7.5 mg by mouth 2 (two) times daily as needed for pain.   METOPROLOL (TOPROL-XL) 200 MG 24 HR TABLET    TAKE 1 TABLET EVERY DAY   MULTIPLE VITAMINS-MINERALS (CENTRUM SILVER ADULT 50+ PO)    Take 1 tablet by mouth daily.   OMEPRAZOLE (PRILOSEC OTC) 20 MG TABLET    Take 20 mg by mouth daily.    Review of Systems  Constitutional: Negative.   Respiratory: Negative.   Cardiovascular:  Negative.   Endocrine: Negative.   Musculoskeletal: Negative.   Neurological: Negative.     Social History  Substance Use Topics  . Smoking status: Former Research scientist (life sciences)  . Smokeless tobacco: Never Used     Comment: Stopped about 25 years ago  . Alcohol Use: Yes     Comment: occasionally with dinner   Objective:   BP 112/72 mmHg  Pulse 72  Temp(Src) 98 F (36.7 C)  Resp 16  Ht 5\' 5"  (1.651 m)  Wt 178 lb (80.74 kg)  BMI 29.62 kg/m2  Physical Exam  Constitutional: She is oriented to person, place, and time. She appears well-developed and well-nourished.  HENT:  Head: Normocephalic and atraumatic.  Right Ear: External ear normal.  Left Ear: External ear normal.  Nose: Nose normal.  Eyes: Conjunctivae are normal.  Neck: Neck supple.  Cardiovascular: Normal rate, regular rhythm and normal heart sounds.   Pulmonary/Chest: Effort normal and breath sounds normal.  Abdominal: Soft.  Neurological: She is alert and oriented to person, place, and time.  Skin: Skin is warm and dry.  Psychiatric: She has a normal mood and affect. Her behavior is normal. Judgment and thought content normal.        Assessment & Plan:     1. Essential (primary) hypertension   2. Diabetes mellitus type 2 in obese ([facility]) Diet and exercise stressed. - POCT glycosylated hemoglobin (Hb A1C)--8.2 today  3. HLD (hyperlipidemia) I have done the exam and reviewed the above chart and it is accurate to the best of my knowledge.         Lauren Aguayo Cranford Mon, MD  [facility]

## 2015-05-31 DIAGNOSIS — M9903 Segmental and somatic dysfunction of lumbar region: Secondary | ICD-10-CM | POA: Diagnosis not present

## 2015-05-31 DIAGNOSIS — M9905 Segmental and somatic dysfunction of pelvic region: Secondary | ICD-10-CM | POA: Diagnosis not present

## 2015-05-31 DIAGNOSIS — M955 Acquired deformity of pelvis: Secondary | ICD-10-CM | POA: Diagnosis not present

## 2015-05-31 DIAGNOSIS — M5416 Radiculopathy, lumbar region: Secondary | ICD-10-CM | POA: Diagnosis not present

## 2015-06-04 ENCOUNTER — Other Ambulatory Visit: Payer: Self-pay | Admitting: Family Medicine

## 2015-07-04 ENCOUNTER — Other Ambulatory Visit: Payer: Self-pay | Admitting: Family Medicine

## 2015-07-10 ENCOUNTER — Other Ambulatory Visit: Payer: Self-pay | Admitting: Family Medicine

## 2015-07-25 ENCOUNTER — Ambulatory Visit (INDEPENDENT_AMBULATORY_CARE_PROVIDER_SITE_OTHER): Payer: Medicare Other | Admitting: Family Medicine

## 2015-07-25 ENCOUNTER — Encounter: Payer: Self-pay | Admitting: Family Medicine

## 2015-07-25 VITALS — BP 128/70 | HR 80 | Temp 98.3°F | Resp 16 | Ht 65.0 in | Wt 178.0 lb

## 2015-07-25 DIAGNOSIS — L259 Unspecified contact dermatitis, unspecified cause: Secondary | ICD-10-CM

## 2015-07-25 NOTE — Patient Instructions (Signed)
Discussed use of antibiotic ointment combined in equal amounts with hydrocortisone ointment once or twice daily.

## 2015-07-25 NOTE — Progress Notes (Signed)
Subjective:     Patient ID: Tracy Silva, female   DOB: [birth year], 75 y.o.   MRN: [id number]  HPI  Chief Complaint  Patient presents with  . Cyst  States she has an irritated area on her left buttock which she first noticed 3 days ago. Denies prior hx of cyst or boils. Regularly uses an incontinence pad and has had tape/adhesive reactions in the past.   Review of Systems     Objective:   Physical Exam  Constitutional: She appears well-developed and well-nourished. No distress.  Skin:  Left inner buttock with oval area of erythema with involuting vesicles. No pustules or underlying induration. Triple antibiotic applied.       Assessment:    1. Contact dermatitis: ? Reaction to tape adhesive or simple abrasion.    Plan:    Discussed use of triple abx ointment (samples x 3 provided) with hydrocortisone ointment once or twice daily.

## 2015-07-31 ENCOUNTER — Other Ambulatory Visit: Payer: Self-pay | Admitting: Family Medicine

## 2015-08-01 ENCOUNTER — Ambulatory Visit (INDEPENDENT_AMBULATORY_CARE_PROVIDER_SITE_OTHER): Payer: Medicare Other | Admitting: Family Medicine

## 2015-08-01 VITALS — BP 136/68 | HR 76 | Temp 97.8°F | Resp 16 | Wt 178.0 lb

## 2015-08-01 DIAGNOSIS — L259 Unspecified contact dermatitis, unspecified cause: Secondary | ICD-10-CM

## 2015-08-01 NOTE — Progress Notes (Signed)
Tracy Silva  MRN: [id number] DOB: June 28, 1940  Subjective:  HPI   The patient is a 75 year old female who presents for follow up of a rash on her buttocks.  She was seen on 07/25/15 by Mariel Sleet PA.  At that time the patient thought it was coming from an allergic reaction to the adhesive from a sanitary pad.  The diagnosis that was charted was possible contact dermatitis versus allergic reaction versus simple abrasion.  She was instructed to use a triple antibiotic ointment and hydrocortisone ointment, which she has been doing. The patient has had the rash now for about 8-9 days.   The patient reports it is slightly better but, she became concerned that it may be shingles.  She states that the rash hurts more than itches and it is only on one side.  It is of of that the patient has had shingles in the past.  Patient Active Problem List   Diagnosis Date Noted  . Acid reflux 09/07/2014  . AI (aortic incompetence) 09/07/2014  . Allergic rhinitis 09/07/2014  . Clinical depression 09/07/2014  . Diabetes mellitus type 2 in obese ([place]) 09/07/2014  . Essential (primary) hypertension 09/07/2014  . H/O thyroid disease 09/07/2014  . Personal history of infectious and parasitic disease 09/07/2014  . H/O respiratory system disease 09/07/2014  . HLD (hyperlipidemia) 09/07/2014  . Acquired hypothyroidism 09/07/2014  . Adiposity 09/07/2014  . Post menopausal syndrome 09/07/2014  . Basal cell papilloma 09/07/2014  . Avitaminosis D 09/07/2014  . Rectal bleeding 07/01/2012  . Other and unspecified noninfectious gastroenteritis and colitis(558.9) 06/27/2012  . Abnormal chest sounds 05/06/2010    Past Medical History  Diagnosis Date  . Hypertension   . Diabetes mellitus without complication ([place])   . Acid reflux   . High cholesterol   . Rectal bleeding 2011  . Other and unspecified noninfectious gastroenteritis and colitis(558.9) 06/27/2012    Social History   Social History  . Marital  Status: Married    Spouse Name: N/A  . Number of Children: N/A  . Years of Education: N/A   Occupational History  . Not on file.   Social History Main Topics  . Smoking status: Former Research scientist (life sciences)  . Smokeless tobacco: Never Used     Comment: Stopped about 25 years ago  . Alcohol Use: Yes     Comment: occasionally with dinner  . Drug Use: No  . Sexual Activity: Not on file   Other Topics Concern  . Not on file   Social History Narrative    Outpatient Prescriptions Prior to Visit  Medication Sig Dispense Refill  . amLODipine-benazepril (LOTREL) 5-10 MG capsule TAKE ONE CAPSULE EVERYDAY FOR BLOOD PRESSURE 30 capsule 12  . aspirin 81 MG tablet Take 81 mg by mouth daily.    . Cholecalciferol (VITAMIN D) 1000 UNITS capsule Take 1,000 Units by mouth daily.    Marland Kitchen glipiZIDE (GLUCOTROL) 5 MG tablet TAKE 1 TABLET EVERY DAY FOR SUGAR CONTROL 30 tablet 12  . hydrochlorothiazide (MICROZIDE) 12.5 MG capsule TAKE ONE CAPSULE DAILY FOR BLOOD PRESSURE/FLUID 30 capsule 12  . JANUVIA 100 MG tablet TAKE 1 TABLET EVERY DAY FOR SUGAR CONTROL 30 tablet 12  . levothyroxine (SYNTHROID, LEVOTHROID) 100 MCG tablet TAKE 1 TABLET EVERY DAY FOR THYROID 30 tablet 5  . metoprolol (TOPROL-XL) 200 MG 24 hr tablet TAKE 1 TABLET EVERY DAY FOR BLOOD PRESSURE 30 tablet 11  . Multiple Vitamins-Minerals (CENTRUM SILVER ADULT 50+ PO) Take 1 tablet by  mouth daily.    Marland Kitchen omeprazole (PRILOSEC OTC) 20 MG tablet Take 20 mg by mouth daily.    . meloxicam (MOBIC) 7.5 MG tablet Take 7.5 mg by mouth 2 (two) times daily as needed for pain.     No facility-administered medications prior to visit.    Allergies  Allergen Reactions  . Codeine     headaches    Review of Systems  Constitutional: Negative for fever and malaise/fatigue.  Eyes: Negative.   Respiratory: Negative for cough, shortness of breath and wheezing.   Cardiovascular: Negative for chest pain, palpitations, orthopnea, claudication, leg swelling and PND.  Skin:  Positive for rash. Negative for itching.  Neurological: Negative for dizziness, weakness and headaches.  Psychiatric/Behavioral: Negative.    Objective:  BP 136/68 mmHg  Pulse 76  Temp(Src) 97.8 F (36.6 C) (Oral)  Resp 16  Wt 178 lb (80.74 kg)  Physical Exam  Constitutional: She is oriented to person, place, and time and well-developed, well-nourished, and in no distress.  HENT:  Head: Normocephalic and atraumatic.  Right Ear: External ear normal.  Left Ear: External ear normal.  Nose: Nose normal.  Eyes: Conjunctivae are normal. Pupils are equal, round, and reactive to light.  Neck: Normal range of motion.  Cardiovascular: Normal rate and regular rhythm.   Pulmonary/Chest: Effort normal and breath sounds normal.  Abdominal: Soft.  Neurological: She is alert and oriented to person, place, and time.  Skin: Skin is warm and dry.  4" x 2" area mild mild dermatitis of the left buttocks.  Psychiatric: Mood, memory, affect and judgment normal.    Assessment and Plan :  Dermatitis Resolving. Discussed using vitamin E and aloe topically. Patient reassured. This is not shingles. Type 2 diabetes   [facility] 08/01/2015 11:00 AM

## 2015-08-13 ENCOUNTER — Other Ambulatory Visit: Payer: Self-pay | Admitting: Family Medicine

## 2015-10-02 ENCOUNTER — Ambulatory Visit (INDEPENDENT_AMBULATORY_CARE_PROVIDER_SITE_OTHER): Payer: Medicare Other | Admitting: Family Medicine

## 2015-10-02 ENCOUNTER — Encounter: Payer: Self-pay | Admitting: Family Medicine

## 2015-10-02 VITALS — BP 110/58 | HR 78 | Temp 97.9°F | Resp 16 | Wt 177.0 lb

## 2015-10-02 DIAGNOSIS — E119 Type 2 diabetes mellitus without complications: Secondary | ICD-10-CM | POA: Diagnosis not present

## 2015-10-02 DIAGNOSIS — E039 Hypothyroidism, unspecified: Secondary | ICD-10-CM

## 2015-10-02 DIAGNOSIS — I10 Essential (primary) hypertension: Secondary | ICD-10-CM | POA: Diagnosis not present

## 2015-10-02 DIAGNOSIS — E1169 Type 2 diabetes mellitus with other specified complication: Secondary | ICD-10-CM

## 2015-10-02 DIAGNOSIS — E669 Obesity, unspecified: Secondary | ICD-10-CM

## 2015-10-02 DIAGNOSIS — E785 Hyperlipidemia, unspecified: Secondary | ICD-10-CM | POA: Diagnosis not present

## 2015-10-02 LAB — POCT GLYCOSYLATED HEMOGLOBIN (HGB A1C): Hemoglobin A1C: 7.8

## 2015-10-02 NOTE — Progress Notes (Signed)
Subjective:  HPI  Diabetes Mellitus Type II, Follow-up:   Lab Results  Component Value Date   HGBA1C 8.2 05/29/2015   HGBA1C 7.9 (H) 01/29/2015   HGBA1C 7.6 09/27/2014    Last seen for diabetes 3 months ago.  Management since then includes none. She reports good compliance with treatment. She is not having side effects.  Home blood sugar records: not being checked  Episodes of hypoglycemia? no   Current Insulin Regimen: n/a Most Recent Eye Exam: almost a year ago Current exercise: none  Pertinent Labs:    Component Value Date/Time   CHOL 269 (H) 11/01/2014 0857   TRIG 306 (H) 11/01/2014 0857   HDL 45 11/01/2014 0857   LDLCALC 163 (H) 11/01/2014 0857   CREATININE 1.7 (A) 05/16/2014   CREATININE 1.94 (H) 07/01/2012 1250    Wt Readings from Last 3 Encounters:  10/02/15 177 lb (80.3 kg)  08/01/15 178 lb (80.7 kg)  07/25/15 178 lb (80.7 kg)    ------------------------------------------------------------------------   Hypertension, follow-up:  BP Readings from Last 3 Encounters:  10/02/15 (!) 110/58  08/01/15 136/68  07/25/15 128/70    She was last seen for hypertension 3 months ago.  BP at that visit was 136/68. Management since that visit includes none. She reports good compliance with treatment. She is not having side effects.  She is not exercising. She is adherent to low salt diet.   Outside blood pressures are not being checked. She is experiencing none.  Patient denies chest pain, chest pressure/discomfort, claudication, dyspnea, exertional chest pressure/discomfort, fatigue, irregular heart beat, lower extremity edema, near-syncope, orthopnea, palpitations and paroxysmal nocturnal dyspnea.   Cardiovascular risk factors include advanced age (older than 26 for men, 65 for women), diabetes mellitus, hypertension and smoking/ tobacco exposure.   Wt Readings from Last 3 Encounters:  10/02/15 177 lb (80.3 kg)  08/01/15 178 lb (80.7 kg)  07/25/15  178 lb (80.7 kg)   ------------------------------------------------------------------------    Prior to Admission medications   Medication Sig Start Date End Date Taking? Authorizing Provider  amLODipine-benazepril (LOTREL) 5-10 MG capsule TAKE ONE CAPSULE EVERYDAY FOR BLOOD PRESSURE 07/10/15   Jerrol Banana., MD  aspirin 81 MG tablet Take 81 mg by mouth daily.    Historical Provider, MD  Cholecalciferol (VITAMIN D) 1000 UNITS capsule Take 1,000 Units by mouth daily.    Historical Provider, MD  glipiZIDE (GLUCOTROL) 5 MG tablet TAKE 1 TABLET EVERY DAY FOR SUGAR CONTROL 06/04/15   Jerrol Banana., MD  hydrochlorothiazide (MICROZIDE) 12.5 MG capsule TAKE ONE CAPSULE DAILY FOR BLOOD PRESSURE/FLUID 05/20/15   Jerrol Banana., MD  JANUVIA 100 MG tablet TAKE 1 TABLET EVERY DAY FOR SUGAR CONTROL 07/04/15   Jerrol Banana., MD  levothyroxine (SYNTHROID, LEVOTHROID) 100 MCG tablet TAKE 1 TABLET EVERY DAY FOR THYROID 08/13/15   Jerrol Banana., MD  metoprolol (TOPROL-XL) 200 MG 24 hr tablet TAKE 1 TABLET EVERY DAY FOR BLOOD PRESSURE 07/31/15   Jerrol Banana., MD  Multiple Vitamins-Minerals (CENTRUM SILVER ADULT 50+ PO) Take 1 tablet by mouth daily.    Historical Provider, MD  omeprazole (PRILOSEC OTC) 20 MG tablet Take 20 mg by mouth daily.    Historical Provider, MD    Patient Active Problem List   Diagnosis Date Noted  . Acid reflux 09/07/2014  . AI (aortic incompetence) 09/07/2014  . Allergic rhinitis 09/07/2014  . Clinical depression 09/07/2014  . Diabetes mellitus type 2 in obese (  [place]) 09/07/2014  . Essential (primary) hypertension 09/07/2014  . H/O thyroid disease 09/07/2014  . Personal history of infectious and parasitic disease 09/07/2014  . H/O respiratory system disease 09/07/2014  . HLD (hyperlipidemia) 09/07/2014  . Acquired hypothyroidism 09/07/2014  . Adiposity 09/07/2014  . Post menopausal syndrome 09/07/2014  . Basal cell papilloma 09/07/2014  .  Avitaminosis D 09/07/2014  . Rectal bleeding 07/01/2012  . Other and unspecified noninfectious gastroenteritis and colitis(558.9) 06/27/2012  . Abnormal chest sounds 05/06/2010    Past Medical History:  Diagnosis Date  . Acid reflux   . Diabetes mellitus without complication ([place])   . High cholesterol   . Hypertension   . Other and unspecified noninfectious gastroenteritis and colitis(558.9) 06/27/2012  . Rectal bleeding 2011    Social History   Social History  . Marital status: Married    Spouse name: N/A  . Number of children: N/A  . Years of education: N/A   Occupational History  . Not on file.   Social History Main Topics  . Smoking status: Former Research scientist (life sciences)  . Smokeless tobacco: Never Used     Comment: Stopped about 25 years ago  . Alcohol use Yes     Comment: occasionally with dinner  . Drug use: No  . Sexual activity: Not on file   Other Topics Concern  . Not on file   Social History Narrative  . No narrative on file    Allergies  Allergen Reactions  . Codeine     headaches    Review of Systems  Constitutional: Negative.   HENT: Negative.   Eyes: Negative.   Respiratory: Negative.   Cardiovascular: Negative.   Gastrointestinal: Negative.   Genitourinary: Negative.   Musculoskeletal: Negative.   Skin: Negative.   Neurological: Negative.   Endo/Heme/Allergies: Negative.   Psychiatric/Behavioral: Negative.     Immunization History  Administered Date(s) Administered  . Influenza, High Dose Seasonal PF 09/27/2014  . Pneumococcal Conjugate-13 06/05/2013  . Td 12/01/2006   Objective:  BP (!) 110/58 (BP Location: Left Arm, Patient Position: Sitting, Cuff Size: Large)   Pulse 78   Temp 97.9 F (36.6 C) (Oral)   Resp 16   Wt 177 lb (80.3 kg)   BMI 29.45 kg/m   Physical Exam  Constitutional: She is oriented to person, place, and time and well-developed, well-nourished, and in no distress.  Eyes: Conjunctivae and EOM are normal. Pupils are equal,  round, and reactive to light.  Neck: Normal range of motion. Neck supple.  Cardiovascular: Normal rate, regular rhythm, normal heart sounds and intact distal pulses.   Pulmonary/Chest: Effort normal and breath sounds normal.  Abdominal: Soft. Bowel sounds are normal.  Musculoskeletal: Normal range of motion.  Neurological: She is alert and oriented to person, place, and time. She has normal reflexes. Gait normal. GCS score is 15.  Skin: Skin is warm and dry.  Psychiatric: Mood, memory, affect and judgment normal.    Diabetic Foot Exam - Simple   Simple Foot Form Diabetic Foot exam was performed with the following findings:  Yes 10/02/2015 11:15 AM  Visual Inspection No deformities, no ulcerations, no other skin breakdown bilaterally:  Yes Sensation Testing Intact to touch and monofilament testing bilaterally:  Yes Pulse Check Posterior Tibialis and Dorsalis pulse intact bilaterally:  Yes Comments      Lab Results  Component Value Date   WBC 5.6 01/29/2015   HGB 12.0 05/16/2014   HCT 36.8 01/29/2015   PLT 325 01/29/2015   GLUCOSE  192 (H) 07/01/2012   CHOL 269 (H) 11/01/2014   TRIG 306 (H) 11/01/2014   HDL 45 11/01/2014   LDLCALC 163 (H) 11/01/2014   TSH 0.802 11/01/2014   HGBA1C 8.2 05/29/2015    CMP     Component Value Date/Time   NA 142 05/16/2014   NA 137 07/01/2012 1250   K 4.7 05/16/2014   K 4.1 07/01/2012 1250   CL 106 07/01/2012 1250   CO2 24 07/01/2012 1250   GLUCOSE 192 (H) 07/01/2012 1250   BUN 35 (A) 05/16/2014   BUN 39 (H) 07/01/2012 1250   CREATININE 1.7 (A) 05/16/2014   CREATININE 1.94 (H) 07/01/2012 1250   CALCIUM 9.1 07/01/2012 1250   AST 21 05/16/2014   ALT 20 05/16/2014   ALKPHOS 73 05/16/2014   GFRNONAA 25 (L) 07/01/2012 1250   GFRAA 29 (L) 07/01/2012 1250    Assessment and Plan :  1. Diabetes mellitus type 2 in obese (HCC)  - POCT HgB A1C--7.8 today.  2. Essential (primary) hypertension  - CBC with Differential/Platelet  3. HLD  (hyperlipidemia)  - Lipid Panel With LDL/HDL Ratio - Comprehensive metabolic panel  4. Acquired hypothyroidism  - TSH  I have done the exam and reviewed the above chart and it is accurate to the best of my knowledge.  HPI, Exam, and A&P Transcribed under the direction and in the presence of Stephinie Battisti L. Cranford Mon, MD  Electronically Signed: Webb Laws, [place] MD [facility] 10/02/2015 11:07 AM

## 2015-10-11 DIAGNOSIS — E785 Hyperlipidemia, unspecified: Secondary | ICD-10-CM | POA: Diagnosis not present

## 2015-10-11 DIAGNOSIS — E039 Hypothyroidism, unspecified: Secondary | ICD-10-CM | POA: Diagnosis not present

## 2015-10-11 DIAGNOSIS — I10 Essential (primary) hypertension: Secondary | ICD-10-CM | POA: Diagnosis not present

## 2015-10-12 LAB — LIPID PANEL WITH LDL/HDL RATIO
CHOLESTEROL TOTAL: 319 mg/dL — AB (ref 100–199)
HDL: 40 mg/dL (ref >39)
TRIGLYCERIDES: 484 mg/dL — AB (ref 0–149)

## 2015-10-12 LAB — CBC WITH DIFFERENTIAL/PLATELET
BASOS ABS: 0 10*3/uL (ref 0.0–0.2)
Basos: 1 %
EOS (ABSOLUTE): 0.5 10*3/uL — AB (ref 0.0–0.4)
Eos: 10 %
HEMOGLOBIN: 11.7 g/dL (ref 11.1–15.9)
Hematocrit: 34.1 % (ref 34.0–46.6)
IMMATURE GRANS (ABS): 0 10*3/uL (ref 0.0–0.1)
IMMATURE GRANULOCYTES: 0 %
LYMPHS: 36 %
Lymphocytes Absolute: 1.8 10*3/uL (ref 0.7–3.1)
MCH: 33.1 pg — AB (ref 26.6–33.0)
MCHC: 34.3 g/dL (ref 31.5–35.7)
MCV: 97 fL (ref 79–97)
MONOCYTES: 9 %
Monocytes Absolute: 0.5 10*3/uL (ref 0.1–0.9)
NEUTROS ABS: 2.3 10*3/uL (ref 1.4–7.0)
NEUTROS PCT: 44 %
PLATELETS: 292 10*3/uL (ref 150–379)
RBC: 3.53 x10E6/uL — AB (ref 3.77–5.28)
RDW: 13.4 % (ref 12.3–15.4)
WBC: 5 10*3/uL (ref 3.4–10.8)

## 2015-10-12 LAB — COMPREHENSIVE METABOLIC PANEL
ALBUMIN: 4.2 g/dL (ref 3.5–4.8)
ALT: 28 IU/L (ref 0–32)
AST: 24 IU/L (ref 0–40)
Albumin/Globulin Ratio: 1.6 (ref 1.2–2.2)
Alkaline Phosphatase: 69 IU/L (ref 39–117)
BILIRUBIN TOTAL: 0.3 mg/dL (ref 0.0–1.2)
BUN / CREAT RATIO: 16 (ref 12–28)
BUN: 26 mg/dL (ref 8–27)
CO2: 23 mmol/L (ref 18–29)
CREATININE: 1.6 mg/dL — AB (ref 0.57–1.00)
Calcium: 9.6 mg/dL (ref 8.7–10.3)
Chloride: 101 mmol/L (ref 96–106)
GFR calc non Af Amer: 31 mL/min/{1.73_m2} — ABNORMAL LOW (ref >59)
GFR, EST AFRICAN AMERICAN: 36 mL/min/{1.73_m2} — AB (ref >59)
GLOBULIN, TOTAL: 2.6 g/dL (ref 1.5–4.5)
GLUCOSE: 166 mg/dL — AB (ref 65–99)
Potassium: 4.4 mmol/L (ref 3.5–5.2)
SODIUM: 141 mmol/L (ref 134–144)
TOTAL PROTEIN: 6.8 g/dL (ref 6.0–8.5)

## 2015-10-12 LAB — TSH: TSH: 2.84 u[IU]/mL (ref 0.450–4.500)

## 2015-10-14 ENCOUNTER — Telehealth: Payer: Self-pay

## 2015-10-14 NOTE — Telephone Encounter (Signed)
Advised pt of lab results. Pt verbally acknowledges understanding. Jacelynn Hayton Drozdowski, CMA   

## 2015-10-14 NOTE — Telephone Encounter (Signed)
-----   Message from Jerrol Banana., MD sent at 10/14/2015  8:45 AM EDT ----- Labs stable.

## 2015-11-01 ENCOUNTER — Ambulatory Visit (INDEPENDENT_AMBULATORY_CARE_PROVIDER_SITE_OTHER): Payer: Medicare Other

## 2015-11-01 DIAGNOSIS — Z23 Encounter for immunization: Secondary | ICD-10-CM

## 2015-12-23 ENCOUNTER — Ambulatory Visit (INDEPENDENT_AMBULATORY_CARE_PROVIDER_SITE_OTHER): Payer: Medicare Other | Admitting: Family Medicine

## 2015-12-23 DIAGNOSIS — Z23 Encounter for immunization: Secondary | ICD-10-CM

## 2016-01-16 ENCOUNTER — Telehealth: Payer: Self-pay | Admitting: Family Medicine

## 2016-01-16 NOTE — Telephone Encounter (Signed)
Called Pt to schedule AWV with [facility] - knb °

## 2016-02-19 ENCOUNTER — Ambulatory Visit (INDEPENDENT_AMBULATORY_CARE_PROVIDER_SITE_OTHER): Payer: Medicare Other | Admitting: Family Medicine

## 2016-02-19 VITALS — BP 142/86 | HR 80 | Temp 98.5°F | Resp 14 | Wt 175.0 lb

## 2016-02-19 DIAGNOSIS — E119 Type 2 diabetes mellitus without complications: Secondary | ICD-10-CM

## 2016-02-19 DIAGNOSIS — E039 Hypothyroidism, unspecified: Secondary | ICD-10-CM

## 2016-02-19 DIAGNOSIS — E784 Other hyperlipidemia: Secondary | ICD-10-CM | POA: Diagnosis not present

## 2016-02-19 DIAGNOSIS — I10 Essential (primary) hypertension: Secondary | ICD-10-CM

## 2016-02-19 DIAGNOSIS — E7849 Other hyperlipidemia: Secondary | ICD-10-CM

## 2016-02-19 LAB — POCT UA - MICROALBUMIN: MICROALBUMIN (UR) POC: 50 mg/L

## 2016-02-19 LAB — POCT GLYCOSYLATED HEMOGLOBIN (HGB A1C): Hemoglobin A1C: 7.6

## 2016-02-19 NOTE — Progress Notes (Signed)
Tracy Silva  MRN: [id number] DOB: Oct 19, 1940  Subjective:  HPI  Patient is here for 4 months follow up. She is checking her sugar and fasting have been around 140s-150s. She is due for eye exam and will get that scheduled. Lab Results  Component Value Date   HGBA1C 7.8 10/02/2015   Patient is checking her b/p at home and readings are usually very low patient states maybe around 100/88 or something like that. BP Readings from Last 3 Encounters:  02/19/16 (!) 142/86  10/02/15 (!) 110/58  08/01/15 136/68   She is taking Prilosec daily and has not tried to wean off the medication. Last labs were done on 10/11/15 routine labs, stable. Patient Active Problem List   Diagnosis Date Noted  . Acid reflux 09/07/2014  . AI (aortic incompetence) 09/07/2014  . Allergic rhinitis 09/07/2014  . Clinical depression 09/07/2014  . Diabetes mellitus type 2 in obese ([place]) 09/07/2014  . Essential (primary) hypertension 09/07/2014  . H/O thyroid disease 09/07/2014  . Personal history of infectious and parasitic disease 09/07/2014  . H/O respiratory system disease 09/07/2014  . HLD (hyperlipidemia) 09/07/2014  . Acquired hypothyroidism 09/07/2014  . Adiposity 09/07/2014  . Post menopausal syndrome 09/07/2014  . Basal cell papilloma 09/07/2014  . Avitaminosis D 09/07/2014  . Rectal bleeding 07/01/2012  . Other and unspecified noninfectious gastroenteritis and colitis(558.9) 06/27/2012  . Abnormal chest sounds 05/06/2010    Past Medical History:  Diagnosis Date  . Acid reflux   . Diabetes mellitus without complication ([place])   . High cholesterol   . Hypertension   . Other and unspecified noninfectious gastroenteritis and colitis(558.9) 06/27/2012  . Rectal bleeding 2011    Social History   Social History  . Marital status: Married    Spouse name: N/A  . Number of children: N/A  . Years of education: N/A   Occupational History  . Not on file.   Social History Main Topics  .  Smoking status: Former Research scientist (life sciences)  . Smokeless tobacco: Never Used     Comment: Stopped about 25 years ago  . Alcohol use Yes     Comment: occasionally with dinner  . Drug use: No  . Sexual activity: Not on file   Other Topics Concern  . Not on file   Social History Narrative  . No narrative on file    Outpatient Encounter Prescriptions as of 02/19/2016  Medication Sig  . amLODipine-benazepril (LOTREL) 5-10 MG capsule TAKE ONE CAPSULE EVERYDAY FOR BLOOD PRESSURE  . glipiZIDE (GLUCOTROL) 5 MG tablet TAKE 1 TABLET EVERY DAY FOR SUGAR CONTROL  . hydrochlorothiazide (MICROZIDE) 12.5 MG capsule TAKE ONE CAPSULE DAILY FOR BLOOD PRESSURE/FLUID  . JANUVIA 100 MG tablet TAKE 1 TABLET EVERY DAY FOR SUGAR CONTROL  . levothyroxine (SYNTHROID, LEVOTHROID) 100 MCG tablet TAKE 1 TABLET EVERY DAY FOR THYROID  . metoprolol (TOPROL-XL) 200 MG 24 hr tablet TAKE 1 TABLET EVERY DAY FOR BLOOD PRESSURE  . Multiple Vitamins-Minerals (CENTRUM SILVER ADULT 50+ PO) Take 1 tablet by mouth daily.  Marland Kitchen omeprazole (PRILOSEC OTC) 20 MG tablet Take 20 mg by mouth daily.  . [DISCONTINUED] aspirin 81 MG tablet Take 81 mg by mouth daily.  . [DISCONTINUED] Cholecalciferol (VITAMIN D) 1000 UNITS capsule Take 1,000 Units by mouth daily.   No facility-administered encounter medications on file as of 02/19/2016.     Allergies  Allergen Reactions  . Codeine     headaches    Review of Systems  Constitutional: Negative.  Eyes: Negative.   Respiratory: Negative.   Cardiovascular: Negative.   Gastrointestinal: Negative.   Musculoskeletal: Negative.   Skin: Negative.   Neurological: Negative.   Psychiatric/Behavioral: Negative.     Objective:  BP (!) 142/86   Pulse 80   Temp 98.5 F (36.9 C)   Resp 14   Wt 175 lb (79.4 kg)   BMI 29.12 kg/m   Physical Exam  Constitutional: She is oriented to person, place, and time and well-developed, well-nourished, and in no distress.  HENT:  Head: Normocephalic and  atraumatic.  Right Ear: External ear normal.  Left Ear: External ear normal.  Nose: Nose normal.  Eyes: Conjunctivae are normal. Pupils are equal, round, and reactive to light.  Neck: Normal range of motion. Neck supple.  Cardiovascular: Normal rate, regular rhythm, normal heart sounds and intact distal pulses.   No murmur heard. Pulmonary/Chest: Effort normal and breath sounds normal. No respiratory distress. She has no wheezes.  Neurological: She is alert and oriented to person, place, and time. Gait normal. GCS score is 15.  Skin: Skin is warm.  Psychiatric: Mood, memory, affect and judgment normal.    Assessment and Plan :  1. Controlled type 2 diabetes mellitus without complication, without long-term current use of insulin (HCC) A1C 7.6. Better. Continue current medication. - POCT HgB A1C rtc 3 MONTHS. 2. Essential (primary) hypertension Stable. Follow. 3. Other hyperlipidemia 4. Acquired hypothyroidism Last labs in September 2017 were stable. HPI, Exam and A&P transcribed under direction and in the presence of Miguel Aschoff, MD. I have done the exam and reviewed the chart and it is accurate to the best of my knowledge. Development worker, community has been used and  any errors in dictation or transcription are unintentional. Miguel Aschoff M.D. [facility]

## 2016-05-19 ENCOUNTER — Ambulatory Visit (INDEPENDENT_AMBULATORY_CARE_PROVIDER_SITE_OTHER): Payer: Medicare Other | Admitting: Family Medicine

## 2016-05-19 ENCOUNTER — Encounter: Payer: Self-pay | Admitting: General Surgery

## 2016-05-19 ENCOUNTER — Encounter: Payer: Self-pay | Admitting: Family Medicine

## 2016-05-19 VITALS — BP 112/60 | HR 84 | Temp 97.7°F | Resp 16 | Wt 174.0 lb

## 2016-05-19 DIAGNOSIS — K625 Hemorrhage of anus and rectum: Secondary | ICD-10-CM | POA: Diagnosis not present

## 2016-05-19 NOTE — Progress Notes (Signed)
Patient: Tracy Silva Female    DOB: 1940/09/25   76 y.o.   MRN: [id number] Visit Date: 05/19/2016  Today's Provider: Wilhemena Durie, MD   Chief Complaint  Patient presents with  . Rectal Bleeding   Subjective:    Rectal Bleeding   Episode onset: x 2 weeks. The problem has been gradually improving. The pain is mild (at rectum). The stool is described as soft. Associated symptoms include rectal pain and coughing (URI sx; is taking Coricidin and Claritin with relief ). Pertinent negatives include no anorexia, no fever, no abdominal pain, no diarrhea, no nausea, no vomiting, no hematuria, no vaginal bleeding, no vaginal discharge and no headaches. Her past medical history does not include abdominal surgery, inflammatory bowel disease, recent abdominal injury, recent antibiotic use, recent change in diet or a recent illness.  Last colonoscopy- 03/26/2009- 1 polyp. Tubular adenoma.      Allergies  Allergen Reactions  . Codeine     headaches     Current Outpatient Prescriptions:  .  acetaminophen (TYLENOL) 500 MG tablet, Take 500 mg by mouth every 6 (six) hours as needed., Disp: , Rfl:  .  amLODipine-benazepril (LOTREL) 5-10 MG capsule, TAKE ONE CAPSULE EVERYDAY FOR BLOOD PRESSURE, Disp: 30 capsule, Rfl: 12 .  glipiZIDE (GLUCOTROL) 5 MG tablet, TAKE 1 TABLET EVERY DAY FOR SUGAR CONTROL, Disp: 30 tablet, Rfl: 12 .  hydrochlorothiazide (MICROZIDE) 12.5 MG capsule, TAKE ONE CAPSULE DAILY FOR BLOOD PRESSURE/FLUID, Disp: 30 capsule, Rfl: 12 .  JANUVIA 100 MG tablet, TAKE 1 TABLET EVERY DAY FOR SUGAR CONTROL, Disp: 30 tablet, Rfl: 12 .  levothyroxine (SYNTHROID, LEVOTHROID) 100 MCG tablet, TAKE 1 TABLET EVERY DAY FOR THYROID, Disp: 30 tablet, Rfl: 12 .  loratadine (CLARITIN) 10 MG tablet, Take 10 mg by mouth daily., Disp: , Rfl:  .  metoprolol (TOPROL-XL) 200 MG 24 hr tablet, TAKE 1 TABLET EVERY DAY FOR BLOOD PRESSURE, Disp: 30 tablet, Rfl: 11 .  Multiple Vitamins-Minerals  (CENTRUM SILVER ADULT 50+ PO), Take 1 tablet by mouth daily., Disp: , Rfl:  .  naproxen sodium (ANAPROX) 220 MG tablet, Take 220 mg by mouth 2 (two) times daily with a meal., Disp: , Rfl:  .  omeprazole (PRILOSEC OTC) 20 MG tablet, Take 20 mg by mouth daily., Disp: , Rfl:   Review of Systems  Constitutional: Negative for fever.  Respiratory: Positive for cough (URI sx; is taking Coricidin and Claritin with relief ).   Gastrointestinal: Positive for hematochezia and rectal pain. Negative for abdominal pain, anorexia, diarrhea, nausea and vomiting.  Genitourinary: Negative for hematuria, vaginal bleeding and vaginal discharge.  Neurological: Negative for headaches.    Social History  Substance Use Topics  . Smoking status: Former Smoker    Packs/day: 0.50    Years: 4.00    Quit date: 01/26/1971  . Smokeless tobacco: Never Used  . Alcohol use Yes     Comment: occasionally with dinner   Objective:   BP 112/60 (BP Location: Right Arm, Patient Position: Sitting, Cuff Size: Large)   Pulse 84   Temp 97.7 F (36.5 C) (Oral)   Resp 16   Wt 174 lb (78.9 kg)   BMI 28.96 kg/m  Vitals:   05/19/16 0812  BP: 112/60  Pulse: 84  Resp: 16  Temp: 97.7 F (36.5 C)  TempSrc: Oral  Weight: 174 lb (78.9 kg)     Physical Exam  Constitutional: She appears well-developed and well-nourished.  HENT:  Head:  Normocephalic.  Eyes: Conjunctivae are normal.  Neck: Normal range of motion.  Abdominal: Soft. There is no tenderness.  Genitourinary:  Genitourinary Comments: Internal hemorrhoid vs perianal polyp on left anal verge.  Psychiatric: She has a normal mood and affect. Her behavior is normal.        Assessment & Plan:     1. Rectal bleeding Question internal hemorrhoid vs perianal polyp. Will refer to surgery for further evaluation. - Ambulatory referral to General Surgery     Patient seen and examined by Miguel Aschoff, MD, and note scribed by Renaldo Fiddler, CMA. I have done  the exam and reviewed the above chart and it is accurate to the best of my knowledge. Development worker, community has been used in this note in any air is in the dictation or transcription are unintentional.  Wilhemena Durie, MD  [place]

## 2016-05-26 ENCOUNTER — Encounter: Payer: Self-pay | Admitting: *Deleted

## 2016-05-29 ENCOUNTER — Other Ambulatory Visit: Payer: Self-pay | Admitting: Family Medicine

## 2016-06-01 ENCOUNTER — Ambulatory Visit: Payer: Self-pay | Admitting: General Surgery

## 2016-06-03 ENCOUNTER — Other Ambulatory Visit: Payer: Self-pay | Admitting: Family Medicine

## 2016-06-29 ENCOUNTER — Encounter: Payer: Self-pay | Admitting: General Surgery

## 2016-06-29 ENCOUNTER — Telehealth: Payer: Self-pay

## 2016-06-29 DIAGNOSIS — E119 Type 2 diabetes mellitus without complications: Secondary | ICD-10-CM

## 2016-06-29 MED ORDER — PIOGLITAZONE HCL 45 MG PO TABS: 45.0000 mg | ORAL_TABLET | Freq: Every day | ORAL | 3 refills | Status: DC

## 2016-06-29 NOTE — Telephone Encounter (Signed)
Patient is afraid to get in donut hole with Januvia it is expensive and wants to know if she can try generic Actos instead. Actos is what her husband is taking and he was taking Januvia before and it was expensive for him. Please review for Dr Rosanna Randy thank you-aa

## 2016-06-29 NOTE — Telephone Encounter (Signed)
Actos sent in to [place]. Need to have A1c checked in 1-3 months to make sure dose is controlling well enough. Watch for low blood sugars as well. May need to decrease dose.

## 2016-06-30 NOTE — Telephone Encounter (Signed)
Pt advised-aa 

## 2016-06-30 NOTE — Telephone Encounter (Signed)
Called no answer-aa unable to leave a message

## 2016-07-03 ENCOUNTER — Other Ambulatory Visit: Payer: Self-pay | Admitting: Family Medicine

## 2016-07-14 ENCOUNTER — Ambulatory Visit (INDEPENDENT_AMBULATORY_CARE_PROVIDER_SITE_OTHER): Payer: Medicare Other | Admitting: General Surgery

## 2016-07-14 ENCOUNTER — Encounter: Payer: Self-pay | Admitting: General Surgery

## 2016-07-14 VITALS — BP 124/70 | HR 70 | Resp 14 | Ht 63.0 in | Wt 172.0 lb

## 2016-07-14 DIAGNOSIS — K625 Hemorrhage of anus and rectum: Secondary | ICD-10-CM

## 2016-07-14 DIAGNOSIS — C20 Malignant neoplasm of rectum: Secondary | ICD-10-CM | POA: Diagnosis not present

## 2016-07-14 DIAGNOSIS — Z86010 Personal history of colonic polyps: Secondary | ICD-10-CM

## 2016-07-14 DIAGNOSIS — K629 Disease of anus and rectum, unspecified: Secondary | ICD-10-CM | POA: Diagnosis not present

## 2016-07-14 DIAGNOSIS — K6289 Other specified diseases of anus and rectum: Secondary | ICD-10-CM | POA: Insufficient documentation

## 2016-07-14 LAB — POC HEMOCCULT BLD/STL (OFFICE/1-CARD/DIAGNOSTIC): Fecal Occult Blood, POC: POSITIVE — AB

## 2016-07-14 MED ORDER — POLYETHYLENE GLYCOL 3350 17 GM/SCOOP PO POWD: 1.0000 | Freq: Once | ORAL | 0 refills | Status: AC

## 2016-07-14 NOTE — Progress Notes (Signed)
colon Patient ID: Tracy Silva, female   DOB: Sep 15, 1940, 76 y.o.   MRN: [id number]  Chief Complaint  Patient presents with  . Rectal Bleeding  . Colonoscopy    HPI Tracy Silva is a 76 y.o. female here today for a evaluation of rectal bleeding and colonoscopy. Last Last colonoscopy was 03/26/2009- 1 polyp. Tubular adenoma. Patient states she has noticed some bleeding over the last two months. Bright red on the paper and in the bowl. She did has some pain while moving her bowels.She state the bleeding as improved.  Moves her bowels every other day.   HPI  Past Medical History:  Diagnosis Date  . Acid reflux   . Diabetes mellitus without complication ([place])   . High cholesterol   . Hypertension   . Other and unspecified noninfectious gastroenteritis and colitis(558.9) 06/27/2012  . Rectal bleeding 2011    Past Surgical History:  Procedure Laterality Date  . COLONOSCOPY  2011  . TONSILLECTOMY    . TOOTH EXTRACTION    . WISDOM TOOTH EXTRACTION      Family History  Problem Relation Age of Onset  . Pancreatic cancer Mother   . Breast cancer Paternal Aunt     Social History Social History  Substance Use Topics  . Smoking status: Former Smoker    Packs/day: 0.50    Years: 4.00    Quit date: 01/26/1971  . Smokeless tobacco: Never Used  . Alcohol use Yes     Comment: occasionally with dinner    Allergies  Allergen Reactions  . Codeine     headaches    Current Outpatient Prescriptions  Medication Sig Dispense Refill  . acetaminophen (TYLENOL) 500 MG tablet Take 500 mg by mouth every 6 (six) hours as needed.    Marland Kitchen amLODipine-benazepril (LOTREL) 5-10 MG capsule TAKE 1 CAPSULE BY MOUTH DAILY FOR BLOOD PRESSURE 30 capsule 12  . glipiZIDE (GLUCOTROL) 5 MG tablet TAKE 1 TABLET EVERY DAY FOR SUGAR CONTROL 90 tablet 3  . hydrochlorothiazide (MICROZIDE) 12.5 MG capsule TAKE ONE CAPSULE EVERYDAY FOR BLOOD PRESSURE/ FLUID 90 capsule 3  . levothyroxine (SYNTHROID, LEVOTHROID)  100 MCG tablet TAKE 1 TABLET EVERY DAY FOR THYROID 30 tablet 12  . loratadine (CLARITIN) 10 MG tablet Take 10 mg by mouth daily.    . metoprolol (TOPROL-XL) 200 MG 24 hr tablet TAKE 1 TABLET BY MOUTH DAILY FOR BLOOD PRESSURE 30 tablet 12  . Multiple Vitamins-Minerals (CENTRUM SILVER ADULT 50+ PO) Take 1 tablet by mouth daily.    . naproxen sodium (ANAPROX) 220 MG tablet Take 220 mg by mouth 2 (two) times daily with a meal.    . omeprazole (PRILOSEC OTC) 20 MG tablet Take 20 mg by mouth daily.    . pioglitazone (ACTOS) 45 MG tablet Take 1 tablet (45 mg total) by mouth daily. 30 tablet 3  . polyethylene glycol powder (GLYCOLAX/MIRALAX) powder Take 255 g by mouth once. Mix whole container with 64 ounces of clear liquids 255 g 0   No current facility-administered medications for this visit.     Review of Systems Review of Systems  Constitutional: Negative.   Respiratory: Negative.   Cardiovascular: Negative.   Gastrointestinal: Positive for anal bleeding.    Blood pressure 124/70, pulse 70, resp. rate 14, height 5\' 3"  (1.6 m), weight 172 lb (78 kg).  Physical Exam Physical Exam  Constitutional: She is oriented to person, place, and time. She appears well-developed and well-nourished.  Eyes: Conjunctivae are normal. No scleral  icterus.  Neck: Neck supple.  Cardiovascular: Normal rate, regular rhythm and normal heart sounds.   Pulmonary/Chest: Effort normal and breath sounds normal.  Abdominal: Soft. Normal appearance and bowel sounds are normal. There is no tenderness.  Genitourinary: Rectal exam shows mass and guaiac positive stool. Rectal exam shows no tenderness and anal tone normal.     Lymphadenopathy:    She has no cervical adenopathy.       Right: No inguinal adenopathy present.       Left: No inguinal adenopathy present.  Neurological: She is alert and oriented to person, place, and time.  Skin: Skin is warm and dry.    Data Reviewed Colonoscopy dated 03/26/2009 showed a  5 mm tubular adenoma in the descending colon. Biopsies of a area of irregular tissue in the sigmoid colon showed a tubular adenoma. Neither lesion showed dysplasia or malignancy.  Anoscopy showed no visual abnormality of the lower rectal mucosa. The inflammatory mass appeared to be at the level of the dentate line. Biopsy obtained without discomfort.  Assessment    Perianal mass, questionable granulation tissue from a chronic fissure versus malignancy.  Past history colonic polyps.    Plan        Colonoscopy with possible biopsy/polypectomy prn: Information regarding the procedure, including its potential risks and complications (including but not limited to perforation of the bowel, which may require emergency surgery to repair, and bleeding) was verbally given to the patient. Educational information regarding lower intestinal endoscopy was given to the patient. Written instructions for how to complete the bowel prep using Miralax were provided. The importance of drinking ample fluids to avoid dehydration as a result of the prep emphasized.  HPI, Physical Exam, Assessment and Plan have been scribed under the direction and in the presence of Hervey Ard, MD.  Gaspar Cola, CMA  I have completed the exam and reviewed the above documentation for accuracy and completeness.  I agree with the above.  Haematologist has been used and any errors in dictation or transcription are unintentional.  Hervey Ard, M.D., F.A.C.S.  The patient is scheduled for a Colonoscopy and exam under anesthesia at [facility] on 07/27/16. They are aware to call the day before to get their arrival time. Miralax prescription has been sent into the patient's pharmacy. She will pre admit by phone. The patient is aware of date and instructions.  Documented by Lesly Rubenstein LPN    Robert Bellow 07/14/2016, 7:44 PM

## 2016-07-14 NOTE — Patient Instructions (Addendum)
Colonoscopy, Adult A colonoscopy is an exam to look at the entire large intestine. During the exam, a lubricated, bendable tube is inserted into the anus and then passed into the rectum, colon, and other parts of the large intestine. A colonoscopy is often done as a part of normal colorectal screening or in response to certain symptoms, such as anemia, persistent diarrhea, abdominal pain, and blood in the stool. The exam can help screen for and diagnose medical problems, including:  Tumors.  Polyps.  Inflammation.  Areas of bleeding.  Tell a health care provider about:  Any allergies you have.  All medicines you are taking, including vitamins, herbs, eye drops, creams, and over-the-counter medicines.  Any problems you or family members have had with anesthetic medicines.  Any blood disorders you have.  Any surgeries you have had.  Any medical conditions you have.  Any problems you have had passing stool. What are the risks? Generally, this is a safe procedure. However, problems may occur, including:  Bleeding.  A tear in the intestine.  A reaction to medicines given during the exam.  Infection (rare).  What happens before the procedure? Eating and drinking restrictions Follow instructions from your health care provider about eating and drinking, which may include:  A few days before the procedure - follow a low-fiber diet. Avoid nuts, seeds, dried fruit, raw fruits, and vegetables.  1-3 days before the procedure - follow a clear liquid diet. Drink only clear liquids, such as clear broth or bouillon, black coffee or tea, clear juice, clear soft drinks or sports drinks, gelatin dessert, and popsicles. Avoid any liquids that contain red or purple dye.  On the day of the procedure - do not eat or drink anything during the 2 hours before the procedure, or within the time period that your health care provider recommends.  Bowel prep If you were prescribed an oral bowel  prep to clean out your colon:  Take it as told by your health care provider. Starting the day before your procedure, you will need to drink a large amount of medicated liquid. The liquid will cause you to have multiple loose stools until your stool is almost clear or light green.  If your skin or anus gets irritated from diarrhea, you may use these to relieve the irritation: ? Medicated wipes, such as adult wet wipes with aloe and vitamin E. ? A skin soothing-product like petroleum jelly.  If you vomit while drinking the bowel prep, take a break for up to 60 minutes and then begin the bowel prep again. If vomiting continues and you cannot take the bowel prep without vomiting, call your health care provider.  General instructions  Ask your health care provider about changing or stopping your regular medicines. This is especially important if you are taking diabetes medicines or blood thinners.  Plan to have someone take you home from the hospital or clinic. What happens during the procedure?  An IV tube may be inserted into one of your veins.  You will be given medicine to help you relax (sedative).  To reduce your risk of infection: ? Your health care team will wash or sanitize their hands. ? Your anal area will be washed with soap.  You will be asked to lie on your side with your knees bent.  Your health care provider will lubricate a long, thin, flexible tube. The tube will have a camera and a light on the end.  The tube will be inserted into your  anus.  The tube will be gently eased through your rectum and colon.  Air will be delivered into your colon to keep it open. You may feel some pressure or cramping.  The camera will be used to take images during the procedure.  A small tissue sample may be removed from your body to be examined under a microscope (biopsy). If any potential problems are found, the tissue will be sent to a lab for testing.  If small polyps are found,  your health care provider may remove them and have them checked for cancer cells.  The tube that was inserted into your anus will be slowly removed. The procedure may vary among health care providers and hospitals. What happens after the procedure?  Your blood pressure, heart rate, breathing rate, and blood oxygen level will be monitored until the medicines you were given have worn off.  Do not drive for 24 hours after the exam.  You may have a small amount of blood in your stool.  You may pass gas and have mild abdominal cramping or bloating due to the air that was used to inflate your colon during the exam.  It is up to you to get the results of your procedure. Ask your health care provider, or the department performing the procedure, when your results will be ready. This information is not intended to replace advice given to you by your health care provider. Make sure you discuss any questions you have with your health care provider. Document Released: 01/10/2000 Document Revised: 11/13/2015 Document Reviewed: 03/26/2015 Elsevier Interactive Patient Education  [employer].  The patient is scheduled for a Colonoscopy and exam under anesthesia at [facility] on 07/27/16. They are aware to call the day before to get their arrival time. Miralax prescription has been sent into the patient's pharmacy. She will pre admit by phone. The patient is aware of date and instructions.

## 2016-07-15 ENCOUNTER — Other Ambulatory Visit: Payer: Self-pay

## 2016-07-15 ENCOUNTER — Telehealth: Payer: Self-pay | Admitting: *Deleted

## 2016-07-15 MED ORDER — POLYETHYLENE GLYCOL 3350 17 GM/SCOOP PO POWD: 1.0000 | Freq: Once | ORAL | 0 refills | Status: AC

## 2016-07-15 NOTE — Telephone Encounter (Signed)
Pt requested pharmacy change, completed as requested.

## 2016-07-16 ENCOUNTER — Other Ambulatory Visit: Payer: Self-pay | Admitting: *Deleted

## 2016-07-16 DIAGNOSIS — C20 Malignant neoplasm of rectum: Secondary | ICD-10-CM

## 2016-07-16 HISTORY — DX: Malignant neoplasm of rectum: C20

## 2016-07-20 ENCOUNTER — Encounter
Admission: RE | Admit: 2016-07-20 | Discharge: 2016-07-20 | Disposition: A | Discharge status: Home / Self Care | Payer: Medicare Other | Source: Ambulatory Visit | Attending: General Surgery | Admitting: General Surgery

## 2016-07-20 ENCOUNTER — Telehealth: Payer: Self-pay | Admitting: *Deleted

## 2016-07-20 DIAGNOSIS — C20 Malignant neoplasm of rectum: Secondary | ICD-10-CM | POA: Diagnosis not present

## 2016-07-20 HISTORY — DX: Hypothyroidism, unspecified: E03.9

## 2016-07-20 HISTORY — DX: Anemia, unspecified: D64.9

## 2016-07-20 HISTORY — DX: Cardiac murmur, unspecified: R01.1

## 2016-07-20 HISTORY — DX: Unspecified osteoarthritis, unspecified site: M19.90

## 2016-07-20 HISTORY — DX: Unspecified asthma, uncomplicated: J45.909

## 2016-07-20 NOTE — Telephone Encounter (Signed)
-----   Message from Robert Bellow, MD sent at 07/16/2016  2:50 PM EDT ----- Please schedule the patient for a CT of the abdomen and pelvis with IV and oral contrast: Diagnosed rectal cancer.  Please arrange for her to have a CBC, comprehensive metabolic panel and CEA.  I told her that he would contact her on Monday with the details as she is out celebrating her birthday today.

## 2016-07-20 NOTE — Telephone Encounter (Signed)
Patient contacted today and notified that she has been scheduled for a CT scan at [facility] for 07-22-16 at 8 am (arrive 7:45 am). Prep: NPO 4 hours prior and pick up prep kit.   This patient states she will stop by today and pick up prep kit. She will have labs drawn at [facility] lab today as well. Order has already been taken to the lab.   Patient verbalizes understanding.

## 2016-07-20 NOTE — Patient Instructions (Signed)
  Your procedure is scheduled on: 07-27-16 MONDAY Report to Same Day Surgery 2nd floor medical mall [facility] Entrance-take elevator on left to 2nd floor.  Check in with surgery information desk.) To find out your arrival time please call [phone] between 1PM - 3PM on 07-24-16 FRIDAY  Remember: Instructions that are not followed completely may result in serious medical risk, up to and including death, or upon the discretion of your surgeon and anesthesiologist your surgery may need to be rescheduled.    _x___ 1. Do not eat food or drink liquids after midnight. No gum chewing or hard candies.     __x__ 2. No Alcohol for 24 hours before or after surgery.   __x__3. No Smoking for 24 prior to surgery.   ____  4. Bring all medications with you on the day of surgery if instructed.    __x__ 5. Notify your doctor if there is any change in your medical condition     (cold, fever, infections).     Do not wear jewelry, make-up, hairpins, clips or nail polish.  Do not wear lotions, powders, or perfumes. You may wear deodorant.  Do not shave 48 hours prior to surgery. Men may shave face and neck.  Do not bring valuables to the hospital.    [facility] is not responsible for any belongings or valuables.               Contacts, dentures or bridgework may not be worn into surgery.  Leave your suitcase in the car. After surgery it may be brought to your room.  For patients admitted to the hospital, discharge time is determined by your treatment team.   Patients discharged the day of surgery will not be allowed to drive home.  You will need someone to drive you home and stay with you the night of your procedure.    Please read over the following fact sheets that you were given:      _x___ [place] WITH A SMALL SIP OF WATER. These include:  1. AMLODIPINE-BENAZEPRIL (LOTREL)  2. LEVOTHYROXINE (SYNTHROID)  3. METOPROLOL (TOPROL)  4. PRILOSEC  (OMEPRAZOLE)  5. TAKE AN EXTRA PRILOSEC Sunday NIGHT BEFORE BED (07-26-16)  6.  ____Fleets enema or Magnesium Citrate as directed.   ____ Use CHG Soap or sage wipes as directed on instruction sheet   ____ Use inhalers on the day of surgery and bring to hospital day of surgery  ____ Stop Metformin and Janumet 2 days prior to surgery.    ____ Take 1/2 of usual insulin dose the night before surgery and none on the morning  surgery.   ____ Follow recommendations from Cardiologist, Pulmonologist or PCP regarding stopping Aspirin, Coumadin, Pllavix ,Eliquis, Effient, or Pradaxa, and Pletal.  ____Stop Anti-inflammatories such as Advil, Aleve, Ibuprofen, Motrin, Naproxen, Naprosyn, Goodies powders or aspirin products now-OK to take Tylenol    ____ Stop supplements until after surgery.    ____ Bring C-Pap to the hospital.

## 2016-07-21 ENCOUNTER — Encounter
Admission: RE | Admit: 2016-07-21 | Discharge: 2016-07-21 | Disposition: A | Discharge status: Home / Self Care | Payer: Medicare Other | Source: Ambulatory Visit | Attending: General Surgery | Admitting: General Surgery

## 2016-07-21 DIAGNOSIS — K8020 Calculus of gallbladder without cholecystitis without obstruction: Secondary | ICD-10-CM | POA: Diagnosis not present

## 2016-07-21 DIAGNOSIS — I10 Essential (primary) hypertension: Secondary | ICD-10-CM | POA: Diagnosis not present

## 2016-07-21 DIAGNOSIS — C20 Malignant neoplasm of rectum: Secondary | ICD-10-CM | POA: Diagnosis not present

## 2016-07-21 DIAGNOSIS — R933 Abnormal findings on diagnostic imaging of other parts of digestive tract: Secondary | ICD-10-CM | POA: Diagnosis not present

## 2016-07-21 LAB — CBC WITH DIFFERENTIAL/PLATELET
BASOS ABS: 0 10*3/uL (ref 0.0–0.2)
BASOS: 1 %
EOS (ABSOLUTE): 0.3 10*3/uL (ref 0.0–0.4)
Eos: 6 %
Hematocrit: 34.6 % (ref 34.0–46.6)
Hemoglobin: 11.5 g/dL (ref 11.1–15.9)
Immature Grans (Abs): 0 10*3/uL (ref 0.0–0.1)
Immature Granulocytes: 0 %
LYMPHS ABS: 1.7 10*3/uL (ref 0.7–3.1)
Lymphs: 33 %
MCH: 32.3 pg (ref 26.6–33.0)
MCHC: 33.2 g/dL (ref 31.5–35.7)
MCV: 97 fL (ref 79–97)
Monocytes Absolute: 0.5 10*3/uL (ref 0.1–0.9)
Monocytes: 10 %
NEUTROS ABS: 2.6 10*3/uL (ref 1.4–7.0)
Neutrophils: 50 %
PLATELETS: 272 10*3/uL (ref 150–379)
RBC: 3.56 x10E6/uL — ABNORMAL LOW (ref 3.77–5.28)
RDW: 14.5 % (ref 12.3–15.4)
WBC: 5.2 10*3/uL (ref 3.4–10.8)

## 2016-07-21 LAB — COMPREHENSIVE METABOLIC PANEL
A/G RATIO: 1.5 (ref 1.2–2.2)
ALBUMIN: 4 g/dL (ref 3.5–4.8)
ALK PHOS: 76 IU/L (ref 39–117)
ALT: 18 IU/L (ref 0–32)
AST: 21 IU/L (ref 0–40)
BUN/Creatinine Ratio: 19 (ref 12–28)
BUN: 29 mg/dL — ABNORMAL HIGH (ref 8–27)
CO2: 21 mmol/L (ref 20–29)
CREATININE: 1.52 mg/dL — AB (ref 0.57–1.00)
Calcium: 9.2 mg/dL (ref 8.7–10.3)
Chloride: 105 mmol/L (ref 96–106)
GFR calc Af Amer: 38 mL/min/{1.73_m2} — ABNORMAL LOW (ref >59)
GFR, EST NON AFRICAN AMERICAN: 33 mL/min/{1.73_m2} — AB (ref >59)
GLUCOSE: 135 mg/dL — AB (ref 65–99)
Globulin, Total: 2.7 g/dL (ref 1.5–4.5)
Potassium: 4.4 mmol/L (ref 3.5–5.2)
SODIUM: 142 mmol/L (ref 134–144)
Total Protein: 6.7 g/dL (ref 6.0–8.5)

## 2016-07-21 LAB — CEA: CEA: 1.7 ng/mL (ref 0.0–4.7)

## 2016-07-21 NOTE — Pre-Procedure Instructions (Signed)
Spoke with Dr Randa Lynn regarding left bundle branch block-pt states this was the 1st EKG she ever had-Dr Penwarden requesting medical clearance.Faxed this clearance info and EKG to Dr Rosanna Randy and called Caryl-lyn @ Dr Curly Sh[place] office and informed her of this. Also faxed clearance info along with EKG to Dr Curly Shores office with fax confirmation received

## 2016-07-22 ENCOUNTER — Ambulatory Visit
Admission: RE | Admit: 2016-07-22 | Discharge: 2016-07-22 | Disposition: A | Discharge status: Home / Self Care | Payer: Medicare Other | Source: Ambulatory Visit | Attending: General Surgery | Admitting: General Surgery

## 2016-07-22 ENCOUNTER — Telehealth: Payer: Self-pay | Admitting: Emergency Medicine

## 2016-07-22 DIAGNOSIS — R933 Abnormal findings on diagnostic imaging of other parts of digestive tract: Secondary | ICD-10-CM | POA: Insufficient documentation

## 2016-07-22 DIAGNOSIS — C20 Malignant neoplasm of rectum: Secondary | ICD-10-CM

## 2016-07-22 DIAGNOSIS — I10 Essential (primary) hypertension: Secondary | ICD-10-CM | POA: Diagnosis not present

## 2016-07-22 DIAGNOSIS — K8020 Calculus of gallbladder without cholecystitis without obstruction: Secondary | ICD-10-CM | POA: Diagnosis not present

## 2016-07-22 DIAGNOSIS — K922 Gastrointestinal hemorrhage, unspecified: Secondary | ICD-10-CM | POA: Diagnosis not present

## 2016-07-22 DIAGNOSIS — Z01818 Encounter for other preprocedural examination: Secondary | ICD-10-CM

## 2016-07-22 DIAGNOSIS — I447 Left bundle-branch block, unspecified: Secondary | ICD-10-CM

## 2016-07-22 HISTORY — DX: Malignant neoplasm of rectum: C20

## 2016-07-22 MED ORDER — IOPAMIDOL (ISOVUE-300) INJECTION 61%
75.0000 mL | Freq: Once | INTRAVENOUS | Status: AC | PRN
  Administered 2016-07-22: 75 mL via INTRAVENOUS

## 2016-07-22 NOTE — Telephone Encounter (Signed)
Received fax from pre admit testing for a clearance for surgery pt is having with Dr. Terri Piedra on July 2nd. She had a LBBB on her EKG. Per Dr. Rosanna Randy Cardiology needs to clear her for this. Sent in urgent referral request. Pt wants to see Parachos.

## 2016-07-23 NOTE — Pre-Procedure Instructions (Signed)
Called over to Dr Marlan Palau office regarding clearance-spoke with [place], [place] and she said everything was faxed over yesterday to Dr Saralyn Pilar office- I called and spoke with Mardene Celeste at Dr Saralyn Pilar office and she states pt has appointment to be seen tomorrow @ 11 am

## 2016-07-24 DIAGNOSIS — E7800 Pure hypercholesterolemia, unspecified: Secondary | ICD-10-CM | POA: Diagnosis not present

## 2016-07-24 DIAGNOSIS — R011 Cardiac murmur, unspecified: Secondary | ICD-10-CM | POA: Diagnosis not present

## 2016-07-24 DIAGNOSIS — Z01810 Encounter for preprocedural cardiovascular examination: Secondary | ICD-10-CM | POA: Diagnosis not present

## 2016-07-24 DIAGNOSIS — I10 Essential (primary) hypertension: Secondary | ICD-10-CM | POA: Diagnosis not present

## 2016-07-24 DIAGNOSIS — I447 Left bundle-branch block, unspecified: Secondary | ICD-10-CM | POA: Diagnosis not present

## 2016-07-24 NOTE — Pre-Procedure Instructions (Signed)
Isaias Cowman, MD - 07/24/2016 11:00 AM EDT Formatting of this note may be different from the original. New Patient Visit   Chief Complaint: Chief Complaint  Patient presents with  . Abnormal ECG  . POC  colonoscopy  Date of Service: 07/24/2016 Date of Birth: 06/15/1940 PCP: Eulas Post, MD  History of Present Illness: Ms. Cadle is a 76 y.o.female patient who is referred for preoperative cardiovascular evaluation prior to colonoscopy, with history of essential hypertension, type 2 diabetes and systolic murmur. The patient's had a recent episode of rectal bleeding, CT scan revealing possible neoplasm in the mid descending colon. She is scheduled for colonoscopy on 07/27/2016. Preprocedure EKG revealed normal sinus rhythm with left bundle branch block. The patient is asymptomatic. She denies chest pain or shortness of breath. She denies palpitations or heart racing. She denies peripheral edema. The patient is active but does not exercise regularly.  The patient has essential hypertension, blood pressure well controlled on metoprolol succinate, HCTZ, and amlodipine/benazepril, which are well tolerated without apparent side effects. The patient follows a low-sodium, no added salt diet.  The patient has type 2 diabetes, currently on glipizide and Actos, which are well tolerated without apparent side effects, followed by her primary care provider. The patient follows a low carbohydrate, ADA diet.  Past Medical and Surgical History  Past Medical History Past Medical History:  Diagnosis Date  . Acid reflux  . Cancer (CMS-HCC)  colon  . Colon cancer (CMS-HCC)  . Diabetes mellitus type 2, uncomplicated (CMS-HCC)  . Heart murmur, unspecified  . Hypertension  . Hypothyroid, unspecified   Past Surgical History She has a past surgical history that includes Tonsillectomy.   Medications and Allergies  Current Medications Current Outpatient Prescriptions  Medication Sig Dispense Refill    . amLODIPine-benazepril (LOTREL) 5-10 mg capsule TAKE 1 CAPSULE BY MOUTH DAILY FOR BLOOD PRESSURE  . glipiZIDE (GLUCOTROL) 5 MG tablet TAKE 1 TABLET EVERY DAY FOR SUGAR CONTROL  . hydroCHLOROthiazide (MICROZIDE) 12.5 mg capsule TAKE ONE CAPSULE EVERYDAY FOR BLOOD PRESSURE/ FLUID  . levothyroxine (SYNTHROID, LEVOTHROID) 100 MCG tablet TAKE 1 TABLET EVERY DAY FOR THYROID  . loratadine (CLARITIN) 10 mg tablet Take by mouth.  . metoprolol succinate (TOPROL-XL) 200 MG XL tablet TAKE 1 TABLET BY MOUTH DAILY FOR BLOOD PRESSURE  . multivit-minerals/ferrous fum (MULTI VITAMIN ORAL) Take by mouth.  . naproxen sodium (ALEVE, ANAPROX) 220 MG tablet Take by mouth.  Marland Kitchen omeprazole (PRILOSEC OTC) 20 MG tablet Take by mouth.  . pioglitazone (ACTOS) 45 MG tablet Take by mouth.   No current facility-administered medications for this visit.   Allergies Adhesive and Codeine  Social and Family History  Social History reports that she has quit smoking. She has never used smokeless tobacco. She reports that she drinks alcohol. She reports that she does not use drugs.  Family History family history includes Alcohol abuse in her father; Cardiomyopathy (Abnormal function of the heart muscle) in her mother; Pancreatic cancer in her mother.   Review of Systems   Review of Systems: The patient denies chest pain, shortness of breath, orthopnea, paroxysmal nocturnal dyspnea, pedal edema, palpitations, heart racing, presyncope, syncope. Review of 12 Systems is negative except as described above.  Physical Examination   Vitals:BP 120/76  Pulse 82  Ht 160 cm (5\' 3" )  Wt 78.9 kg (174 lb)  BMI 30.82 kg/m  Ht:160 cm (5\' 3" ) Wt:78.9 kg (174 lb) GQQ:PYPP surface area is 1.87 meters squared. Body mass index is 30.82 kg/m.  HEENT: Pupils equally reactive to light and accomodation  Neck: Supple without thyromegaly, carotid pulses 2+ Lungs: clear to auscultation bilaterally; no wheezes, rales, rhonchi Heart: Regular  rate and rhythm. Grade 2/6 to 3/6 mid peaking systolic murmur Abdomen: soft nontender, nondistended, with normal bowel sounds Extremities: no cyanosis, clubbing, or edema Peripheral Pulses: 2+ in all extremities, 2+ femoral pulses bilaterally  Assessment   76 y.o. female with  1. Essential hypertension  2. Pure hypercholesterolemia  3. Preop cardiovascular exam  4. LBBB (left bundle branch block)  5. Heart murmur, systolic, unspecified   76 year old female with probable neoplasm in the mid descending colon, awaiting colonoscopy scheduled for 07/27/2016. Preprocedural ECG revealed sinus rhythm with left bundle branch block. Patient currently asymptomatic, without chest pain or shortness of breath. Patient should be at low risk for serious cardiovascular complication during low risk colonoscopy. Patient noted to have systolic murmur on physical examination.  Plan   1. Continue current medications 2. Counseled patient about low-sodium diet 3. DASH diet printed instructions given to the patient 4. Proceed with colonoscopy as planned 5. Continue metoprolol pre-, peri-and postoperatively 6. 2D echocardiogram to evaluate systolic murmur 7. Return to clinic after 2D echocardiogram  Orders Placed This Encounter  Procedures  . Echo complete   Return in about 2 weeks (around 08/07/2016), or after echo.  Isaias Cowman, MD PhD [facility]       Plan of Treatment - as of this encounter  Upcoming Encounters Upcoming Encounters  Date Type Specialty Care Team Description  08/24/2016 Appointment Cardiology Paraschos, Sheppard Coil, MD  [place]  [facility]  [place], [place] [address]  [phone]  [phone] ([phone]    08/27/2016 Office Visit Cardiology Isaias Cowman, MD  [facility]  [place], [place] [address]  [phone]  [phone] (Fax)     Scheduled Tests Scheduled Tests  Name Priority  Associated Diagnoses Order Schedule  Echo complete Routine Preop cardiovascular exam  Heart murmur, systolic, unspecified  1 Occurrences starting 07/24/2016 until 07/25/2017   Visit Diagnoses   Diagnosis  Essential hypertension - Primary  Pure hypercholesterolemia  Preop cardiovascular exam  Pre-operative cardiovascular examination   LBBB (left bundle branch block)  Other left bundle branch block   Heart murmur, systolic, unspecified   Historical Medications - added in this encounter  This list may reflect changes made after this encounter.  Medication Sig. Disp. Refills Start Date End Date  pioglitazone (ACTOS) 45 MG tablet  Take by mouth.   06/29/2016   omeprazole (PRILOSEC OTC) 20 MG tablet  Take by mouth.      naproxen sodium (ALEVE, ANAPROX) 220 MG tablet  Take by mouth.      multivit-minerals/ferrous fum (MULTI VITAMIN ORAL)  Take by mouth.      loratadine (CLARITIN) 10 mg tablet  Take by mouth.      levothyroxine (SYNTHROID, LEVOTHROID) 100 MCG tablet  TAKE 1 TABLET EVERY DAY FOR THYROID   08/13/2015   hydroCHLOROthiazide (MICROZIDE) 12.5 mg capsule  TAKE ONE CAPSULE EVERYDAY FOR BLOOD PRESSURE/ FLUID   05/29/2016   glipiZIDE (GLUCOTROL) 5 MG tablet  TAKE 1 TABLET EVERY DAY FOR SUGAR CONTROL   06/04/2016   metoprolol succinate (TOPROL-XL) 200 MG XL tablet  TAKE 1 TABLET BY MOUTH DAILY FOR BLOOD PRESSURE   07/06/2016 07/24/2016  amLODIPine-benazepril (LOTREL) 5-10 mg capsule  TAKE 1 CAPSULE BY MOUTH DAILY FOR BLOOD PRESSURE   07/06/2016 07/24/2016   Images Document Information  Primary Care Provider  Richard Ferdinand Lango MD (Jun. 27, 2018 - Present) [phone] (Work) [phone] (Fax) [address] [place], [place] [address]  Document Coverage Dates Jun. 29, 2018  [facility] [facility] [place], [place] [address]   Encounter Providers Isaias Cowman MD (Attending) [phone]  (Work) [phone] (Fax) [facility] [place], [place] [address]   Encounter Date Jun. 29, 2018

## 2016-07-24 NOTE — Pre-Procedure Instructions (Signed)
CARDIAC CLEARANCE FROM DR PARASCHOS IN [facility] AND CARE EVERYWHERE

## 2016-07-27 ENCOUNTER — Encounter: Payer: Self-pay | Admitting: *Deleted

## 2016-07-27 ENCOUNTER — Ambulatory Visit: Payer: Medicare Other | Admitting: Anesthesiology

## 2016-07-27 ENCOUNTER — Encounter
Admission: RE | Disposition: A | Discharge status: Home / Self Care | Payer: Self-pay | Source: Ambulatory Visit | Attending: General Surgery

## 2016-07-27 ENCOUNTER — Ambulatory Visit
Admission: RE | Admit: 2016-07-27 | Discharge: 2016-07-27 | Disposition: A | Discharge status: Home / Self Care | Payer: Medicare Other | Source: Ambulatory Visit | Attending: General Surgery | Admitting: General Surgery

## 2016-07-27 DIAGNOSIS — I10 Essential (primary) hypertension: Secondary | ICD-10-CM | POA: Diagnosis not present

## 2016-07-27 DIAGNOSIS — E119 Type 2 diabetes mellitus without complications: Secondary | ICD-10-CM | POA: Diagnosis not present

## 2016-07-27 DIAGNOSIS — C186 Malignant neoplasm of descending colon: Secondary | ICD-10-CM | POA: Insufficient documentation

## 2016-07-27 DIAGNOSIS — E7800 Pure hypercholesterolemia, unspecified: Secondary | ICD-10-CM | POA: Insufficient documentation

## 2016-07-27 DIAGNOSIS — Z87891 Personal history of nicotine dependence: Secondary | ICD-10-CM | POA: Insufficient documentation

## 2016-07-27 DIAGNOSIS — K625 Hemorrhage of anus and rectum: Secondary | ICD-10-CM | POA: Diagnosis not present

## 2016-07-27 DIAGNOSIS — K6289 Other specified diseases of anus and rectum: Secondary | ICD-10-CM

## 2016-07-27 DIAGNOSIS — C20 Malignant neoplasm of rectum: Secondary | ICD-10-CM | POA: Diagnosis not present

## 2016-07-27 DIAGNOSIS — K219 Gastro-esophageal reflux disease without esophagitis: Secondary | ICD-10-CM | POA: Insufficient documentation

## 2016-07-27 DIAGNOSIS — Z79899 Other long term (current) drug therapy: Secondary | ICD-10-CM | POA: Diagnosis not present

## 2016-07-27 DIAGNOSIS — E039 Hypothyroidism, unspecified: Secondary | ICD-10-CM | POA: Insufficient documentation

## 2016-07-27 DIAGNOSIS — Z7984 Long term (current) use of oral hypoglycemic drugs: Secondary | ICD-10-CM | POA: Insufficient documentation

## 2016-07-27 DIAGNOSIS — C19 Malignant neoplasm of rectosigmoid junction: Secondary | ICD-10-CM | POA: Insufficient documentation

## 2016-07-27 DIAGNOSIS — K602 Anal fissure, unspecified: Secondary | ICD-10-CM | POA: Diagnosis not present

## 2016-07-27 HISTORY — PX: RECTAL EXAM UNDER ANESTHESIA: SHX6399

## 2016-07-27 HISTORY — PX: TRANSANAL EXCISION OF RECTAL MASS: SHX6134

## 2016-07-27 HISTORY — PX: COLONOSCOPY: SHX5424

## 2016-07-27 LAB — GLUCOSE, CAPILLARY
GLUCOSE-CAPILLARY: 150 mg/dL — AB (ref 65–99)
Glucose-Capillary: 140 mg/dL — ABNORMAL HIGH (ref 65–99)

## 2016-07-27 SURGERY — EXAM UNDER ANESTHESIA, RECTUM
Anesthesia: General | Site: Rectum

## 2016-07-27 MED ORDER — PROPOFOL 500 MG/50ML IV EMUL
INTRAVENOUS | Status: AC
  Filled 2016-07-27: qty 50

## 2016-07-27 MED ORDER — SODIUM CHLORIDE 0.9 % IV SOLN: INTRAVENOUS | Status: DC

## 2016-07-27 MED ORDER — BUPIVACAINE-EPINEPHRINE (PF) 0.25% -1:200000 IJ SOLN
INTRAMUSCULAR | Status: DC | PRN
  Administered 2016-07-27: 30 mL

## 2016-07-27 MED ORDER — PROPOFOL 10 MG/ML IV BOLUS
INTRAVENOUS | Status: DC | PRN
  Administered 2016-07-27: 20 mg via INTRAVENOUS
  Administered 2016-07-27: 60 mg via INTRAVENOUS
  Administered 2016-07-27: 20 mg via INTRAVENOUS
  Administered 2016-07-27: 50 mg via INTRAVENOUS

## 2016-07-27 MED ORDER — ACETAMINOPHEN 10 MG/ML IV SOLN
INTRAVENOUS | Status: AC
  Filled 2016-07-27: qty 100

## 2016-07-27 MED ORDER — BUPIVACAINE LIPOSOME 1.3 % IJ SUSP
INTRAMUSCULAR | Status: AC
  Filled 2016-07-27: qty 20

## 2016-07-27 MED ORDER — SODIUM CHLORIDE 0.9 % IV SOLN
INTRAVENOUS | Status: DC
  Administered 2016-07-27: 08:00:00 via INTRAVENOUS

## 2016-07-27 MED ORDER — ACETAMINOPHEN 10 MG/ML IV SOLN
INTRAVENOUS | Status: DC | PRN
  Administered 2016-07-27: 1000 mg via INTRAVENOUS

## 2016-07-27 MED ORDER — HYDROCODONE-ACETAMINOPHEN 5-325 MG PO TABS: 1.0000 | ORAL_TABLET | ORAL | 0 refills | Status: DC | PRN

## 2016-07-27 MED ORDER — PROPOFOL 500 MG/50ML IV EMUL
INTRAVENOUS | Status: DC | PRN
  Administered 2016-07-27: 100 ug/kg/min via INTRAVENOUS

## 2016-07-27 MED ORDER — BUPIVACAINE-EPINEPHRINE (PF) 0.25% -1:200000 IJ SOLN
INTRAMUSCULAR | Status: AC
  Filled 2016-07-27: qty 30

## 2016-07-27 MED ORDER — BUPIVACAINE-EPINEPHRINE (PF) 0.5% -1:200000 IJ SOLN
INTRAMUSCULAR | Status: AC
  Filled 2016-07-27: qty 30

## 2016-07-27 MED ORDER — ONDANSETRON HCL 4 MG/2ML IJ SOLN: 4.0000 mg | Freq: Once | INTRAMUSCULAR | Status: DC | PRN

## 2016-07-27 MED ORDER — BUPIVACAINE LIPOSOME 1.3 % IJ SUSP
INTRAMUSCULAR | Status: DC | PRN
  Administered 2016-07-27: 20 mL

## 2016-07-27 MED ORDER — FENTANYL CITRATE (PF) 100 MCG/2ML IJ SOLN: 25.0000 ug | INTRAMUSCULAR | Status: DC | PRN

## 2016-07-27 SURGICAL SUPPLY — 31 items
BRIEF STRETCH MATERNITY 2XLG (MISCELLANEOUS) ×4 IMPLANT
CANISTER SUCT 1200ML W/VALVE (MISCELLANEOUS) ×4 IMPLANT
DRAPE LAPAROTOMY 100X77 ABD (DRAPES) ×4 IMPLANT
DRAPE LEGGINS SURG 28X43 STRL (DRAPES) ×4 IMPLANT
DRAPE UNDER BUTTOCK W/FLU (DRAPES) ×4 IMPLANT
DRSG GAUZE PETRO 6X36 STRIP ST (GAUZE/BANDAGES/DRESSINGS) IMPLANT
DRSG TELFA 3X8 NADH (GAUZE/BANDAGES/DRESSINGS) ×4 IMPLANT
ELECT REM PT RETURN 9FT ADLT (ELECTROSURGICAL) ×4
ELECTRODE REM PT RTRN 9FT ADLT (ELECTROSURGICAL) ×2 IMPLANT
GLOVE BIO SURGEON STRL SZ7.5 (GLOVE) ×12 IMPLANT
GLOVE INDICATOR 8.0 STRL GRN (GLOVE) ×12 IMPLANT
GOWN STRL REUS W/ TWL LRG LVL3 (GOWN DISPOSABLE) ×4 IMPLANT
GOWN STRL REUS W/TWL LRG LVL3 (GOWN DISPOSABLE) ×4
LABEL OR SOLS (LABEL) ×4 IMPLANT
NDL SAFETY 22GX1.5 (NEEDLE) ×4 IMPLANT
NEEDLE HYPO 25X1 1.5 SAFETY (NEEDLE) IMPLANT
PACK BASIN MINOR ARMC (MISCELLANEOUS) ×4 IMPLANT
PAD OB MATERNITY 4.3X12.25 (PERSONAL CARE ITEMS) ×4 IMPLANT
PAD PREP 24X41 OB/GYN DISP (PERSONAL CARE ITEMS) ×4 IMPLANT
SHEARS FOC LG CVD HARMONIC 17C (MISCELLANEOUS) ×4 IMPLANT
SPONGE XRAY 4X4 16PLY STRL (MISCELLANEOUS) ×4 IMPLANT
STRAP SAFETY BODY (MISCELLANEOUS) IMPLANT
SUCT SIGMOIDOSCOPE TIP 18 W/TU (SUCTIONS) ×4 IMPLANT
SURGILUBE 2OZ TUBE FLIPTOP (MISCELLANEOUS) ×4 IMPLANT
SUT CHROMIC 3 0 SH 27 (SUTURE) ×8 IMPLANT
SUT ETHILON 3-0 FS-10 30 BLK (SUTURE) ×4
SUT SILK 0 CT 1 30 (SUTURE) ×4 IMPLANT
SUT VIC AB 3-0 SH 27 (SUTURE)
SUT VIC AB 3-0 SH 27X BRD (SUTURE) IMPLANT
SUTURE EHLN 3-0 FS-10 30 BLK (SUTURE) ×2 IMPLANT
SYR CONTROL 10ML (SYRINGE) ×4 IMPLANT

## 2016-07-27 NOTE — Anesthesia Postprocedure Evaluation (Signed)
Anesthesia Post Note  Patient: Tracy Silva  Procedure(s) Performed: Procedure(s) (LRB): RECTAL EXAM UNDER ANESTHESIA (N/A) COLONOSCOPY IN O.R (N/A) EXCISION OF RECTAL MASS  Patient location during evaluation: PACU Anesthesia Type: General Level of consciousness: awake and alert Pain management: pain level controlled Vital Signs Assessment: post-procedure vital signs reviewed and stable Respiratory status: spontaneous breathing, nonlabored ventilation, respiratory function stable and patient connected to nasal cannula oxygen Cardiovascular status: blood pressure returned to baseline and stable Postop Assessment: no signs of nausea or vomiting Anesthetic complications: no     Last Vitals:  Vitals:   07/27/16 0944 07/27/16 0957  BP: 130/68 (!) 122/49  Pulse: 69 67  Resp: 18 17  Temp: 36.4 C     Last Pain:  Vitals:   07/27/16 0944  TempSrc:   PainSc: 0-No pain                 Martha Clan

## 2016-07-27 NOTE — Discharge Instructions (Signed)

## 2016-07-27 NOTE — Anesthesia Post-op Follow-up Note (Cosign Needed)
Anesthesia QCDR form completed.        

## 2016-07-27 NOTE — Op Note (Signed)
Preoperative diagnosis: Adenocarcinoma of the lower rectum, abnormal CT.  Postoperative diagnosis: Same.  Operative procedure: Colonoscopy with biopsy, transanal excision of low rectal adenocarcinoma.  Operating surgeon: Ollen Bowl, M.D.  Anesthesia: Propofol, exparel 20 cc; 0.25% Marcaine with 1-200,000 epinephrine, 30 mL.  Estimated blood loss: 5 mL.  Clinical note: This 76 year old woman recently presented with a history of rectal bleeding. There was a polypoid mass protruding from the anus and biopsy showed adenocarcinoma. She underwent CT scanning of the abdomen and pelvis with no evidence of metastatic disease, no pelvic nodes. There was evidence of circumferential involvement of the descending colon the midportion suggestive for malignancy. She is brought Today for planned colonoscopy, exam under anesthesia and possible excision of the previous identified adenocarcinoma the lower rectum.  The patient received preoperative antibiotics.  Operative note: Colonoscopy was completed on the gurney in the left lateral decubitus position and dictated separately.  After the patient was placed on the operating table in the dorsal lithotomy position the perineum was prepped with Betadine solution and draped. The above-mentioned local anesthetic was infiltrated and well tolerated. The mass was palpable at the 2:00 position. This was about a centimeter in diameter. Anoscopy showed this is extended down to the dentate line. Cautery was used to mark out an area 1 cm around the lesion. The Harmonic Scalpel was used which extract the lesion preserving the deep muscle layer. This was orientated, sutured Telfa and placed in formalin. The defect was closed with interrupted 3-0 chromic figure-of-eight sutures. Good hemostasis was noted.  The patient was taken recovery room in stable condition.

## 2016-07-27 NOTE — Transfer of Care (Signed)
Immediate Anesthesia Transfer of Care Note  Patient: Tracy Silva  Procedure(s) Performed: Procedure(s): RECTAL EXAM UNDER ANESTHESIA (N/A) COLONOSCOPY IN O.R (N/A) EXCISION OF RECTAL MASS  Patient Location: PACU  Anesthesia Type:MAC  Level of Consciousness: awake and alert   Airway & Oxygen Therapy: Patient Spontanous Breathing and Patient connected to face mask oxygen  Post-op Assessment: Report given to RN  Post vital signs: Reviewed and stable  Last Vitals:  Vitals:   07/27/16 0901 07/27/16 0902  BP:  (!) 118/52  Pulse:  77  Resp:    Temp: 36.1 C 36.4 C    Last Pain:  Vitals:   07/27/16 0607  TempSrc: Tympanic         Complications: No apparent anesthesia complications

## 2016-07-27 NOTE — H&P (Signed)
No change in clinical history or exam.  For colonoscopy, EUA, possible excision.

## 2016-07-27 NOTE — Op Note (Signed)
[facility] Patient Name: Tracy Silva Procedure Date: 07/27/2016 7:32 AM MRN: [id number] Account #: [id number] Date of Birth: [birth year] Admit Type: Outpatient Age: 76 Room: OR 8 Note Status: Finalized Procedure:            Colonoscopy Indications:          Abnormal CT of the GI tract Providers:            Robert Bellow, MD Medicines:            Monitored Anesthesia Care Complications:        No immediate complications. Procedure:            Pre-Anesthesia Assessment:                       - Prior to the procedure, a History and Physical was                        performed, and patient medications, allergies and                        sensitivities were reviewed. The patient's tolerance of                        previous anesthesia was reviewed.                       - The risks and benefits of the procedure and the                        sedation options and risks were discussed with the                        patient. All questions were answered and informed                        consent was obtained.                       - The risks and benefits of the procedure and the                        sedation options and risks were discussed with the                        patient. All questions were answered and informed                        consent was obtained.                       After obtaining informed consent, the colonoscope was                        passed under direct vision. Throughout the procedure,                        the patient's blood pressure, pulse, and oxygen                        saturations were monitored continuously. The  Colonoscope was introduced through the anus and                        advanced to the the cecum, identified by appendiceal                        orifice and ileocecal valve. The colonoscopy was                        somewhat difficult due to restricted mobility of the                         colon and significant looping. Successful completion of                        the procedure was aided by using manual pressure. The                        patient tolerated the procedure well. The quality of                        the bowel preparation was excellent. Findings:      An infiltrative partially obstructing large mass was found in the       descending colon and in the mid descending colon. The mass was       non-circumferential. The mass measured four cm in length. In addition,       its diameter measured six mm. No bleeding was present. This was biopsied       with a cold large-capacity forceps for histology.      A sessile non-obstructing small mass was found at the anus. The mass was       non-circumferential. The mass measured two cm in length. In addition,       its diameter measured five mm. No bleeding was present. Impression:           - Malignant partially obstructing tumor in the                        descending colon and in the mid descending colon.                        Biopsied.                       - Malignant tumor at the anus. Recommendation:       - Perform a rectal exam today. Procedure Code(s):    --- Professional ---                       [phone], Colonoscopy, flexible; with biopsy, single or                        multiple Diagnosis Code(s):    --- Professional ---                       C18.6, Malignant neoplasm of descending colon                       C21.0, Malignant neoplasm of anus, unspecified  R93.3, Abnormal findings on diagnostic imaging of other                        parts of digestive tract CPT copyright 2016 American Medical Association. All rights reserved. The codes documented in this report are preliminary and upon coder review may  be revised to meet current compliance requirements. Robert Bellow, MD 07/27/2016 8:15:19 AM This report has been signed electronically. Number of Addenda:  0 Note Initiated On: 07/27/2016 7:32 AM Scope Withdrawal Time: 0 hours 12 minutes 39 seconds  Total Procedure Duration: 0 hours 33 minutes 32 seconds       [facility]

## 2016-07-27 NOTE — Anesthesia Preprocedure Evaluation (Signed)
Anesthesia Evaluation  Patient identified by MRN, date of birth, ID band Patient awake    Reviewed: Allergy & Precautions, H&P , NPO status , Patient's Chart, lab work & pertinent test results, reviewed documented beta blocker date and time   History of Anesthesia Complications Negative for: history of anesthetic complications  Airway Mallampati: I  TM Distance: >3 FB Neck ROM: full    Dental  (+) Teeth Intact, Caps, Dental Advidsory Given Permanent bridge x2:   Pulmonary neg shortness of breath, asthma , neg COPD, neg recent URI, former smoker,           Cardiovascular Exercise Tolerance: Good hypertension, On Medications and On Home Beta Blockers (-) angina(-) CAD, (-) Past MI, (-) Cardiac Stents and (-) CABG (-) dysrhythmias + Valvular Problems/Murmurs      Neuro/Psych PSYCHIATRIC DISORDERS (Depression) negative neurological ROS     GI/Hepatic Neg liver ROS, GERD  ,  Endo/Other  diabetes, Well Controlled, Type 2, Oral Hypoglycemic AgentsHypothyroidism   Renal/GU negative Renal ROS  negative genitourinary   Musculoskeletal   Abdominal   Peds  Hematology  (+) Blood dyscrasia, anemia ,   Anesthesia Other Findings Past Medical History: No date: Acid reflux No date: Anemia     Comment: H/O AS A CHILD No date: Arthritis     Comment: FINGERS AND SHOULDER-RIGHT No date: Asthma     Comment: AS A CHILD No date: Diabetes mellitus without complication (HCC) No date: Heart murmur     Comment: ASYMPTOMATIC No date: High cholesterol No date: Hypertension No date: Hypothyroidism 06/27/2012: Other and unspecified noninfectious gastroente* 2011: Rectal bleeding 07/16/2016: Rectal cancer ([place])   Reproductive/Obstetrics negative OB ROS                             Anesthesia Physical Anesthesia Plan  ASA: III  Anesthesia Plan: General   Post-op Pain Management:    Induction:   PONV Risk  Score and Plan: 3 and Ondansetron, Dexamethasone and Propofol  Airway Management Planned: Natural Airway and Nasal Cannula  Additional Equipment:   Intra-op Plan:   Post-operative Plan:   Informed Consent: I have reviewed the patients History and Physical, chart, labs and discussed the procedure including the risks, benefits and alternatives for the proposed anesthesia with the patient or authorized representative who has indicated his/her understanding and acceptance.   Dental Advisory Given  Plan Discussed with: Anesthesiologist, CRNA and Surgeon  Anesthesia Plan Comments:         Anesthesia Quick Evaluation

## 2016-07-28 ENCOUNTER — Telehealth: Payer: Self-pay | Admitting: General Surgery

## 2016-07-28 ENCOUNTER — Encounter: Payer: Self-pay | Admitting: General Surgery

## 2016-07-28 NOTE — Telephone Encounter (Signed)
Unable to reach the patient at home.  Husband given the report: Confirmed adenocarcinoma the descending colon, matching CT and clinical impression of the time of yesterday's colonoscopy.  Adenocarcinoma of the rectum, negative margins on excision.  Patient will be contacted after her upcoming presentation at [facility] tumor board.

## 2016-07-30 ENCOUNTER — Encounter: Payer: Self-pay | Admitting: General Surgery

## 2016-07-30 ENCOUNTER — Ambulatory Visit (INDEPENDENT_AMBULATORY_CARE_PROVIDER_SITE_OTHER): Payer: Medicare Other | Admitting: General Surgery

## 2016-07-30 VITALS — BP 118/60 | HR 76 | Resp 12 | Ht 63.0 in | Wt 176.0 lb

## 2016-07-30 DIAGNOSIS — C20 Malignant neoplasm of rectum: Secondary | ICD-10-CM | POA: Diagnosis not present

## 2016-07-30 DIAGNOSIS — C186 Malignant neoplasm of descending colon: Secondary | ICD-10-CM | POA: Diagnosis not present

## 2016-07-30 NOTE — Progress Notes (Signed)
Patient ID: Tracy Silva, female   DOB: [birth year], 77 y.o.   MRN: [id number]  Chief Complaint  Patient presents with  . Follow-up    HPI Tracy Silva is a 76 y.o. female.  Here today for follow up of trans anal excision of mass. She states she is still a little sore but better, no rectal bleeding.  HPI  Past Medical History:  Diagnosis Date  . Acid reflux   . Anemia    H/O AS A CHILD  . Arthritis    FINGERS AND SHOULDER-RIGHT  . Asthma    AS A CHILD  . Diabetes mellitus without complication ([place])   . Heart murmur    ASYMPTOMATIC  . High cholesterol   . Hypertension   . Hypothyroidism   . Other and unspecified noninfectious gastroenteritis and colitis(558.9) 06/27/2012  . Rectal bleeding 2011  . Rectal cancer ([place]) 07/16/2016    Past Surgical History:  Procedure Laterality Date  . COLONOSCOPY  2011   X2  . COLONOSCOPY N/A 07/27/2016   Procedure: COLONOSCOPY IN O.R;  Surgeon: Robert Bellow, MD;  Location: [facility] ORS;  Service: General;  Laterality: N/A;  . IUD REMOVAL    . RECTAL EXAM UNDER ANESTHESIA N/A 07/27/2016   Procedure: RECTAL EXAM UNDER ANESTHESIA;  Surgeon: Robert Bellow, MD;  Location: [facility] ORS;  Service: General;  Laterality: N/A;  . TONSILLECTOMY    . TOOTH EXTRACTION    . TRANSANAL EXCISION OF RECTAL MASS  07/27/2016   Procedure: EXCISION OF RECTAL MASS;  Surgeon: Robert Bellow, MD;  Location: [facility] ORS;  Service: General;;  . WISDOM TOOTH EXTRACTION      Family History  Problem Relation Age of Onset  . Pancreatic cancer Mother   . Breast cancer Paternal Aunt     Social History Social History  Substance Use Topics  . Smoking status: Former Smoker    Packs/day: 0.50    Years: 4.00    Quit date: 01/26/1971  . Smokeless tobacco: Never Used  . Alcohol use Yes     Comment: occasionally with dinner    Allergies  Allergen Reactions  . Adhesive [Tape]     FOR  LONG PERIODS OF TIME  . Codeine Other (See Comments)    Headaches Heart  palpations    Current Outpatient Prescriptions  Medication Sig Dispense Refill  . amLODipine-benazepril (LOTREL) 5-10 MG capsule TAKE 1 CAPSULE BY MOUTH DAILY FOR BLOOD PRESSURE (Patient taking differently: TAKE 1 CAPSULE BY MOUTH DAILY FOR BLOOD PRESSURE-AM) 30 capsule 12  . glipiZIDE (GLUCOTROL) 5 MG tablet TAKE 1 TABLET EVERY DAY FOR SUGAR CONTROL 90 tablet 3  . hydrochlorothiazide (MICROZIDE) 12.5 MG capsule TAKE ONE CAPSULE EVERYDAY FOR BLOOD PRESSURE/ FLUID 90 capsule 3  . HYDROcodone-acetaminophen (NORCO) 5-325 MG tablet Take 1-2 tablets by mouth every 4 (four) hours as needed for moderate pain. 20 tablet 0  . levothyroxine (SYNTHROID, LEVOTHROID) 100 MCG tablet TAKE 1 TABLET EVERY DAY FOR THYROID (Patient taking differently: TAKE 1 TABLET EVERY DAY FOR THYROID-AM) 30 tablet 12  . loratadine (CLARITIN) 10 MG tablet Take 10 mg by mouth daily as needed for allergies.     . metoprolol (TOPROL-XL) 200 MG 24 hr tablet TAKE 1 TABLET BY MOUTH DAILY FOR BLOOD PRESSURE (Patient taking differently: TAKE 1 TABLET BY MOUTH DAILY FOR BLOOD PRESSURE-AM) 30 tablet 12  . Multiple Vitamins-Minerals (CENTRUM SILVER ADULT 50+ PO) Take 1 tablet by mouth daily.    . naproxen sodium (ANAPROX) 220  MG tablet Take 220 mg by mouth daily as needed (pain).     Marland Kitchen omeprazole (PRILOSEC OTC) 20 MG tablet Take 20 mg by mouth every morning.     . pioglitazone (ACTOS) 45 MG tablet Take 1 tablet (45 mg total) by mouth daily. 30 tablet 3   No current facility-administered medications for this visit.     Review of Systems Review of Systems  Constitutional: Negative.   Respiratory: Negative.   Cardiovascular: Negative.     Blood pressure 118/60, pulse 76, resp. rate 12, height 5\' 3"  (1.6 m), weight 176 lb (79.8 kg).  Physical Exam Physical Exam  Data Reviewed DIAGNOSIS:  A. MID DESCENDING COLON; BIOPSY:  - ADENOCARCINOMA.   B. RECTUM MASS, LOWER, 2:00; TRANSANAL EXCISION:  - INVASIVE ADENOCARCINOMA,  MODERATELY DIFFERENTIATED.  - SEE SUMMARY BELOW.   Surgical Pathology Cancer Case Summary   COLON AND RECTUM:  Procedure: Transanal excision  Tumor Site:  Rectum  Tumor Size: Greatest dimension: 1.8 cm  Macroscopic Tumor Perforation: Not specified  Histologic Type: Adenocarcinoma  Histologic Grade: G2, moderately differentiated  Tumor Extension: Tumor invades submucosa  Margins: All margins are uninvolved by invasive carcinoma, high-grade  dysplasia, intramucosal adenocarcinoma, and adenoma; distance from  radial margin: 6 mm  Treatment Effect: No known presurgical therapy  Lymphovascular Invasion: Not identified  Perineural Invasion: Not identified  Tumor Deposits: Not identified  Regional Lymph Nodes: None submitted or found  Pathologic Stage Classification (pTNM, AJCC 8th Edition): pT1 pNX  TNM Descriptors: Not applicable    CT scan of the abdomen and pelvis dated 07/22/2016 previously reviewed. IMPRESSION: Circumferential thickening within the mid descending colon hila suspicious for underlying neoplasm given the patient's clinical history. Special attention this area should be made on upcoming colonoscopy  The patient's known anal lesion is not well appreciated on this exam.  Cholelithiasis stable from the prior study.   CEA 0.0 - 4.7 ng/mL 1.7   Comment:    Roche ECLIA methodology    Nonsmokers <3.9     Assessment    Synchronous cancers of the colon including descending colon and low rectum.    Plan    The patient's case was presented at the [facility] tumor board. At present, would plan for an endorectal ultrasound in 3-6 months when the area is completely healed and inflammatory changes of resolved as no lesion is identified on CT imaging and margins were negative.  Patient would be a candidate for elective left hemicolectomy for management of the mid descending colon lesion.  The patient today came unaccompanied, in part because her husband hears  poorly and the need to repeat things for him to hear has interfered with her ability to have a steady stream of information during visits.  Risks of surgery were reviewed including those related to making use of another prep and the highly unlikely need for a stoma.  The patient will be contacted in the next few business days to arrange date this convenient for her schedule. She has had no symptoms related to her biliary tract (identified on CT scan in February 2011) and her gallbladder will not be removed at this time.        Robert Bellow 07/30/2016, 9:33 PM

## 2016-08-03 ENCOUNTER — Other Ambulatory Visit: Payer: Self-pay | Admitting: Family Medicine

## 2016-08-03 ENCOUNTER — Telehealth: Payer: Self-pay | Admitting: *Deleted

## 2016-08-03 ENCOUNTER — Ambulatory Visit: Payer: Self-pay | Admitting: General Surgery

## 2016-08-03 NOTE — Telephone Encounter (Signed)
Patient was contacted today to arrange for left colectomy. She was offered Friday, 08-21-16 for surgery date.   This patient states she is on clear liquids at this time due to stools that have been hard to pass and have been giving her discomfort. Patient wishes to wait until this has gotten better and she is feeling up to having surgery. She wishes to contact the office when she is ready to proceed.

## 2016-08-04 MED ORDER — POLYETHYLENE GLYCOL 3350 17 GM/SCOOP PO POWD: 1.0000 | Freq: Once | ORAL | 0 refills | Status: AC

## 2016-08-04 MED ORDER — NEOMYCIN SULFATE 500 MG PO TABS: 500.0000 mg | ORAL_TABLET | ORAL | 0 refills | Status: DC

## 2016-08-04 MED ORDER — METRONIDAZOLE 500 MG PO TABS: 500.0000 mg | ORAL_TABLET | ORAL | 0 refills | Status: DC

## 2016-08-04 NOTE — Telephone Encounter (Signed)
Spoke with patient about scheduling her surgery. The patient is scheduled for surgery at [facility] on 08/21/16. She will complete bowel prep with Antibiotics the day prior and will hold her Glipizide that day. Surgery instructions reviewed with patient and will be mailed to her. Prescription for Miralax, Flagyl, and Neomycin antibiotics have been sent into her pharmacy. The patient will call with any questions.

## 2016-08-05 ENCOUNTER — Ambulatory Visit (INDEPENDENT_AMBULATORY_CARE_PROVIDER_SITE_OTHER): Payer: Medicare Other | Admitting: General Surgery

## 2016-08-05 ENCOUNTER — Telehealth: Payer: Self-pay | Admitting: *Deleted

## 2016-08-05 VITALS — BP 130/82 | HR 72 | Resp 14 | Ht 65.0 in | Wt 175.0 lb

## 2016-08-05 DIAGNOSIS — C20 Malignant neoplasm of rectum: Secondary | ICD-10-CM

## 2016-08-05 DIAGNOSIS — K6289 Other specified diseases of anus and rectum: Secondary | ICD-10-CM

## 2016-08-05 DIAGNOSIS — C186 Malignant neoplasm of descending colon: Secondary | ICD-10-CM

## 2016-08-05 MED ORDER — LIDOCAINE 5 % EX OINT: 1.0000 "application " | TOPICAL_OINTMENT | Freq: Four times a day (QID) | CUTANEOUS | 0 refills | Status: DC | PRN

## 2016-08-05 NOTE — Telephone Encounter (Signed)
Appointment made for today per Dr Bary Castilla.

## 2016-08-05 NOTE — Telephone Encounter (Signed)
Patient called and is still having pain from surgery on 07/27/16 (Rectal Mass Removed) she also has pain with bowel movement. She started a liquid diet so she doesn't have to go to the bathroom as much. She was wondering if she could come in today to see Dr.Byrnett but I recommend talking to you first.

## 2016-08-05 NOTE — Progress Notes (Signed)
Patient ID: Tracy Silva, female   DOB: 1940/01/31, 76 y.o.   MRN: [id number]  Chief Complaint  Patient presents with  . Pain    HPI VENISHA Silva is a 76 y.o. female here today for to discuss pain when she moves her bowels. Patient states this has been going on for over a week. She started a liquid diet on Sunday, so she doesn't have to go to the bathroom as much. No fever or chills.                                                                                                                                              HPI  Past Medical History:  Diagnosis Date  . Acid reflux   . Anemia    H/O AS A CHILD  . Arthritis    FINGERS AND SHOULDER-RIGHT  . Asthma    AS A CHILD  . Diabetes mellitus without complication ([place])   . Heart murmur    ASYMPTOMATIC  . High cholesterol   . Hypertension   . Hypothyroidism   . Other and unspecified noninfectious gastroenteritis and colitis(558.9) 06/27/2012  . Rectal bleeding 2011  . Rectal cancer ([place]) 07/16/2016    Past Surgical History:  Procedure Laterality Date  . COLONOSCOPY  2011   X2  . COLONOSCOPY N/A 07/27/2016   Procedure: COLONOSCOPY IN O.R;  Surgeon: Robert Bellow, MD;  Location: [facility] ORS;  Service: General;  Laterality: N/A;  . IUD REMOVAL    . RECTAL EXAM UNDER ANESTHESIA N/A 07/27/2016   Procedure: RECTAL EXAM UNDER ANESTHESIA;  Surgeon: Robert Bellow, MD;  Location: [facility] ORS;  Service: General;  Laterality: N/A;  . TONSILLECTOMY    . TOOTH EXTRACTION    . TRANSANAL EXCISION OF RECTAL MASS  07/27/2016   Procedure: EXCISION OF RECTAL MASS;  Surgeon: Robert Bellow, MD;  Location: [facility] ORS;  Service: General;;  . WISDOM TOOTH EXTRACTION      Family History  Problem Relation Age of Onset  . Pancreatic cancer Mother   . Breast cancer Paternal Aunt     Social History Social History  Substance Use Topics  . Smoking status: Former Smoker    Packs/day: 0.50    Years: 4.00    Quit date: 01/26/1971  .  Smokeless tobacco: Never Used  . Alcohol use Yes     Comment: occasionally with dinner    Allergies  Allergen Reactions  . Adhesive [Tape]     FOR  LONG PERIODS OF TIME  . Codeine Other (See Comments)    Headaches Heart palpations    Current Outpatient Prescriptions  Medication Sig Dispense Refill  . amLODipine-benazepril (LOTREL) 5-10 MG capsule TAKE 1 CAPSULE BY MOUTH DAILY FOR BLOOD PRESSURE (Patient taking differently: TAKE 1 CAPSULE BY MOUTH DAILY FOR BLOOD PRESSURE-AM) 30 capsule 12  . glipiZIDE (GLUCOTROL) 5  MG tablet TAKE 1 TABLET EVERY DAY FOR SUGAR CONTROL 90 tablet 3  . hydrochlorothiazide (MICROZIDE) 12.5 MG capsule TAKE ONE CAPSULE EVERYDAY FOR BLOOD PRESSURE/ FLUID 90 capsule 3  . HYDROcodone-acetaminophen (NORCO) 5-325 MG tablet Take 1-2 tablets by mouth every 4 (four) hours as needed for moderate pain. 20 tablet 0  . levothyroxine (SYNTHROID, LEVOTHROID) 100 MCG tablet TAKE 1 TABLET EVERY DAY ON AN EMPTY STOMACH WITH A GLASS OF WATER AT LEAST 30-60 MINUTES BEFORE BREAKFAST FOR THYROID 90 tablet 3  . loratadine (CLARITIN) 10 MG tablet Take 10 mg by mouth daily as needed for allergies.     . metoprolol (TOPROL-XL) 200 MG 24 hr tablet TAKE 1 TABLET BY MOUTH DAILY FOR BLOOD PRESSURE (Patient taking differently: TAKE 1 TABLET BY MOUTH DAILY FOR BLOOD PRESSURE-AM) 30 tablet 12  . metroNIDAZOLE (FLAGYL) 500 MG tablet Take 1 tablet (500 mg total) by mouth See admin instructions. Take 1 tablet at 6 pm and take 1 tablet at 11 pm on 08/20/16 2 tablet 0  . Multiple Vitamins-Minerals (CENTRUM SILVER ADULT 50+ PO) Take 1 tablet by mouth daily.    . naproxen sodium (ANAPROX) 220 MG tablet Take 220 mg by mouth daily as needed (pain).     Marland Kitchen neomycin (MYCIFRADIN) 500 MG tablet Take 1 tablet (500 mg total) by mouth See admin instructions. Take 2 tablets at 6 pm, then take 2 tablets at 11 pm on 08/20/16. 4 tablet 0  . omeprazole (PRILOSEC OTC) 20 MG tablet Take 20 mg by mouth every morning.      . pioglitazone (ACTOS) 45 MG tablet Take 1 tablet (45 mg total) by mouth daily. 30 tablet 3  . lidocaine (XYLOCAINE) 5 % ointment Apply 1 application topically 4 (four) times daily as needed. 35.44 g 0   No current facility-administered medications for this visit.     Review of Systems Review of Systems  Constitutional: Negative.   Respiratory: Negative.   Cardiovascular: Negative.     Blood pressure 130/82, pulse 72, resp. rate 14, height 5\' 5"  (1.651 m), weight 175 lb (79.4 kg).  Physical Exam Physical Exam  Constitutional: She is oriented to person, place, and time. She appears well-developed and well-nourished.  Cardiovascular: Normal rate and regular rhythm.   Pulmonary/Chest: Effort normal and breath sounds normal.  Genitourinary:     Neurological: She is alert and oriented to person, place, and time.  Skin: Skin is warm and dry.      Assessment    Perirectal pain secondary to recent excision of rectal tumor.    Plan    Topical therapy with lidocaine 5% ointment.  Plans for left colectomy later this month.    HPI, Physical Exam, Assessment and Plan have been scribed under the direction and in the presence of Hervey Ard, MD.  Gaspar Cola, CMA  I have completed the exam and reviewed the above documentation for accuracy and completeness.  I agree with the above.  Haematologist has been used and any errors in dictation or transcription are unintentional.  Hervey Ard, M.D., F.A.C.S.  Robert Bellow 08/05/2016, 10:00 PM

## 2016-08-07 ENCOUNTER — Telehealth: Payer: Self-pay

## 2016-08-07 ENCOUNTER — Ambulatory Visit (INDEPENDENT_AMBULATORY_CARE_PROVIDER_SITE_OTHER): Payer: Medicare Other | Admitting: Physician Assistant

## 2016-08-07 ENCOUNTER — Encounter: Payer: Self-pay | Admitting: Physician Assistant

## 2016-08-07 VITALS — BP 136/62 | HR 72 | Temp 98.2°F | Resp 16 | Wt 172.0 lb

## 2016-08-07 DIAGNOSIS — I10 Essential (primary) hypertension: Secondary | ICD-10-CM

## 2016-08-07 DIAGNOSIS — R040 Epistaxis: Secondary | ICD-10-CM

## 2016-08-07 NOTE — Patient Instructions (Signed)
Nosebleed A nosebleed is when blood comes out of the nose. Nosebleeds are common. They are usually not a sign of a serious medical condition. Follow these instructions at home:  Try controlling your nosebleed by pinching your nostrils gently. Do this for at least 10 minutes.  Avoid blowing or sniffing your nose for a number of hours after having a nosebleed.  Do not put gauze inside of your nose yourself. If your nose was packed by your doctor, try to keep the pack inside of your nose until your doctor removes it. ? If a gauze pack was used and it starts to fall out, gently replace it or cut off the end of it. ? If a balloon catheter was used to pack your nose, do not cut or remove it unless told by your doctor.  Avoid lying down while you are having a nosebleed. Sit up and lean forward.  Use a nasal spray decongestant to help with a nosebleed as told by your doctor.  Do not use petroleum jelly or mineral oil in your nose. These can drip into your lungs.  Keep your house humid by using: ? Less air conditioning. ? A humidifier.  Aspirin and blood thinners make bleeding more likely. If you are prescribed these medicines and you have nosebleeds, ask your doctor if you should stop taking the medicines or adjust the dose. Do not stop medicines unless told by your doctor.  Resume your normal activities as you are able. Avoid straining, lifting, or bending at your waist for several days.  If your nosebleed was caused by dryness in your nose, use over-the-counter saline nasal spray or gel. If you must use a lubricant: ? Choose one that is water-soluble. ? Use it only as needed. ? Do not use it within several hours of lying down.  Keep all follow-up visits as told by your doctor. This is important. Contact a health care provider if:  You have a fever.  You get frequent nosebleeds.  You are getting nosebleeds more often. Get help right away if:  Your nosebleed lasts longer than 20  minutes.  Your nosebleed occurs after an injury to your face, and your nose looks crooked or broken.  You have unusual bleeding from other parts of your body.  You have unusual bruising on other parts of your body.  You feel light-headed or dizzy.  You become sweaty.  You throw up (vomit) blood.  You have a nosebleed after a head injury. This information is not intended to replace advice given to you by your health care provider. Make sure you discuss any questions you have with your health care provider. Document Released: 10/22/2007 Document Revised: 08/16/2015 Document Reviewed: 08/28/2013 Elsevier Interactive Patient Education  [employer].

## 2016-08-07 NOTE — Telephone Encounter (Signed)
Patient called c/o nosebleeds that started since last night. She reports that it she started off with a sore throat, and decided to take an aleve. She states that 1/2-1hr later her nose started to bleed lasting 1-80mins. Later on that evening, she reports that she felt a "lump" in her throat and she coughed up a quarter sized blood clot. She also mentions that her BP was elevated at 181/80. Before she went to bed she checked her BP and it was still elevated at 159/82.   When she woke up this morning, she reports that she still has bright pink blood coming from her nose. She is concerned because she is having surgery at the end of the month and wants to make sure that nothing is wrong. Per Tawanna Sat, patient can come in for evaluation later this afternoon. Appt was scheduled and patient has been advised.

## 2016-08-07 NOTE — Progress Notes (Signed)
Patient: Tracy Silva Female    DOB: [birth year]   76 y.o.   MRN: [id number] Visit Date: 08/07/2016  Today's Provider: Mar Daring, PA-C   Chief Complaint  Patient presents with  . Hypertension  . Epistaxis   Subjective:    HPI  Patient here today C/O nose bleed last night and this morning lasting for about 10 minutes. Patient reports that both times she noticed a blood clot, last night bigger than today. Patient reports that both times she checked her BP and reports 181/86 last night and today 160/86. Patient denies any chest pain, shortness of breath, swelling or headaches. Patient reports she will undergo surgery on 08/21/2016 with Dr. Bary Castilla for colon cancer. Patient reports she is just wanting to make sure she will not need to cancel or reschedule her surgery.     Allergies  Allergen Reactions  . Adhesive [Tape]     FOR  LONG PERIODS OF TIME  . Codeine Other (See Comments)    Headaches Heart palpations     Current Outpatient Prescriptions:  .  amLODipine-benazepril (LOTREL) 5-10 MG capsule, TAKE 1 CAPSULE BY MOUTH DAILY FOR BLOOD PRESSURE (Patient taking differently: TAKE 1 CAPSULE BY MOUTH DAILY FOR BLOOD PRESSURE-AM), Disp: 30 capsule, Rfl: 12 .  glipiZIDE (GLUCOTROL) 5 MG tablet, TAKE 1 TABLET EVERY DAY FOR SUGAR CONTROL, Disp: 90 tablet, Rfl: 3 .  hydrochlorothiazide (MICROZIDE) 12.5 MG capsule, TAKE ONE CAPSULE EVERYDAY FOR BLOOD PRESSURE/ FLUID, Disp: 90 capsule, Rfl: 3 .  HYDROcodone-acetaminophen (NORCO) 5-325 MG tablet, Take 1-2 tablets by mouth every 4 (four) hours as needed for moderate pain., Disp: 20 tablet, Rfl: 0 .  levothyroxine (SYNTHROID, LEVOTHROID) 100 MCG tablet, TAKE 1 TABLET EVERY DAY ON AN EMPTY STOMACH WITH A GLASS OF WATER AT LEAST 30-60 MINUTES BEFORE BREAKFAST FOR THYROID, Disp: 90 tablet, Rfl: 3 .  lidocaine (XYLOCAINE) 5 % ointment, Apply 1 application topically 4 (four) times daily as needed., Disp: 35.44 g, Rfl: 0 .  loratadine  (CLARITIN) 10 MG tablet, Take 10 mg by mouth daily as needed for allergies. , Disp: , Rfl:  .  metoprolol (TOPROL-XL) 200 MG 24 hr tablet, TAKE 1 TABLET BY MOUTH DAILY FOR BLOOD PRESSURE (Patient taking differently: TAKE 1 TABLET BY MOUTH DAILY FOR BLOOD PRESSURE-AM), Disp: 30 tablet, Rfl: 12 .  metroNIDAZOLE (FLAGYL) 500 MG tablet, Take 1 tablet (500 mg total) by mouth See admin instructions. Take 1 tablet at 6 pm and take 1 tablet at 11 pm on 08/20/16, Disp: 2 tablet, Rfl: 0 .  Multiple Vitamins-Minerals (CENTRUM SILVER ADULT 50+ PO), Take 1 tablet by mouth daily., Disp: , Rfl:  .  naproxen sodium (ANAPROX) 220 MG tablet, Take 220 mg by mouth daily as needed (pain). , Disp: , Rfl:  .  neomycin (MYCIFRADIN) 500 MG tablet, Take 1 tablet (500 mg total) by mouth See admin instructions. Take 2 tablets at 6 pm, then take 2 tablets at 11 pm on 08/20/16., Disp: 4 tablet, Rfl: 0 .  omeprazole (PRILOSEC OTC) 20 MG tablet, Take 20 mg by mouth every morning. , Disp: , Rfl:  .  pioglitazone (ACTOS) 45 MG tablet, Take 1 tablet (45 mg total) by mouth daily., Disp: 30 tablet, Rfl: 3  Review of Systems  Constitutional: Negative.   HENT: Positive for nosebleeds. Negative for congestion, ear discharge, ear pain, postnasal drip, rhinorrhea, sinus pain, sinus pressure, sore throat and trouble swallowing.   Respiratory: Negative.   Cardiovascular:  Negative.   Gastrointestinal: Negative.   Neurological: Negative for dizziness, syncope, light-headedness and headaches.    Social History  Substance Use Topics  . Smoking status: Former Smoker    Packs/day: 0.50    Years: 4.00    Quit date: 01/26/1971  . Smokeless tobacco: Never Used  . Alcohol use Yes     Comment: occasionally with dinner   Objective:   BP 136/62 (BP Location: Left Arm, Patient Position: Sitting, Cuff Size: Large)   Pulse 72   Temp 98.2 F (36.8 C)   Resp 16   Wt 172 lb (78 kg)   SpO2 98%   BMI 28.62 kg/m  Vitals:   08/07/16 1416  BP:  136/62  Pulse: 72  Resp: 16  Temp: 98.2 F (36.8 C)  SpO2: 98%  Weight: 172 lb (78 kg)     Physical Exam  Constitutional: She appears well-developed and well-nourished. No distress.  HENT:  Head: Normocephalic and atraumatic.  Right Ear: Hearing, tympanic membrane, external ear and ear canal normal.  Left Ear: Hearing, tympanic membrane, external ear and ear canal normal.  Nose: No mucosal edema or rhinorrhea. Epistaxis is observed.  Mouth/Throat: Uvula is midline, oropharynx is clear and moist and mucous membranes are normal. No oropharyngeal exudate.  Erythematous nasal mucosa with some dried blood in the right nare  Eyes: Pupils are equal, round, and reactive to light. Conjunctivae are normal. Right eye exhibits no discharge. Left eye exhibits no discharge. No scleral icterus.  Neck: Normal range of motion. Neck supple. No tracheal deviation present. No thyromegaly present.  Cardiovascular: Normal rate, regular rhythm and normal heart sounds.  Exam reveals no gallop and no friction rub.   No murmur heard. Pulmonary/Chest: Effort normal and breath sounds normal. No stridor. No respiratory distress. She has no wheezes. She has no rales.  Lymphadenopathy:    She has no cervical adenopathy.  Skin: Skin is warm and dry. She is not diaphoretic.  Vitals reviewed.      Assessment & Plan:     1. Bleeding from the nose Suspect multifactorial nose bleed. I think her BP is slightly increased from anxiety of her upcoming surgery, hemicolectomy, on 08/21/16, plus addition of Aleve which could have increased her BP and dry conditions due to loratadine use. She was also checking her BP after the bleeding, instead of having it checked before, which is why I think these levels were higher. BP today in the office was more normal. Platelets were normal on 07/24/16. Advised patient to avoid NSAID use. May use tylenol. She is to call if no improvement.   2. Essential hypertension Better today. See  above medical treatment plan.       Mar Daring, PA-C  [facility]

## 2016-08-12 ENCOUNTER — Encounter
Admission: RE | Admit: 2016-08-12 | Discharge: 2016-08-12 | Disposition: A | Discharge status: Home / Self Care | Payer: Medicare Other | Source: Ambulatory Visit | Attending: General Surgery | Admitting: General Surgery

## 2016-08-12 NOTE — Patient Instructions (Signed)
  Your procedure is scheduled on: 08-21-16 FRIDAY Report to Same Day Surgery 2nd floor medical mall [facility] Entrance-take elevator on left to 2nd floor.  Check in with surgery information desk.) To find out your arrival time please call [phone] between 1PM - 3PM on 08-20-16 THURSDAY  Remember: Instructions that are not followed completely may result in serious medical risk, up to and including death, or upon the discretion of your surgeon and anesthesiologist your surgery may need to be rescheduled.    _x___ 1. Do not eat food or drink liquids after midnight. No gum chewing or hard candies.     __x__ 2. No Alcohol for 24 hours before or after surgery.   __x__3. No Smoking for 24 prior to surgery.   ____  4. Bring all medications with you on the day of surgery if instructed.    __x__ 5. Notify your doctor if there is any change in your medical condition     (cold, fever, infections).     Do not wear jewelry, make-up, hairpins, clips or nail polish.  Do not wear lotions, powders, or perfumes. You may wear deodorant.  Do not shave 48 hours prior to surgery. Men may shave face and neck.  Do not bring valuables to the hospital.    [facility] is not responsible for any belongings or valuables.               Contacts, dentures or bridgework may not be worn into surgery.  Leave your suitcase in the car. After surgery it may be brought to your room.  For patients admitted to the hospital, discharge time is determined by your treatment team.   Patients discharged the day of surgery will not be allowed to drive home.  You will need someone to drive you home and stay with you the night of your procedure.    Please read over the following fact sheets that you were given:   [facility] Preparing for Surgery and or MRSA Information   _x___ TAKE THE FOLLOWING MEDICATIONS THE MORNING OF SURGERY WITH A SMALL SIP OF WATER. These include:  1. LOTREL (AMLODIPINE-BENAZEPRIL)  2. LEVOTHYROXINE  (SYNTHROID)  3. METOPROLOL  4.  5.  6.  ____Fleets enema or Magnesium Citrate as directed.   _x___ Use CHG Soap or sage wipes as directed on instruction sheet   ____ Use inhalers on the day of surgery and bring to hospital day of surgery  ____ Stop Metformin and Janumet 2 days prior to surgery.    ____ Take 1/2 of usual insulin dose the night before surgery and none on the morning surgery.   ____ Follow recommendations from Cardiologist, Pulmonologist or PCP regarding stopping Aspirin, Coumadin, Pllavix ,Eliquis, Effient, or Pradaxa, and Pletal.  ____Stop Anti-inflammatories such as Advil, Aleve, Ibuprofen, Motrin, Naproxen, Naprosyn, Goodies powders or aspirin products. OK to take Tylenol    ____ Stop supplements until after surgery.     ____ Bring C-Pap to the hospital.

## 2016-08-12 NOTE — Pre-Procedure Instructions (Signed)
Isaias Cowman, MD - 07/24/2016 11:00 AM EDT Formatting of this note may be different from the original. New Patient Visit   Chief Complaint: Chief Complaint  Patient presents with  . Abnormal ECG  . POC  colonoscopy  Date of Service: 07/24/2016 Date of Birth: 21-Feb-1940 PCP: Eulas Post, MD  History of Present Illness: Ms. Rom is a 76 y.o.female patient who is referred for preoperative cardiovascular evaluation prior to colonoscopy, with history of essential hypertension, type 2 diabetes and systolic murmur. The patient's had a recent episode of rectal bleeding, CT scan revealing possible neoplasm in the mid descending colon. She is scheduled for colonoscopy on 07/27/2016. Preprocedure EKG revealed normal sinus rhythm with left bundle branch block. The patient is asymptomatic. She denies chest pain or shortness of breath. She denies palpitations or heart racing. She denies peripheral edema. The patient is active but does not exercise regularly.  The patient has essential hypertension, blood pressure well controlled on metoprolol succinate, HCTZ, and amlodipine/benazepril, which are well tolerated without apparent side effects. The patient follows a low-sodium, no added salt diet.  The patient has type 2 diabetes, currently on glipizide and Actos, which are well tolerated without apparent side effects, followed by her primary care provider. The patient follows a low carbohydrate, ADA diet.  Past Medical and Surgical History  Past Medical History Past Medical History:  Diagnosis Date  . Acid reflux  . Cancer (CMS-HCC)  colon  . Colon cancer (CMS-HCC)  . Diabetes mellitus type 2, uncomplicated (CMS-HCC)  . Heart murmur, unspecified  . Hypertension  . Hypothyroid, unspecified   Past Surgical History She has a past surgical history that includes Tonsillectomy.   Medications and Allergies  Current Medications Current Outpatient Prescriptions  Medication Sig Dispense Refill    . amLODIPine-benazepril (LOTREL) 5-10 mg capsule TAKE 1 CAPSULE BY MOUTH DAILY FOR BLOOD PRESSURE  . glipiZIDE (GLUCOTROL) 5 MG tablet TAKE 1 TABLET EVERY DAY FOR SUGAR CONTROL  . hydroCHLOROthiazide (MICROZIDE) 12.5 mg capsule TAKE ONE CAPSULE EVERYDAY FOR BLOOD PRESSURE/ FLUID  . levothyroxine (SYNTHROID, LEVOTHROID) 100 MCG tablet TAKE 1 TABLET EVERY DAY FOR THYROID  . loratadine (CLARITIN) 10 mg tablet Take by mouth.  . metoprolol succinate (TOPROL-XL) 200 MG XL tablet TAKE 1 TABLET BY MOUTH DAILY FOR BLOOD PRESSURE  . multivit-minerals/ferrous fum (MULTI VITAMIN ORAL) Take by mouth.  . naproxen sodium (ALEVE, ANAPROX) 220 MG tablet Take by mouth.  Marland Kitchen omeprazole (PRILOSEC OTC) 20 MG tablet Take by mouth.  . pioglitazone (ACTOS) 45 MG tablet Take by mouth.   No current facility-administered medications for this visit.   Allergies Adhesive and Codeine  Social and Family History  Social History reports that she has quit smoking. She has never used smokeless tobacco. She reports that she drinks alcohol. She reports that she does not use drugs.  Family History family history includes Alcohol abuse in her father; Cardiomyopathy (Abnormal function of the heart muscle) in her mother; Pancreatic cancer in her mother.   Review of Systems   Review of Systems: The patient denies chest pain, shortness of breath, orthopnea, paroxysmal nocturnal dyspnea, pedal edema, palpitations, heart racing, presyncope, syncope. Review of 12 Systems is negative except as described above.  Physical Examination   Vitals:BP 120/76  Pulse 82  Ht 160 cm (5\' 3" )  Wt 78.9 kg (174 lb)  BMI 30.82 kg/m  Ht:160 cm (5\' 3" ) Wt:78.9 kg (174 lb) HYI:FOYD surface area is 1.87 meters squared. Body mass index is 30.82 kg/m.  HEENT: Pupils equally reactive to light and accomodation  Neck: Supple without thyromegaly, carotid pulses 2+ Lungs: clear to auscultation bilaterally; no wheezes, rales, rhonchi Heart: Regular  rate and rhythm. Grade 2/6 to 3/6 mid peaking systolic murmur Abdomen: soft nontender, nondistended, with normal bowel sounds Extremities: no cyanosis, clubbing, or edema Peripheral Pulses: 2+ in all extremities, 2+ femoral pulses bilaterally  Assessment   76 y.o. female with  1. Essential hypertension  2. Pure hypercholesterolemia  3. Preop cardiovascular exam  4. LBBB (left bundle branch block)  5. Heart murmur, systolic, unspecified   76 year old female with probable neoplasm in the mid descending colon, awaiting colonoscopy scheduled for 07/27/2016. Preprocedural ECG revealed sinus rhythm with left bundle branch block. Patient currently asymptomatic, without chest pain or shortness of breath. Patient should be at low risk for serious cardiovascular complication during low risk colonoscopy. Patient noted to have systolic murmur on physical examination.  Plan   1. Continue current medications 2. Counseled patient about low-sodium diet 3. DASH diet printed instructions given to the patient 4. Proceed with colonoscopy as planned 5. Continue metoprolol pre-, peri-and postoperatively 6. 2D echocardiogram to evaluate systolic murmur 7. Return to clinic after 2D echocardiogram  Orders Placed This Encounter  Procedures  . Echo complete   Return in about 2 weeks (around 08/07/2016), or after echo.  Isaias Cowman, MD PhD [facility]       Plan of Treatment - as of this encounter  Scheduled Tests Scheduled Tests  Name Priority Associated Diagnoses Order Schedule  Echo complete Routine Preop cardiovascular exam  Heart murmur, systolic, unspecified  1 Occurrences starting 07/24/2016 until 07/25/2017   Procedures - in this encounter  Procedure Name Priority Date/Time Associated Diagnosis Comments  ECG  07/21/2016 12:00 AM EDT  Results for this procedure are in the results section.    ECG Results - in this encounter   ECG ECG  Narrative Performed At  This result has  an attachment that is not available.  Ordered by an unspecified provider.        Visit Diagnoses   Diagnosis  Essential hypertension - Primary  Pure hypercholesterolemia  Preop cardiovascular exam  Pre-operative cardiovascular examination   LBBB (left bundle branch block)  Other left bundle branch block   Heart murmur, systolic, unspecified   Historical Medications - added in this encounter  This list may reflect changes made after this encounter.  Medication Sig. Disp. Refills Start Date End Date  pioglitazone (ACTOS) 45 MG tablet  Take by mouth.   06/29/2016   omeprazole (PRILOSEC OTC) 20 MG tablet  Take by mouth.      naproxen sodium (ALEVE, ANAPROX) 220 MG tablet  Take by mouth.      multivit-minerals/ferrous fum (MULTI VITAMIN ORAL)  Take by mouth.      loratadine (CLARITIN) 10 mg tablet  Take by mouth.      levothyroxine (SYNTHROID, LEVOTHROID) 100 MCG tablet  TAKE 1 TABLET EVERY DAY FOR THYROID   08/13/2015   hydroCHLOROthiazide (MICROZIDE) 12.5 mg capsule  TAKE ONE CAPSULE EVERYDAY FOR BLOOD PRESSURE/ FLUID   05/29/2016   glipiZIDE (GLUCOTROL) 5 MG tablet  TAKE 1 TABLET EVERY DAY FOR SUGAR CONTROL   06/04/2016   metoprolol succinate (TOPROL-XL) 200 MG XL tablet  TAKE 1 TABLET BY MOUTH DAILY FOR BLOOD PRESSURE   07/06/2016 07/24/2016  amLODIPine-benazepril (LOTREL) 5-10 mg capsule  TAKE 1 CAPSULE BY MOUTH DAILY FOR BLOOD PRESSURE   07/06/2016 07/24/2016   Images Document Information  Primary  Care Provider Dwaine Gale MD (Jun. 27, 2018 - Present) [phone] (Work) [phone] (Fax) [address] [place], [place] [address]  Document Coverage Dates Jun. 29, 2018  [address] [place], [place] [address]   Encounter Providers Isaias Cowman MD (Attending) [phone] (Work) [phone] (Fax) [place] [facility] [place],  [id number]   Encounter Date Jun. 29,

## 2016-08-18 ENCOUNTER — Encounter
Admission: RE | Admit: 2016-08-18 | Discharge: 2016-08-18 | Disposition: A | Discharge status: Home / Self Care | Payer: Medicare Other | Source: Ambulatory Visit | Attending: General Surgery | Admitting: General Surgery

## 2016-08-18 ENCOUNTER — Ambulatory Visit (INDEPENDENT_AMBULATORY_CARE_PROVIDER_SITE_OTHER): Payer: Medicare Other | Admitting: Family Medicine

## 2016-08-18 ENCOUNTER — Ambulatory Visit (INDEPENDENT_AMBULATORY_CARE_PROVIDER_SITE_OTHER): Payer: Medicare Other | Admitting: General Surgery

## 2016-08-18 ENCOUNTER — Encounter: Payer: Self-pay | Admitting: General Surgery

## 2016-08-18 VITALS — BP 108/56 | HR 82 | Resp 12 | Ht 66.0 in | Wt 169.0 lb

## 2016-08-18 VITALS — BP 98/54 | HR 82 | Temp 97.9°F | Resp 12 | Wt 170.0 lb

## 2016-08-18 DIAGNOSIS — C186 Malignant neoplasm of descending colon: Secondary | ICD-10-CM | POA: Diagnosis not present

## 2016-08-18 DIAGNOSIS — Z789 Other specified health status: Secondary | ICD-10-CM

## 2016-08-18 DIAGNOSIS — E119 Type 2 diabetes mellitus without complications: Secondary | ICD-10-CM

## 2016-08-18 DIAGNOSIS — E784 Other hyperlipidemia: Secondary | ICD-10-CM

## 2016-08-18 DIAGNOSIS — E039 Hypothyroidism, unspecified: Secondary | ICD-10-CM | POA: Diagnosis not present

## 2016-08-18 DIAGNOSIS — E7849 Other hyperlipidemia: Secondary | ICD-10-CM

## 2016-08-18 DIAGNOSIS — Z01812 Encounter for preprocedural laboratory examination: Secondary | ICD-10-CM

## 2016-08-18 DIAGNOSIS — I10 Essential (primary) hypertension: Secondary | ICD-10-CM

## 2016-08-18 LAB — SURGICAL PCR SCREEN

## 2016-08-18 MED ORDER — EZETIMIBE 10 MG PO TABS: 10.0000 mg | ORAL_TABLET | Freq: Every morning | ORAL | 12 refills | Status: DC

## 2016-08-18 NOTE — Progress Notes (Addendum)
Patient ID: Tracy Silva, female   DOB: 1940-05-15, 76 y.o.   MRN: [id number]  Chief Complaint  Patient presents with  . Pre-op Exam    colectomy    HPI Tracy Silva is a 76 y.o. female here today for her pre op colectomy scheduled for 08/21/2016. Patient states she has been having some bleeding light pink perianal drainage.  HPI  Past Medical History:  Diagnosis Date  . Acid reflux   . Anemia    H/O AS A CHILD  . Arthritis    FINGERS AND SHOULDER-RIGHT  . Asthma    AS A CHILD  . Diabetes mellitus without complication ([place])   . Heart murmur    ASYMPTOMATIC  . High cholesterol   . Hypertension   . Hypothyroidism   . Other and unspecified noninfectious gastroenteritis and colitis(558.9) 06/27/2012  . Rectal bleeding 2011  . Rectal cancer ([place]) 07/16/2016    Past Surgical History:  Procedure Laterality Date  . COLONOSCOPY  2011   X2  . COLONOSCOPY N/A 07/27/2016   Procedure: COLONOSCOPY IN O.R;  Surgeon: Robert Bellow, MD;  Location: [facility] ORS;  Service: General;  Laterality: N/A;  . IUD REMOVAL    . RECTAL EXAM UNDER ANESTHESIA N/A 07/27/2016   Procedure: RECTAL EXAM UNDER ANESTHESIA;  Surgeon: Robert Bellow, MD;  Location: [facility] ORS;  Service: General;  Laterality: N/A;  . TONSILLECTOMY    . TOOTH EXTRACTION    . TRANSANAL EXCISION OF RECTAL MASS  07/27/2016   Procedure: EXCISION OF RECTAL MASS;  Surgeon: Robert Bellow, MD;  Location: [facility] ORS;  Service: General;;  . WISDOM TOOTH EXTRACTION      Family History  Problem Relation Age of Onset  . Pancreatic cancer Mother   . Breast cancer Paternal Aunt     Social History Social History  Substance Use Topics  . Smoking status: Former Smoker    Packs/day: 0.50    Years: 4.00    Quit date: 01/26/1971  . Smokeless tobacco: Never Used  . Alcohol use Yes     Comment: occasionally with dinner    Allergies  Allergen Reactions  . Adhesive [Tape]     FOR  LONG PERIODS OF TIME  . Codeine Other (See  Comments)    Headaches Heart palpations  . Statins     Muscle pain, joint pain    Current Outpatient Prescriptions  Medication Sig Dispense Refill  . amLODipine-benazepril (LOTREL) 5-10 MG capsule TAKE 1 CAPSULE BY MOUTH DAILY FOR BLOOD PRESSURE (Patient taking differently: TAKE 1 CAPSULE BY MOUTH DAILY FOR BLOOD PRESSURE-AM) 30 capsule 12  . glipiZIDE (GLUCOTROL) 5 MG tablet TAKE 1 TABLET EVERY DAY FOR SUGAR CONTROL 90 tablet 3  . hydrochlorothiazide (MICROZIDE) 12.5 MG capsule TAKE ONE CAPSULE EVERYDAY FOR BLOOD PRESSURE/ FLUID 90 capsule 3  . HYDROcodone-acetaminophen (NORCO) 5-325 MG tablet Take 1-2 tablets by mouth every 4 (four) hours as needed for moderate pain. (Patient not taking: Reported on 08/18/2016) 20 tablet 0  . levothyroxine (SYNTHROID, LEVOTHROID) 100 MCG tablet TAKE 1 TABLET EVERY DAY ON AN EMPTY STOMACH WITH A GLASS OF WATER AT LEAST 30-60 MINUTES BEFORE BREAKFAST FOR THYROID 90 tablet 3  . metoprolol (TOPROL-XL) 200 MG 24 hr tablet TAKE 1 TABLET BY MOUTH DAILY FOR BLOOD PRESSURE (Patient taking differently: TAKE 1 TABLET BY MOUTH DAILY FOR BLOOD PRESSURE-AM) 30 tablet 12  . Multiple Vitamins-Minerals (CENTRUM SILVER ADULT 50+ PO) Take 1 tablet by mouth daily.    Marland Kitchen  pioglitazone (ACTOS) 45 MG tablet Take 1 tablet (45 mg total) by mouth daily. 30 tablet 3  . ezetimibe (ZETIA) 10 MG tablet Take 1 tablet (10 mg total) by mouth every morning. 30 tablet 12   No current facility-administered medications for this visit.     Review of Systems Review of Systems  Constitutional: Negative.   Respiratory: Negative.   Cardiovascular: Negative.   Gastrointestinal: Positive for anal bleeding and diarrhea.    Blood pressure (!) 108/56, pulse 82, resp. rate 12, height 5\' 6"  (1.676 m), weight 169 lb (76.7 kg).  Physical Exam Physical Exam  Constitutional: She is oriented to person, place, and time. She appears well-developed and well-nourished.  Cardiovascular: Normal rate, regular  rhythm and normal heart sounds.   Pulmonary/Chest: Effort normal and breath sounds normal.  Abdominal: Soft. Bowel sounds are normal. There is no tenderness.  Genitourinary:     Neurological: She is alert and oriented to person, place, and time.  Skin: Skin is warm and dry.    Data Reviewed 07/27/2016 pathology review: DIAGNOSIS:  A. MID DESCENDING COLON; BIOPSY:  - ADENOCARCINOMA.   B. RECTUM MASS, LOWER, 2:00; TRANSANAL EXCISION:  - INVASIVE ADENOCARCINOMA, MODERATELY DIFFERENTIATED.   Assessment    Marked improvement in perianal pain with the use of Desitin.  Mid descending colon adenocarcinoma.    Plan    Plans for laparoscopically assisted left hemicolectomy reviewed. Very low likelihood of need for a surgical stoma.  Importance of early ambulation after surgery reviewed.    Patient is scheduled for surgery on 08/21/2016. The patient is aware to call back for any questions or concerns.   HPI, Physical Exam, Assessment and Plan have been scribed under the direction and in the presence of Hervey Ard, MD.  Gaspar Cola, CMA  I have completed the exam and reviewed the above documentation for accuracy and completeness.  I agree with the above.  Haematologist has been used and any errors in dictation or transcription are unintentional.  Hervey Ard, M.D., F.A.C.S.  Robert Bellow 08/18/2016, 12:16 PM

## 2016-08-18 NOTE — Patient Instructions (Signed)
Patient is scheduled for surgery on 08/21/2016. The patient is aware to call back for any questions or concerns.

## 2016-08-18 NOTE — Progress Notes (Signed)
Tracy Silva  MRN: [id number] DOB: Jul 08, 1940  Subjective:  HPI   Patient is here for 6 months follow up. Last routine office visit was in January 2018. HTN: patient is checking her b/p and readings have been around 110/56. Patient denies chest pain, shortness of breath or dizziness. BP Readings from Last 3 Encounters:  08/18/16 (!) 98/54  08/18/16 (!) 108/56  08/07/16 136/62   Diabetes: patient is not checking her sugar at home. Denies sensation of numbness or tingling. Patient will get an eye exam soon-goes to [facility] in [place]. Lab Results  Component Value Date   HGBA1C 7.6 02/19/2016   Lab Results  Component Value Date   CHOL 319 (H) 10/11/2015   HDL 40 10/11/2015   LDLCALC Comment 10/11/2015   TRIG 484 (H) 10/11/2015   Last MetC and CBC were checked on 07/20/16 Routine lab work was done on 10/11/15.  Patient Active Problem List   Diagnosis Date Noted  . Rectal pain 08/05/2016  . Rectal cancer ([place]) 07/30/2016  . Cancer of descending colon ([place]) 07/30/2016  . Acid reflux 09/07/2014  . AI (aortic incompetence) 09/07/2014  . Allergic rhinitis 09/07/2014  . Clinical depression 09/07/2014  . Diabetes mellitus type 2 in obese ([place]) 09/07/2014  . Essential (primary) hypertension 09/07/2014  . H/O thyroid disease 09/07/2014  . HLD (hyperlipidemia) 09/07/2014  . Acquired hypothyroidism 09/07/2014  . Adiposity 09/07/2014  . Post menopausal syndrome 09/07/2014  . Basal cell papilloma 09/07/2014  . Avitaminosis D 09/07/2014    Past Medical History:  Diagnosis Date  . Acid reflux   . Anemia    H/O AS A CHILD  . Arthritis    FINGERS AND SHOULDER-RIGHT  . Asthma    AS A CHILD  . Diabetes mellitus without complication ([place])   . Heart murmur    ASYMPTOMATIC  . High cholesterol   . Hypertension   . Hypothyroidism   . Other and unspecified noninfectious gastroenteritis and colitis(558.9) 06/27/2012  . Rectal bleeding 2011  . Rectal cancer ([place]) 07/16/2016     Social History   Social History  . Marital status: Married    Spouse name: N/A  . Number of children: N/A  . Years of education: N/A   Occupational History  . Not on file.   Social History Main Topics  . Smoking status: Former Smoker    Packs/day: 0.50    Years: 4.00    Quit date: 01/26/1971  . Smokeless tobacco: Never Used  . Alcohol use Yes     Comment: occasionally with dinner  . Drug use: No  . Sexual activity: Not on file   Other Topics Concern  . Not on file   Social History Narrative  . No narrative on file    Outpatient Encounter Prescriptions as of 08/18/2016  Medication Sig  . amLODipine-benazepril (LOTREL) 5-10 MG capsule TAKE 1 CAPSULE BY MOUTH DAILY FOR BLOOD PRESSURE (Patient taking differently: TAKE 1 CAPSULE BY MOUTH DAILY FOR BLOOD PRESSURE-AM)  . glipiZIDE (GLUCOTROL) 5 MG tablet TAKE 1 TABLET EVERY DAY FOR SUGAR CONTROL  . hydrochlorothiazide (MICROZIDE) 12.5 MG capsule TAKE ONE CAPSULE EVERYDAY FOR BLOOD PRESSURE/ FLUID  . levothyroxine (SYNTHROID, LEVOTHROID) 100 MCG tablet TAKE 1 TABLET EVERY DAY ON AN EMPTY STOMACH WITH A GLASS OF WATER AT LEAST 30-60 MINUTES BEFORE BREAKFAST FOR THYROID  . metoprolol (TOPROL-XL) 200 MG 24 hr tablet TAKE 1 TABLET BY MOUTH DAILY FOR BLOOD PRESSURE (Patient taking differently: TAKE 1 TABLET BY MOUTH DAILY FOR  BLOOD PRESSURE-AM)  . Multiple Vitamins-Minerals (CENTRUM SILVER ADULT 50+ PO) Take 1 tablet by mouth daily.  . pioglitazone (ACTOS) 45 MG tablet Take 1 tablet (45 mg total) by mouth daily.  Marland Kitchen HYDROcodone-acetaminophen (NORCO) 5-325 MG tablet Take 1-2 tablets by mouth every 4 (four) hours as needed for moderate pain. (Patient not taking: Reported on 08/18/2016)  . [DISCONTINUED] loratadine (CLARITIN) 10 MG tablet Take 10 mg by mouth daily as needed for allergies.    No facility-administered encounter medications on file as of 08/18/2016.     Allergies  Allergen Reactions  . Adhesive [Tape]     FOR  LONG  PERIODS OF TIME  . Codeine Other (See Comments)    Headaches Heart palpations    Review of Systems  Constitutional: Negative.   Respiratory: Negative.   Cardiovascular: Negative.   Musculoskeletal: Negative.   Neurological: Negative.     Objective:  BP (!) 98/54   Pulse 82   Temp 97.9 F (36.6 C)   Resp 12   Wt 170 lb (77.1 kg)   BMI 27.44 kg/m   Physical Exam  Constitutional: She is oriented to person, place, and time and well-developed, well-nourished, and in no distress.  HENT:  Head: Normocephalic and atraumatic.  Eyes: Pupils are equal, round, and reactive to light. Conjunctivae are normal.  Neck: Normal range of motion. Neck supple.  Cardiovascular: Normal rate, regular rhythm, normal heart sounds and intact distal pulses.  Exam reveals no gallop.   No murmur heard. 2/6 systolic murmur.  Pulmonary/Chest: Effort normal and breath sounds normal. No respiratory distress. She has no wheezes.  Neurological: She is alert and oriented to person, place, and time.  Psychiatric: Mood, memory, affect and judgment normal.    Assessment and Plan :  1. Type 2 diabetes mellitus without complication, without long-term current use of insulin (HCC) A1C 7.1. Better. Continue current regimen. - POCT HgB A1C--7.1 today.  2. Essential hypertension On hypotensive side the past 2 office visits. Will follow for now and re check on the next visit. May need to make medication adjustment then. Patient is asymptomatic today.  3. Acquired hypothyroidism  4. Cancer of descending colon [facility]) Patient sees Dr Bary Castilla for check up on this. For takedown of ostomy soon. 5. Other hyperlipidemia Discussed importance of treating this. Patient agrees to try Zetia. Patient is statin intolerant. Follow up in 1 month and will get lab work done then. - ezetimibe (ZETIA) 10 MG tablet; Take 1 tablet (10 mg total) by mouth every morning.  Dispense: 30 tablet; Refill: 12  HPI, Exam and A&P transcribed by  Theressa Millard, RMA under direction and in the presence of Miguel Aschoff, MD. I have done the exam and reviewed the chart and it is accurate to the best of my knowledge. Development worker, community has been used and  any errors in dictation or transcription are unintentional. Miguel Aschoff M.D. [facility]

## 2016-08-20 ENCOUNTER — Encounter
Admission: RE | Admit: 2016-08-20 | Discharge: 2016-08-20 | Disposition: A | Discharge status: Home / Self Care | Payer: Medicare Other | Source: Ambulatory Visit | Attending: General Surgery | Admitting: General Surgery

## 2016-08-20 DIAGNOSIS — I498 Other specified cardiac arrhythmias: Secondary | ICD-10-CM | POA: Diagnosis not present

## 2016-08-20 DIAGNOSIS — C772 Secondary and unspecified malignant neoplasm of intra-abdominal lymph nodes: Secondary | ICD-10-CM | POA: Diagnosis not present

## 2016-08-20 DIAGNOSIS — C189 Malignant neoplasm of colon, unspecified: Secondary | ICD-10-CM | POA: Diagnosis not present

## 2016-08-20 DIAGNOSIS — Z87891 Personal history of nicotine dependence: Secondary | ICD-10-CM | POA: Diagnosis not present

## 2016-08-20 DIAGNOSIS — C186 Malignant neoplasm of descending colon: Secondary | ICD-10-CM | POA: Diagnosis not present

## 2016-08-20 DIAGNOSIS — E119 Type 2 diabetes mellitus without complications: Secondary | ICD-10-CM | POA: Diagnosis not present

## 2016-08-20 DIAGNOSIS — E039 Hypothyroidism, unspecified: Secondary | ICD-10-CM | POA: Diagnosis not present

## 2016-08-20 DIAGNOSIS — R011 Cardiac murmur, unspecified: Secondary | ICD-10-CM | POA: Diagnosis not present

## 2016-08-20 DIAGNOSIS — E785 Hyperlipidemia, unspecified: Secondary | ICD-10-CM | POA: Diagnosis not present

## 2016-08-20 DIAGNOSIS — I10 Essential (primary) hypertension: Secondary | ICD-10-CM | POA: Diagnosis not present

## 2016-08-20 DIAGNOSIS — K219 Gastro-esophageal reflux disease without esophagitis: Secondary | ICD-10-CM | POA: Diagnosis not present

## 2016-08-20 DIAGNOSIS — J45909 Unspecified asthma, uncomplicated: Secondary | ICD-10-CM | POA: Diagnosis not present

## 2016-08-20 DIAGNOSIS — Z7984 Long term (current) use of oral hypoglycemic drugs: Secondary | ICD-10-CM | POA: Diagnosis not present

## 2016-08-20 LAB — SURGICAL PCR SCREEN
MRSA, PCR: NEGATIVE
Staphylococcus aureus: NEGATIVE

## 2016-08-20 MED ORDER — SODIUM CHLORIDE 0.9 % IV SOLN
1.0000 g | INTRAVENOUS | Status: AC
  Administered 2016-08-21: 1 g via INTRAVENOUS
  Filled 2016-08-20: qty 1

## 2016-08-21 ENCOUNTER — Inpatient Hospital Stay: Payer: Medicare Other | Admitting: Certified Registered Nurse Anesthetist

## 2016-08-21 ENCOUNTER — Encounter: Payer: Self-pay | Admitting: *Deleted

## 2016-08-21 ENCOUNTER — Inpatient Hospital Stay
Admission: RE | Admit: 2016-08-21 | Discharge: 2016-08-25 | DRG: 331 | Disposition: A | Discharge status: Home Health | Payer: Medicare Other | Source: Ambulatory Visit | Attending: General Surgery | Admitting: General Surgery

## 2016-08-21 ENCOUNTER — Encounter
Admission: RE | Disposition: A | Discharge status: Home Health | Payer: Self-pay | Source: Ambulatory Visit | Attending: General Surgery

## 2016-08-21 DIAGNOSIS — Z87891 Personal history of nicotine dependence: Secondary | ICD-10-CM | POA: Diagnosis not present

## 2016-08-21 DIAGNOSIS — C186 Malignant neoplasm of descending colon: Principal | ICD-10-CM | POA: Diagnosis present

## 2016-08-21 DIAGNOSIS — C189 Malignant neoplasm of colon, unspecified: Secondary | ICD-10-CM | POA: Diagnosis present

## 2016-08-21 DIAGNOSIS — K219 Gastro-esophageal reflux disease without esophagitis: Secondary | ICD-10-CM | POA: Diagnosis present

## 2016-08-21 DIAGNOSIS — E039 Hypothyroidism, unspecified: Secondary | ICD-10-CM | POA: Diagnosis present

## 2016-08-21 DIAGNOSIS — I10 Essential (primary) hypertension: Secondary | ICD-10-CM | POA: Diagnosis present

## 2016-08-21 DIAGNOSIS — J45909 Unspecified asthma, uncomplicated: Secondary | ICD-10-CM | POA: Diagnosis present

## 2016-08-21 DIAGNOSIS — Z7984 Long term (current) use of oral hypoglycemic drugs: Secondary | ICD-10-CM

## 2016-08-21 DIAGNOSIS — E119 Type 2 diabetes mellitus without complications: Secondary | ICD-10-CM | POA: Diagnosis present

## 2016-08-21 DIAGNOSIS — E785 Hyperlipidemia, unspecified: Secondary | ICD-10-CM | POA: Diagnosis present

## 2016-08-21 HISTORY — PX: LAPAROSCOPIC PARTIAL COLECTOMY: SHX5907

## 2016-08-21 LAB — CREATININE, SERUM
CREATININE: 1.76 mg/dL — AB (ref 0.44–1.00)
GFR calc Af Amer: 31 mL/min — ABNORMAL LOW (ref >60)
GFR calc non Af Amer: 27 mL/min — ABNORMAL LOW (ref >60)

## 2016-08-21 LAB — GLUCOSE, CAPILLARY
GLUCOSE-CAPILLARY: 204 mg/dL — AB (ref 65–99)
Glucose-Capillary: 115 mg/dL — ABNORMAL HIGH (ref 65–99)
Glucose-Capillary: 213 mg/dL — ABNORMAL HIGH (ref 65–99)
Glucose-Capillary: 190 mg/dL — ABNORMAL HIGH (ref 65–99)

## 2016-08-21 SURGERY — LAPAROSCOPIC PARTIAL COLECTOMY
Anesthesia: General | Laterality: Left | Wound class: Clean Contaminated

## 2016-08-21 MED ORDER — FAMOTIDINE 20 MG PO TABS
ORAL_TABLET | ORAL | Status: AC
  Administered 2016-08-21: 20 mg via ORAL
  Filled 2016-08-21: qty 1

## 2016-08-21 MED ORDER — ALVIMOPAN 12 MG PO CAPS
12.0000 mg | ORAL_CAPSULE | ORAL | Status: AC
  Administered 2016-08-21: 12 mg via ORAL

## 2016-08-21 MED ORDER — BUPIVACAINE-EPINEPHRINE (PF) 0.5% -1:200000 IJ SOLN
INTRAMUSCULAR | Status: AC
  Filled 2016-08-21: qty 30

## 2016-08-21 MED ORDER — ONDANSETRON HCL 4 MG/2ML IJ SOLN: 4.0000 mg | Freq: Once | INTRAMUSCULAR | Status: DC

## 2016-08-21 MED ORDER — ONDANSETRON HCL 4 MG/2ML IJ SOLN
INTRAMUSCULAR | Status: AC
  Administered 2016-08-21: 17:00:00
  Filled 2016-08-21: qty 2

## 2016-08-21 MED ORDER — LIDOCAINE HCL (PF) 2 % IJ SOLN
INTRAMUSCULAR | Status: AC
  Filled 2016-08-21: qty 2

## 2016-08-21 MED ORDER — PROPOFOL 10 MG/ML IV BOLUS
INTRAVENOUS | Status: DC | PRN
  Administered 2016-08-21: 120 mg via INTRAVENOUS

## 2016-08-21 MED ORDER — AMLODIPINE BESYLATE 5 MG PO TABS
5.0000 mg | ORAL_TABLET | Freq: Every day | ORAL | Status: DC
  Administered 2016-08-23 – 2016-08-25 (×3): 5 mg via ORAL
  Filled 2016-08-21 (×3): qty 1

## 2016-08-21 MED ORDER — ALVIMOPAN 12 MG PO CAPS
12.0000 mg | ORAL_CAPSULE | Freq: Two times a day (BID) | ORAL | Status: DC
  Administered 2016-08-21 – 2016-08-22 (×3): 12 mg via ORAL
  Filled 2016-08-21 (×5): qty 1

## 2016-08-21 MED ORDER — ROCURONIUM BROMIDE 100 MG/10ML IV SOLN
INTRAVENOUS | Status: DC | PRN
  Administered 2016-08-21: 20 mg via INTRAVENOUS
  Administered 2016-08-21: 50 mg via INTRAVENOUS

## 2016-08-21 MED ORDER — ACETAMINOPHEN 325 MG PO TABS
650.0000 mg | ORAL_TABLET | ORAL | Status: DC
  Administered 2016-08-22 – 2016-08-24 (×8): 650 mg via ORAL
  Filled 2016-08-21 (×9): qty 2

## 2016-08-21 MED ORDER — ENOXAPARIN SODIUM 30 MG/0.3ML ~~LOC~~ SOLN
30.0000 mg | SUBCUTANEOUS | Status: DC
  Administered 2016-08-22 – 2016-08-24 (×3): 30 mg via SUBCUTANEOUS
  Filled 2016-08-21 (×3): qty 0.3

## 2016-08-21 MED ORDER — MORPHINE SULFATE (PF) 2 MG/ML IV SOLN: 2.0000 mg | INTRAVENOUS | Status: DC | PRN

## 2016-08-21 MED ORDER — BUPIVACAINE-EPINEPHRINE (PF) 0.5% -1:200000 IJ SOLN
INTRAMUSCULAR | Status: DC | PRN
  Administered 2016-08-21: 30 mL via PERINEURAL

## 2016-08-21 MED ORDER — KETOROLAC TROMETHAMINE 30 MG/ML IJ SOLN
INTRAMUSCULAR | Status: AC
  Filled 2016-08-21: qty 1

## 2016-08-21 MED ORDER — ACETAMINOPHEN 10 MG/ML IV SOLN
INTRAVENOUS | Status: DC | PRN
  Administered 2016-08-21: 1000 mg via INTRAVENOUS

## 2016-08-21 MED ORDER — PROPOFOL 10 MG/ML IV BOLUS
INTRAVENOUS | Status: AC
  Filled 2016-08-21: qty 20

## 2016-08-21 MED ORDER — SODIUM CHLORIDE 0.9 % IV SOLN
INTRAVENOUS | Status: DC
  Administered 2016-08-21 (×2): via INTRAVENOUS

## 2016-08-21 MED ORDER — SUGAMMADEX SODIUM 200 MG/2ML IV SOLN
INTRAVENOUS | Status: DC | PRN
  Administered 2016-08-21: 300 mg via INTRAVENOUS

## 2016-08-21 MED ORDER — INSULIN ASPART 100 UNIT/ML ~~LOC~~ SOLN
0.0000 [IU] | Freq: Three times a day (TID) | SUBCUTANEOUS | Status: DC
  Administered 2016-08-21: 5 [IU] via SUBCUTANEOUS
  Administered 2016-08-22 (×2): 2 [IU] via SUBCUTANEOUS
  Filled 2016-08-21 (×3): qty 1

## 2016-08-21 MED ORDER — BENAZEPRIL HCL 10 MG PO TABS
10.0000 mg | ORAL_TABLET | Freq: Every day | ORAL | Status: DC
  Administered 2016-08-23 – 2016-08-25 (×3): 10 mg via ORAL
  Filled 2016-08-21 (×5): qty 1

## 2016-08-21 MED ORDER — LEVOTHYROXINE SODIUM 100 MCG PO TABS
100.0000 ug | ORAL_TABLET | Freq: Every day | ORAL | Status: DC
  Administered 2016-08-22 – 2016-08-25 (×4): 100 ug via ORAL
  Filled 2016-08-21 (×4): qty 1

## 2016-08-21 MED ORDER — LACTATED RINGERS IV SOLN
INTRAVENOUS | Status: DC
  Administered 2016-08-21 – 2016-08-22 (×2): via INTRAVENOUS

## 2016-08-21 MED ORDER — OXYCODONE HCL 5 MG PO TABS: 5.0000 mg | ORAL_TABLET | Freq: Once | ORAL | Status: DC | PRN

## 2016-08-21 MED ORDER — OXYCODONE HCL 5 MG PO TABS
2.5000 mg | ORAL_TABLET | ORAL | Status: DC | PRN
  Administered 2016-08-21 – 2016-08-24 (×11): 2.5 mg via ORAL
  Filled 2016-08-21 (×11): qty 1

## 2016-08-21 MED ORDER — KETOROLAC TROMETHAMINE 30 MG/ML IJ SOLN
INTRAMUSCULAR | Status: DC | PRN
  Administered 2016-08-21: 15 mg via INTRAVENOUS

## 2016-08-21 MED ORDER — FENTANYL CITRATE (PF) 100 MCG/2ML IJ SOLN
INTRAMUSCULAR | Status: AC
  Administered 2016-08-21: 50 ug via INTRAVENOUS
  Filled 2016-08-21: qty 2

## 2016-08-21 MED ORDER — FENTANYL CITRATE (PF) 250 MCG/5ML IJ SOLN
INTRAMUSCULAR | Status: AC
  Filled 2016-08-21: qty 5

## 2016-08-21 MED ORDER — FAMOTIDINE 20 MG PO TABS
20.0000 mg | ORAL_TABLET | Freq: Once | ORAL | Status: AC
  Administered 2016-08-21: 20 mg via ORAL

## 2016-08-21 MED ORDER — ONDANSETRON HCL 4 MG/2ML IJ SOLN
INTRAMUSCULAR | Status: DC | PRN
  Administered 2016-08-21: 4 mg via INTRAVENOUS

## 2016-08-21 MED ORDER — HYDROCHLOROTHIAZIDE 12.5 MG PO CAPS
12.5000 mg | ORAL_CAPSULE | Freq: Every day | ORAL | Status: DC
  Administered 2016-08-23 – 2016-08-25 (×3): 12.5 mg via ORAL
  Filled 2016-08-21 (×3): qty 1

## 2016-08-21 MED ORDER — PHENYLEPHRINE HCL 10 MG/ML IJ SOLN
INTRAMUSCULAR | Status: DC | PRN
  Administered 2016-08-21 (×7): 100 ug via INTRAVENOUS
  Administered 2016-08-21: 200 ug via INTRAVENOUS
  Administered 2016-08-21: 100 ug via INTRAVENOUS

## 2016-08-21 MED ORDER — ALVIMOPAN 12 MG PO CAPS
ORAL_CAPSULE | ORAL | Status: AC
  Administered 2016-08-21: 12 mg via ORAL
  Filled 2016-08-21: qty 1

## 2016-08-21 MED ORDER — ACETAMINOPHEN 10 MG/ML IV SOLN
INTRAVENOUS | Status: AC
  Filled 2016-08-21: qty 100

## 2016-08-21 MED ORDER — OXYCODONE HCL 5 MG PO TABS: 5.0000 mg | ORAL_TABLET | ORAL | Status: DC | PRN

## 2016-08-21 MED ORDER — EZETIMIBE 10 MG PO TABS
10.0000 mg | ORAL_TABLET | Freq: Every morning | ORAL | Status: DC
Start: 2016-08-21 — End: 2016-08-25
  Administered 2016-08-22 – 2016-08-25 (×4): 10 mg via ORAL
  Filled 2016-08-21 (×5): qty 1

## 2016-08-21 MED ORDER — ROCURONIUM BROMIDE 50 MG/5ML IV SOLN
INTRAVENOUS | Status: AC
  Filled 2016-08-21: qty 1

## 2016-08-21 MED ORDER — SUGAMMADEX SODIUM 200 MG/2ML IV SOLN
INTRAVENOUS | Status: AC
  Filled 2016-08-21: qty 2

## 2016-08-21 MED ORDER — FENTANYL CITRATE (PF) 100 MCG/2ML IJ SOLN
INTRAMUSCULAR | Status: DC | PRN
  Administered 2016-08-21: 50 ug via INTRAVENOUS
  Administered 2016-08-21: 100 ug via INTRAVENOUS
  Administered 2016-08-21 (×2): 50 ug via INTRAVENOUS

## 2016-08-21 MED ORDER — EPHEDRINE SULFATE 50 MG/ML IJ SOLN
INTRAMUSCULAR | Status: DC | PRN
  Administered 2016-08-21 (×2): 10 mg via INTRAVENOUS
  Administered 2016-08-21: 5 mg via INTRAVENOUS

## 2016-08-21 MED ORDER — DEXAMETHASONE SODIUM PHOSPHATE 10 MG/ML IJ SOLN
INTRAMUSCULAR | Status: DC | PRN
  Administered 2016-08-21: 10 mg via INTRAVENOUS

## 2016-08-21 MED ORDER — LIDOCAINE HCL (CARDIAC) 20 MG/ML IV SOLN
INTRAVENOUS | Status: DC | PRN
  Administered 2016-08-21: 60 mg via INTRAVENOUS

## 2016-08-21 MED ORDER — FENTANYL CITRATE (PF) 100 MCG/2ML IJ SOLN
25.0000 ug | INTRAMUSCULAR | Status: DC | PRN
  Administered 2016-08-21 (×2): 50 ug via INTRAVENOUS

## 2016-08-21 MED ORDER — DEXAMETHASONE SODIUM PHOSPHATE 10 MG/ML IJ SOLN
INTRAMUSCULAR | Status: AC
  Filled 2016-08-21: qty 1

## 2016-08-21 MED ORDER — ONDANSETRON HCL 4 MG/2ML IJ SOLN
INTRAMUSCULAR | Status: AC
  Filled 2016-08-21: qty 2

## 2016-08-21 MED ORDER — SEVOFLURANE IN SOLN
RESPIRATORY_TRACT | Status: AC
  Filled 2016-08-21: qty 250

## 2016-08-21 MED ORDER — OXYCODONE HCL 5 MG/5ML PO SOLN: 5.0000 mg | Freq: Once | ORAL | Status: DC | PRN

## 2016-08-21 MED ORDER — METOPROLOL SUCCINATE ER 100 MG PO TB24
100.0000 mg | ORAL_TABLET | Freq: Every day | ORAL | Status: DC
  Administered 2016-08-23 – 2016-08-25 (×3): 100 mg via ORAL
  Filled 2016-08-21 (×3): qty 1

## 2016-08-21 MED ORDER — PROMETHAZINE HCL 25 MG/ML IJ SOLN: 6.2500 mg | INTRAMUSCULAR | Status: DC | PRN

## 2016-08-21 MED ORDER — ACETAMINOPHEN 10 MG/ML IV SOLN
1000.0000 mg | Freq: Four times a day (QID) | INTRAVENOUS | Status: AC
  Administered 2016-08-21 – 2016-08-22 (×4): 1000 mg via INTRAVENOUS
  Filled 2016-08-21 (×4): qty 100

## 2016-08-21 SURGICAL SUPPLY — 86 items
APPLIER CLIP ROT 10 11.4 M/L (STAPLE)
BAG COUNTER SPONGE EZ (MISCELLANEOUS) IMPLANT
BLADE SURG 10 STRL SS SAFETY (BLADE) ×6 IMPLANT
BLADE SURG 11 STRL SS SAFETY (MISCELLANEOUS) ×3 IMPLANT
BLADE SURG SZ20 CARB STEEL (BLADE) ×3 IMPLANT
CANISTER SUCT 1200ML W/VALVE (MISCELLANEOUS) ×3 IMPLANT
CANNULA DILATOR 10 W/SLV (CANNULA) ×2 IMPLANT
CANNULA DILATOR 10MM W/SLV (CANNULA) ×1
CATH TRAY 16F METER LATEX (MISCELLANEOUS) ×3 IMPLANT
CHLORAPREP W/TINT 26ML (MISCELLANEOUS) ×3 IMPLANT
CLEANER CAUTERY TIP 5X5 PAD (MISCELLANEOUS) ×1 IMPLANT
CLIP APPLIE ROT 10 11.4 M/L (STAPLE) IMPLANT
CLOSURE WOUND 1/2 X4 (GAUZE/BANDAGES/DRESSINGS) ×2
COUNTER SPONGE BAG EZ (MISCELLANEOUS)
COVER CLAMP SIL LG PBX B (MISCELLANEOUS) IMPLANT
DEVICE HAND ACCESS DEXTUS (MISCELLANEOUS) IMPLANT
DRAPE LAP W/FLUID (DRAPES) ×3 IMPLANT
DRAPE UNDER BUTTOCK W/FLU (DRAPES) ×3 IMPLANT
DRSG OPSITE POSTOP 4X10 (GAUZE/BANDAGES/DRESSINGS) IMPLANT
DRSG OPSITE POSTOP 4X8 (GAUZE/BANDAGES/DRESSINGS) ×3 IMPLANT
DRSG TEGADERM 2-3/8X2-3/4 SM (GAUZE/BANDAGES/DRESSINGS) ×12 IMPLANT
DRSG TEGADERM 4X4.75 (GAUZE/BANDAGES/DRESSINGS) ×3 IMPLANT
DRSG TELFA 3X8 NADH (GAUZE/BANDAGES/DRESSINGS) ×3 IMPLANT
DRSG TELFA 4X3 1S NADH ST (GAUZE/BANDAGES/DRESSINGS) ×3 IMPLANT
ELECT BLADE 6.5 EXT (BLADE) ×3 IMPLANT
ELECT CAUTERY BLADE 6.4 (BLADE) ×3 IMPLANT
ELECT REM PT RETURN 9FT ADLT (ELECTROSURGICAL) ×3
ELECTRODE REM PT RTRN 9FT ADLT (ELECTROSURGICAL) ×1 IMPLANT
FILTER LAP SMOKE EVAC STRL (MISCELLANEOUS) ×3 IMPLANT
GLOVE BIO SURGEON STRL SZ7 (GLOVE) ×12 IMPLANT
GLOVE BIO SURGEON STRL SZ7.5 (GLOVE) ×6 IMPLANT
GLOVE INDICATOR 8.0 STRL GRN (GLOVE) ×9 IMPLANT
GOWN STRL REUS W/ TWL LRG LVL3 (GOWN DISPOSABLE) ×6 IMPLANT
GOWN STRL REUS W/TWL LRG LVL3 (GOWN DISPOSABLE) ×12
HANDLE YANKAUER SUCT BULB TIP (MISCELLANEOUS) ×3 IMPLANT
HOLDER FOLEY CATH W/STRAP (MISCELLANEOUS) ×3 IMPLANT
IRRIGATION STRYKERFLOW (MISCELLANEOUS) IMPLANT
IRRIGATOR STRYKERFLOW (MISCELLANEOUS)
IV LACTATED RINGERS 1000ML (IV SOLUTION) IMPLANT
KIT PINK PAD W/HEAD ARE REST (MISCELLANEOUS) ×3
KIT PINK PAD W/HEAD ARM REST (MISCELLANEOUS) ×1 IMPLANT
KIT RM TURNOVER CYSTO AR (KITS) ×3 IMPLANT
LABEL OR SOLS (LABEL) ×3 IMPLANT
NDL INSUFF ACCESS 14 VERSASTEP (NEEDLE) ×3 IMPLANT
NEEDLE HYPO 22GX1.5 SAFETY (NEEDLE) ×3 IMPLANT
NS IRRIG 500ML POUR BTL (IV SOLUTION) ×3 IMPLANT
PACK COLON CLEAN CLOSURE (MISCELLANEOUS) ×3 IMPLANT
PACK LAP CHOLECYSTECTOMY (MISCELLANEOUS) ×3 IMPLANT
PAD CLEANER CAUTERY TIP 5X5 (MISCELLANEOUS) ×2
PAD PREP 24X41 OB/GYN DISP (PERSONAL CARE ITEMS) ×3 IMPLANT
PENCIL ELECTRO HAND CTR (MISCELLANEOUS) ×3 IMPLANT
PROT DEXTUS HAND ACCESS (MISCELLANEOUS)
RETAINER VISCERA MED (MISCELLANEOUS) IMPLANT
RETRACTOR FIXED LENGTH SML (MISCELLANEOUS) IMPLANT
RETRACTOR WND ALEXIS-O 25 LRG (MISCELLANEOUS) IMPLANT
RETRACTOR WOUND ALXS 18CM MED (MISCELLANEOUS) ×1 IMPLANT
RTRCTR WOUND ALEXIS O 18CM MED (MISCELLANEOUS) ×3
RTRCTR WOUND ALEXIS O 25CM LRG (MISCELLANEOUS)
SCISSORS METZENBAUM CVD 33 (INSTRUMENTS) ×3 IMPLANT
SEAL FOR SCOPE WARMER C3101 (MISCELLANEOUS) IMPLANT
SET YANKAUER POOLE SUCT (MISCELLANEOUS) ×3 IMPLANT
SHEARS HARMONIC ACE PLUS 36CM (ENDOMECHANICALS) ×3 IMPLANT
SPONGE LAP 18X18 5 PK (GAUZE/BANDAGES/DRESSINGS) ×6 IMPLANT
STRIP CLOSURE SKIN 1/2X4 (GAUZE/BANDAGES/DRESSINGS) ×4 IMPLANT
SUT MAXON 0 T 12 3 (SUTURE) ×9 IMPLANT
SUT PROLENE 0 CT 1 30 (SUTURE) ×3 IMPLANT
SUT SILK 2 0 (SUTURE) ×2
SUT SILK 2-0 30XBRD TIE 12 (SUTURE) ×1 IMPLANT
SUT SILK 3-0 (SUTURE) ×6 IMPLANT
SUT VIC AB 2-0 BRD 54 (SUTURE) ×3 IMPLANT
SUT VIC AB 2-0 CT1 (SUTURE) ×3 IMPLANT
SUT VIC AB 2-0 CT1 27 (SUTURE) ×2
SUT VIC AB 2-0 CT1 TAPERPNT 27 (SUTURE) ×1 IMPLANT
SUT VIC AB 3-0 54X BRD REEL (SUTURE) ×1 IMPLANT
SUT VIC AB 3-0 BRD 54 (SUTURE) ×2
SUT VIC AB 3-0 SH 27 (SUTURE) ×6
SUT VIC AB 3-0 SH 27X BRD (SUTURE) ×3 IMPLANT
SUT VIC AB 4-0 FS2 27 (SUTURE) ×6 IMPLANT
SYR BULB IRRIG 60ML STRL (SYRINGE) ×3 IMPLANT
SYR CONTROL 10ML (SYRINGE) ×3 IMPLANT
TAPE TRANSPORE STRL 2 31045 (GAUZE/BANDAGES/DRESSINGS) ×3 IMPLANT
TROCAR XCEL NON-BLD 11X100MML (ENDOMECHANICALS) ×3 IMPLANT
TROCAR XCEL NON-BLD 5MMX100MML (ENDOMECHANICALS) ×3 IMPLANT
TROCAR XCEL UNIV SLVE 11M 100M (ENDOMECHANICALS) ×3 IMPLANT
TUBING INSUF HEATED (TUBING) ×3 IMPLANT
WATER STERILE IRR 1000ML POUR (IV SOLUTION) ×3 IMPLANT

## 2016-08-21 NOTE — Anesthesia Procedure Notes (Signed)
Procedure Name: Intubation Date/Time: 08/21/2016 8:56 AM Performed by: Eben Burow Pre-anesthesia Checklist: Patient identified, Emergency Drugs available, Suction available, Patient being monitored and Timeout performed Patient Re-evaluated:Patient Re-evaluated prior to induction Oxygen Delivery Method: Circle system utilized Preoxygenation: Pre-oxygenation with 100% oxygen Induction Type: IV induction Ventilation: Mask ventilation without difficulty Laryngoscope Size: McGraph and 3 Grade View: Grade I Tube type: Oral Tube size: 7.0 mm Airway Equipment and Method: Stylet Placement Confirmation: ETT inserted through vocal cords under direct vision,  positive ETCO2 and breath sounds checked- equal and bilateral Secured at: 21 cm Tube secured with: Tape Dental Injury: Teeth and Oropharynx as per pre-operative assessment

## 2016-08-21 NOTE — H&P (Signed)
Tolerated prep well. For left colon resection.

## 2016-08-21 NOTE — Op Note (Signed)
Preoperative diagnosis: Carcinoma the mid descending colon.  Postoperative diagnosis: Same.  Operative procedure: Laparoscopically assisted left hemicolectomy with primary anastomosis.  Operating surgeon: Ollen Bowl, M.D.  Pensions consultant: Synthia Innocent. Jamal Collin, M.D.  Anesthesia: Gen. endotracheal. Marcaine 0.5% with 1:200,000 units of epinephrine, 30 mL local infiltration.   Estimated blood loss: 250 mL.  IV fluids: 1600 mL.  Urine output: 260 mL.  Clinical note: This 76 year old woman was identified with a mid-descending colon lesion on CT confirmed on colonoscopy to be adenocarcinoma. No evidence of metastatic disease. She underwent formal bowel prep, received Invanz prior to procedure none SCD stockings for DVT prevention.  Operative note: With the patient under adequate general endotracheal anesthesia and on a nonslip pad she was placed in dorsal lithotomy position and a Foley catheter was placed by the nurse. The abdomen was prepped with ChloraPrep and draped. External warming device used.  A varies needle was placed to trend umbilical incision. After assuring intra-abdominal location with the hanging drop test the abdomen was insufflated with CO2 a 10 mmHg pressure. This showed no evidence of injury from initial port placement. No evidence of peritoneal carcinomatosis. The liver was unremarkable. 2-11 mm XL ports were placed in the right lateral abdomen. Making use of the Harmonic scalpel and the 30 scope the omental attachments to the distal half of the transverse colon was taken down. The splenic flexure was freed and the white line of Toldt on the right divided down to the sigmoid colon making use of a 5 mm XL port in the left lower quadrant. The colon was rotated medially and good mobility to the midline was noted. The sigmoid colon was free of diverticular disease and it was elected to make an anastomosis between the left half of the transverse colon and the mid sigmoid colon.  The  abdomen was desufflated and a wet lap placed in the left gutter near the splenic flexure bed. The final rotation of the left colon into the wound was completed after placement of an [place] wound protector. The mesentery was scored and the distal line of resection chosen. A Glassman clamp was placed distally followed by a Coker clamp proximally and the bowel divided. The mesentery was data divided down to the retroperitoneum. This dissection was carried across including the left colic artery and vein which were controlled with a 2-0 silk tie 2. The transverse colon mesentery was divided and the proximal anastomotic site in the mid distal transverse colon was treated in similar fashion. It was elected to complete a end to end anastomosis. A row of posterior 3-0 silk seromuscular sutures were placed. Full-thickness 3-0 Vicryl locking suture completed for the posterior wall the anastomosis changing to a Connell suture anteriorly. A final row of 3-0 silk seromuscular sutures were placed to complete the anterior wall anastomosis. This palpated at 2 finger breaths.  The mesenteric defect was sizable and it was elected not to attempt closure. Inspection of the left upper quadrant showed good hemostasis. The sponge tape and instrument count correct.   Surgical gown and gloves and instruments were changed. The skin was cleansed with Betadine solution and local anesthetic infiltrated for postoperative comfort. The peritoneum was closed with a running 2-0 Vicryl locking suture. The fascia was closed with interrupted 0 Maxon figure-of-eight sutures. Wound was irrigated a final time. Adipose layer was approximated with a running 2-0 Vicryl suture. Skin was closed with a running 4-0 Vicryl subcuticular suture in 2 segments. Port site incisions were closed with 4-0 Vicryl septic  sutures. Benzoin and Steri-Strips, Telfa and Tegaderm dressings were applied to the port sites and a honeycomb dressing to the midline wound.  The  Foley catheter and oral gastric tube placed to the beginning of the procedure were removed. The patient was taken to recovery in stable condition.

## 2016-08-21 NOTE — Transfer of Care (Signed)
Immediate Anesthesia Transfer of Care Note  Patient: Tracy Silva  Procedure(s) Performed: Procedure(s): LAPAROSCOPIC ASSISTED LEFT HEMI-COLECTOMY (Left)  Patient Location: PACU  Anesthesia Type:General  Level of Consciousness: drowsy  Airway & Oxygen Therapy: Patient Spontanous Breathing and Patient connected to face mask oxygen  Post-op Assessment: Report given to RN and Post -op Vital signs reviewed and stable  Post vital signs: Reviewed and stable  Last Vitals:  Vitals:   08/21/16 0834 08/21/16 1255  BP: (!) 121/55 (!) 87/45  Pulse: 66 77  Resp: 16 18  Temp: (!) 35.9 C 36.8 C    Last Pain:  Vitals:   08/21/16 1255  TempSrc:   PainSc: Asleep      Patients Stated Pain Goal: 0 (54/27/06 2376)  Complications: No apparent anesthesia complications

## 2016-08-21 NOTE — Progress Notes (Signed)
Upon postop VS check, RN received a temperature of 93 orally. Axillary was taken and received 92.6. RN tried multiple machine and thermometers and received the same reading. Rectal temp was taken and received 95.5. Pt other VS are stable and is not expressing that she is cold and is not shivering. Dr. Bary Castilla was notified of findings. No new orders were given at this time, RN placed multiple warm blankets on pt. Will continue to monitor pt closely.  Larnce Schnackenberg [employer]

## 2016-08-21 NOTE — Progress Notes (Signed)
Anticoagulation monitoring(Lovenox):  76 yo female ordered Lovenox 40 mg Q24h  Filed Weights   08/21/16 0834  Weight: 170 lb (77.1 kg)   BMI    Lab Results  Component Value Date   CREATININE 1.76 (H) 08/21/2016   CREATININE 1.52 (H) 07/20/2016   CREATININE 1.60 (H) 10/11/2015   Estimated Creatinine Clearance: 27.6 mL/min (A) (by C-G formula based on SCr of 1.76 mg/dL (H)). Hemoglobin & Hematocrit     Component Value Date/Time   HGB 11.5 07/20/2016 1158   HCT 34.6 07/20/2016 1158     Per Protocol for Patient with estCrcl < 30 ml/min and BMI < 40, will transition to Lovenox 30 mg Q24h.

## 2016-08-21 NOTE — Anesthesia Post-op Follow-up Note (Cosign Needed)
Anesthesia QCDR form completed.        

## 2016-08-21 NOTE — Anesthesia Preprocedure Evaluation (Signed)
Anesthesia Evaluation  Patient identified by MRN, date of birth, ID band Patient awake    Reviewed: Allergy & Precautions, H&P , NPO status , Patient's Chart, lab work & pertinent test results  History of Anesthesia Complications Negative for: history of anesthetic complications  Airway Mallampati: III  TM Distance: <3 FB Neck ROM: limited    Dental  (+) Poor Dentition, Chipped   Pulmonary neg shortness of breath, asthma , former smoker,           Cardiovascular Exercise Tolerance: Good hypertension, (-) angina(-) Past MI and (-) DOE + dysrhythmias + Valvular Problems/Murmurs      Neuro/Psych PSYCHIATRIC DISORDERS Depression negative neurological ROS     GI/Hepatic Neg liver ROS, GERD  Medicated and Controlled,  Endo/Other  diabetes, Type 2Hypothyroidism   Renal/GU      Musculoskeletal  (+) Arthritis ,   Abdominal   Peds  Hematology negative hematology ROS (+)   Anesthesia Other Findings Past Medical History: No date: Acid reflux No date: Anemia     Comment:  H/O AS A CHILD No date: Arthritis     Comment:  FINGERS AND SHOULDER-RIGHT No date: Asthma     Comment:  AS A CHILD No date: Diabetes mellitus without complication (HCC) No date: Heart murmur     Comment:  ASYMPTOMATIC No date: High cholesterol No date: Hypertension No date: Hypothyroidism 06/27/2012: Other and unspecified noninfectious gastroenteritis and  colitis(558.9) 2011: Rectal bleeding 07/16/2016: Rectal cancer [facility])  Past Surgical History: 2011: COLONOSCOPY     Comment:  X2 07/27/2016: COLONOSCOPY; N/A     Comment:  Procedure: COLONOSCOPY IN O.R;  Surgeon: Robert Bellow, MD;  Location: [facility] ORS;  Service: General;                Laterality: N/A; No date: IUD REMOVAL 07/27/2016: RECTAL EXAM UNDER ANESTHESIA; N/A     Comment:  Procedure: RECTAL EXAM UNDER ANESTHESIA;  Surgeon:               Robert Bellow, MD;   Location: [facility] ORS;  Service:               General;  Laterality: N/A; No date: TONSILLECTOMY No date: TOOTH EXTRACTION 07/27/2016: TRANSANAL EXCISION OF RECTAL MASS     Comment:  Procedure: EXCISION OF RECTAL MASS;  Surgeon: Robert Bellow, MD;  Location: [facility] ORS;  Service: General;; No date: WISDOM TOOTH EXTRACTION     Reproductive/Obstetrics negative OB ROS                             Anesthesia Physical Anesthesia Plan  ASA: III  Anesthesia Plan: General ETT   Post-op Pain Management:    Induction: Intravenous  PONV Risk Score and Plan: 3 and Ondansetron, Dexamethasone, Propofol and Treatment may vary due to age or medical condition  Airway Management Planned: Oral ETT  Additional Equipment:   Intra-op Plan:   Post-operative Plan: Extubation in OR  Informed Consent: I have reviewed the patients History and Physical, chart, labs and discussed the procedure including the risks, benefits and alternatives for the proposed anesthesia with the patient or authorized representative who has indicated his/her understanding and acceptance.   Dental Advisory Given  Plan Discussed with: Anesthesiologist, CRNA and Surgeon  Anesthesia Plan  Comments: (Patient consented for risks of anesthesia including but not limited to:  - adverse reactions to medications - damage to teeth, lips or other oral mucosa - sore throat or hoarseness - Damage to heart, brain, lungs or loss of life  Patient voiced understanding.)        Anesthesia Quick Evaluation

## 2016-08-22 ENCOUNTER — Encounter: Payer: Self-pay | Admitting: General Surgery

## 2016-08-22 LAB — GLUCOSE, CAPILLARY
GLUCOSE-CAPILLARY: 137 mg/dL — AB (ref 65–99)
GLUCOSE-CAPILLARY: 101 mg/dL — AB (ref 65–99)
GLUCOSE-CAPILLARY: 111 mg/dL — AB (ref 65–99)
Glucose-Capillary: 127 mg/dL — ABNORMAL HIGH (ref 65–99)

## 2016-08-22 NOTE — Progress Notes (Signed)
Patient alert and oriented, vital signs stable, PO pain med administered with improvement.  LR infusing with no difficulty.  Up to bathroom to void and ambulated in hallway this afternoon.  Patient in no apparent distress with family at bedside.

## 2016-08-22 NOTE — Progress Notes (Signed)
RN notified dr Jamal Collin that night RN was concern of patient's blood pressure being too low with 4 htn meds ordered, current blood pressure is 101/37 with HR 72.   MD states "just hold all her blood pressure medicines for today"

## 2016-08-22 NOTE — Progress Notes (Signed)
Patient ID: Tracy Silva, female   DOB: 02-23-1940, 76 y.o.   MRN: [id number] No complaints. Pain is mild to moderate, pt doing well with 2.5 mg of oxycodone. Has been up and walked. No n/v. No flatus yet. AVSS. Abdomen is soft, active bowel sounds. Incision is clean and intact Lungs clear Good u/o.BS 138 this am. Doing well.

## 2016-08-23 LAB — GLUCOSE, CAPILLARY: GLUCOSE-CAPILLARY: 109 mg/dL — AB (ref 65–99)

## 2016-08-23 MED ORDER — SODIUM CHLORIDE 0.9 % IV SOLN: 250.0000 mL | INTRAVENOUS | Status: DC | PRN

## 2016-08-23 MED ORDER — SODIUM CHLORIDE 0.9% FLUSH: 3.0000 mL | INTRAVENOUS | Status: DC | PRN

## 2016-08-23 MED ORDER — SODIUM CHLORIDE 0.9% FLUSH
3.0000 mL | Freq: Two times a day (BID) | INTRAVENOUS | Status: DC
  Administered 2016-08-23 – 2016-08-24 (×2): 3 mL via INTRAVENOUS

## 2016-08-23 NOTE — Progress Notes (Signed)
AVSS. Tolerating diet well. Hungry. + BM. Pain well controlled on low dose oxycodone. Concerned about need for pain meds. Re-assured.  Creatinine up so NSAIA not appropriate.  No nausea. Lungs: Clear. Inspirex at 740. Cardio: RR. ABD: Scant distension, BS+, soft. Wounds: Clean and dry. Calves: Soft. Ambulating. BS: Excellent. IMP: Doing well. Plan:  Advance diet, D/C FSBS Change IV to SL. Tentative d/c tomorrow.

## 2016-08-24 LAB — BASIC METABOLIC PANEL
ANION GAP: 6 (ref 5–15)
BUN: 18 mg/dL (ref 6–20)
CHLORIDE: 107 mmol/L (ref 101–111)
CO2: 26 mmol/L (ref 22–32)
Calcium: 9 mg/dL (ref 8.9–10.3)
Creatinine, Ser: 1.44 mg/dL — ABNORMAL HIGH (ref 0.44–1.00)
GFR calc Af Amer: 39 mL/min — ABNORMAL LOW (ref >60)
GFR calc non Af Amer: 34 mL/min — ABNORMAL LOW (ref >60)
Glucose, Bld: 113 mg/dL — ABNORMAL HIGH (ref 65–99)
POTASSIUM: 4.1 mmol/L (ref 3.5–5.1)
SODIUM: 139 mmol/L (ref 135–145)

## 2016-08-24 LAB — CBC WITH DIFFERENTIAL/PLATELET
Basophils Absolute: 0.1 10*3/uL (ref 0–0.1)
Basophils Relative: 1 %
Eosinophils Absolute: 0.3 10*3/uL (ref 0–0.7)
Eosinophils Relative: 5 %
HCT: 26.2 % — ABNORMAL LOW (ref 35.0–47.0)
HEMOGLOBIN: 8.8 g/dL — AB (ref 12.0–16.0)
LYMPHS ABS: 1.3 10*3/uL (ref 1.0–3.6)
LYMPHS PCT: 20 %
MCH: 32.8 pg (ref 26.0–34.0)
MCHC: 33.5 g/dL (ref 32.0–36.0)
MCV: 97.8 fL (ref 80.0–100.0)
Monocytes Absolute: 0.6 10*3/uL (ref 0.2–0.9)
Monocytes Relative: 9 %
NEUTROS PCT: 65 %
Neutro Abs: 4.2 10*3/uL (ref 1.4–6.5)
Platelets: 225 10*3/uL (ref 150–440)
RBC: 2.68 MIL/uL — AB (ref 3.80–5.20)
RDW: 14.1 % (ref 11.5–14.5)
WBC: 6.6 10*3/uL (ref 3.6–11.0)

## 2016-08-24 MED ORDER — ENOXAPARIN SODIUM 40 MG/0.4ML ~~LOC~~ SOLN
40.0000 mg | SUBCUTANEOUS | Status: DC
  Administered 2016-08-25: 40 mg via SUBCUTANEOUS
  Filled 2016-08-24: qty 0.4

## 2016-08-24 MED ORDER — HYDROCODONE-ACETAMINOPHEN 7.5-325 MG PO TABS
1.0000 | ORAL_TABLET | ORAL | Status: DC | PRN
  Administered 2016-08-24 – 2016-08-25 (×4): 1 via ORAL
  Filled 2016-08-24 (×4): qty 1

## 2016-08-24 NOTE — Plan of Care (Signed)
Problem: Activity: Goal: Ability to return to normal activity level will improve Outcome: Progressing Pt was able tolerate walking around nurses station twice today.

## 2016-08-24 NOTE — Progress Notes (Signed)
AVSS. Tolerated diet advance well.   No DOE, SOB, lightheadedness.  Concerned about ability to get out of bed without assistance. Lungs: Clear. Inspirex: Unchanged at 750. Cardio: RR. ABD: Soft, good BS, non-tender. No distension. Extrem: Soft. Labs reviewed: HGB down from 11.5 preop to 8.8.  On Lovenox. WBC: Normal. Platelets: Normal. Renal function at baseline. Artificially depressed eGFR estimate from labs drawn afternoon of surgery rather than baseline one month earlier.  No clinical evidence of bleeding. Will work with mobility today and plan for d/c home in AM.

## 2016-08-24 NOTE — Progress Notes (Signed)
Anticoagulation monitoring(Lovenox):  76 yo  female ordered Lovenox 40 mg Q24h originally on current dose of 30 mg q24 due to renal function  Filed Weights   08/21/16 0834  Weight: 170 lb (77.1 kg)   Body mass index is 27.44 kg/m.   Lab Results  Component Value Date   CREATININE 1.44 (H) 08/24/2016   CREATININE 1.76 (H) 08/21/2016   CREATININE 1.52 (H) 07/20/2016   Estimated Creatinine Clearance: 33.8 mL/min (A) (by C-G formula based on SCr of 1.44 mg/dL (H)). Hemoglobin & Hematocrit     Component Value Date/Time   HGB 8.8 (L) 08/24/2016 0419   HGB 11.5 07/20/2016 1158   HCT 26.2 (L) 08/24/2016 0419   HCT 34.6 07/20/2016 1158     Per Protocol for Patient with estCrcl > 30 ml/min and BMI < 40, will transition back to Lovenox 40 mg Q24h.

## 2016-08-24 NOTE — Anesthesia Postprocedure Evaluation (Signed)
Anesthesia Post Note  Patient: Tracy Silva  Procedure(s) Performed: Procedure(s) (LRB): LAPAROSCOPIC ASSISTED LEFT HEMI-COLECTOMY (Left)  Patient location during evaluation: PACU Anesthesia Type: General Level of consciousness: awake and alert and oriented Pain management: pain level controlled Vital Signs Assessment: post-procedure vital signs reviewed and stable Respiratory status: spontaneous breathing Cardiovascular status: blood pressure returned to baseline Anesthetic complications: no     Last Vitals:  Vitals:   08/23/16 1954 08/24/16 0417  BP: (!) 129/49 138/65  Pulse: 84 83  Resp: 18 18  Temp: 36.7 C 36.8 C    Last Pain:  Vitals:   08/24/16 0746  TempSrc:   PainSc: 5                  Ginamarie Banfield

## 2016-08-24 NOTE — Evaluation (Signed)
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  Clinical Impression  Upon evaluation, patient alert and oriented; follows all commands and demonstrates excellent effort/participation with all therapeutic tasks.  Able to complete bed mobility with close sup; sit/stand, basic transfers and gait (20-240') with Rw, cga/close sup.  Generally slow and guarded with all transitional movements and mobility, but no overt buckling or LOB noted.  10' walk time, 18 seconds; significant below age-matched norms and indicative of increased fall risk. Optimal performance, comfort and confidence with use of RW; do recommend continued use with all mobility efforts at this time. Would benefit from skilled PT to address above deficits and promote optimal return to PLOF; Recommend transition to [place] upon discharge from acute hospitalization.     Follow Up Recommendations Home health PT    Equipment Recommendations  Rolling walker with 5" wheels    Recommendations for Other Services       Precautions / Restrictions Precautions Precautions: Fall Restrictions Weight Bearing Restrictions: No      Mobility  Bed Mobility Overal bed mobility: Needs Assistance Bed Mobility: Supine to Sit     Supine to sit: Supervision     General bed mobility comments: use of elevated HOB and bedrails as needed (has bed that will elevate in home environment)  Transfers Overall transfer level: Needs assistance Equipment used: Rolling walker (2 wheeled) Transfers: Sit to/from Stand Sit to Stand: Min guard;Supervision            Ambulation/Gait Ambulation/Gait assistance: Museum/gallery curator (Feet): 20 Feet Assistive device: 1 person hand held assist     Gait  velocity interpretation: <1.8 ft/sec, indicative of risk for recurrent falls General Gait Details: short, shuffling steps with poor trunk rotation, arm swing; guarded with limited balance reactions evident (often reaching for walls/furniture for support)  Stairs            Wheelchair Mobility    Modified Rankin (Stroke Patients Only)       Balance Overall balance assessment: Needs assistance Sitting-balance support: No upper extremity supported;Feet supported Sitting balance-Leahy Scale: Good     Standing balance support: No upper extremity supported Standing balance-Leahy Scale: Poor                               Pertinent Vitals/Pain Pain Assessment: Faces Faces Pain Scale: Hurts little more Pain Location: abdomen Pain Descriptors / Indicators: Aching;Grimacing;Operative site guarding    Home Living Family/patient expects to be discharged to:: Private residence Living Arrangements: Spouse/significant other Available Help at Discharge: Family Type of Home: House Home Access: Stairs to enter Entrance Stairs-Rails: None Entrance Stairs-Number of Steps: 1 Home Layout: One level Home Equipment: None      Prior Function Level of Independence: Independent         Comments: Indep with ADLs, household and community mobility; + driving; denies fall history.     Hand Dominance        Extremity/Trunk Assessment   Upper Extremity Assessment Upper Extremity Assessment: Overall WFL for tasks assessed    Lower Extremity Assessment Lower Extremity Assessment: Overall WFL for tasks assessed (grossly at least 4/5 throughout bilat LEs)       Communication   Communication: No difficulties  Cognition Arousal/Alertness: Awake/alert Behavior During Therapy:  WFL for tasks assessed/performed Overall Cognitive Status: Within Functional Limits for tasks assessed                                        General Comments      Exercises  Other Exercises Other Exercises: 38' with RW, cga/close sup-improved step height/length, improved cadence and overall gait speed (though still markedly slow compared to norms).  Min cuing for postural extension as tolerated and increasing cadence as able. Other Exercises: 10' walk time, 18 seconds-indicative of decreased stability, increased fall risk with activity   Assessment/Plan    PT Assessment Patient needs continued PT services  PT Problem List Decreased strength;Decreased range of motion;Decreased activity tolerance;Decreased balance;Decreased mobility;Decreased coordination;Decreased cognition;Decreased knowledge of use of DME;Decreased knowledge of precautions;Decreased safety awareness;Cardiopulmonary status limiting activity;Pain;Decreased skin integrity       PT Treatment Interventions DME instruction;Gait training;Stair training;Functional mobility training;Therapeutic activities;Therapeutic exercise;Balance training;Patient/family education    PT Goals (Current goals can be found in the Care Plan section)  Acute Rehab PT Goals Patient Stated Goal: to return home tomorrow PT Goal Formulation: With patient Time For Goal Achievement: 09/07/16 Potential to Achieve Goals: Good    Frequency Min 2X/week   Barriers to discharge Decreased caregiver support      Co-evaluation               AM-PAC PT "6 Clicks" Daily Activity  Outcome Measure Difficulty turning over in bed (including adjusting bedclothes, sheets and blankets)?: A Little Difficulty moving from lying on back to sitting on the side of the bed? : A Little Difficulty sitting down on and standing up from a chair with arms (e.g., wheelchair, bedside commode, etc,.)?: Total Help needed moving to and from a bed to chair (including a wheelchair)?: A Little Help needed walking in hospital room?: A Little Help needed climbing 3-5 steps with a railing? : A Little 6 Click Score: 16    End of Session Equipment  Utilized During Treatment: Gait belt Activity Tolerance: Patient tolerated treatment well Patient left: in chair;with call bell/phone within reach;with chair alarm set Nurse Communication: Mobility status PT Visit Diagnosis: Difficulty in walking, not elsewhere classified (R26.2);Muscle weakness (generalized) (M62.81)    Time: 4496-7591 PT Time Calculation (min) (ACUTE ONLY): 29 min   Charges:   PT Evaluation $PT Eval Low Complexity: 1 Low PT Treatments $Gait Training: 8-22 mins   PT G Codes:   PT G-Codes **NOT FOR INPATIENT CLASS** Functional Assessment Tool Used: AM-PAC 6 Clicks Basic Mobility Functional Limitation: Mobility: Walking and moving around Mobility: Walking and Moving Around Current Status (M3846): At least 20 percent but less than 40 percent impaired, limited or restricted Mobility: Walking and Moving Around Goal Status [phone]): At least 1 percent but less than 20 percent impaired, limited or restricted    Caroline Longie H. Owens Shark, PT, DPT, NCS 08/24/16, 1:58 PM [phone]

## 2016-08-24 NOTE — Care Management Important Message (Signed)
Important Message  Patient Details  Name: Tracy Silva MRN: [id number] Date of Birth: [birth year]   Medicare Important Message Given:  Yes    Beverly Sessions, RN 08/24/2016, 4:08 PM

## 2016-08-25 LAB — CEA: CEA: 1 ng/mL (ref 0.0–4.7)

## 2016-08-25 LAB — HEMOGLOBIN AND HEMATOCRIT, BLOOD
HEMATOCRIT: 27 % — AB (ref 35.0–47.0)
Hemoglobin: 9.1 g/dL — ABNORMAL LOW (ref 12.0–16.0)

## 2016-08-25 LAB — POCT GLYCOSYLATED HEMOGLOBIN (HGB A1C): Hemoglobin A1C: 7.1

## 2016-08-25 MED ORDER — ENOXAPARIN SODIUM 40 MG/0.4ML ~~LOC~~ SOLN: 40.0000 mg | SUBCUTANEOUS | 0 refills | Status: DC

## 2016-08-25 MED ORDER — METOPROLOL SUCCINATE ER 100 MG PO TB24: 100.0000 mg | ORAL_TABLET | Freq: Every day | ORAL | 0 refills | Status: DC

## 2016-08-25 MED ORDER — HYDROCODONE-ACETAMINOPHEN 7.5-325 MG PO TABS: 1.0000 | ORAL_TABLET | ORAL | 0 refills | Status: DC | PRN

## 2016-08-25 NOTE — Progress Notes (Signed)
Patient discharged via w/c home with husband. Lovenox administration explained and reviewed with patient. Prescription for hydrocodone given to patient. Metoprolol changes reviewed. Pharmacy location confirmed for efax prescriptions. Patient with no questions. IV removed without complications. Follow up appointments confirmed with patient.

## 2016-08-25 NOTE — Care Management (Signed)
Patient admitted s/p hemicolectomy. Patient lives at home with husband.  PCP Rosanna Randy.  Pharmacy [facility].  Patient to discharge on Lovenox.  Out of pocket cost $83.88, for Lovenox, patient denies issues obtaining medication.  PT has assessed patient and recommends home health PT.  Patient agrees to services.  Patient provided home health agency preference.  Patient states that she does not have an agency preference.  Referral made to [facility] [place]. RW and BSC were delivered prior to discharge. RNCM signing off  Notified after discharge that patient would have a $40 copay per visit for PT.

## 2016-08-25 NOTE — Progress Notes (Signed)
PT Cancellation Note  Patient Details Name: Tracy Silva MRN: [id number] DOB: 1940/04/22   Cancelled Treatment:    Reason Eval/Treat Not Completed: Other (comment)   Offered session this am.  Pt stated she had just completed 2 laps with nursing and was fatigued.  Will attempt again later this pm if pt is not discharged.   Chesley Noon 08/25/2016, 10:38 AM

## 2016-08-27 ENCOUNTER — Telehealth: Payer: Self-pay | Admitting: *Deleted

## 2016-08-27 ENCOUNTER — Other Ambulatory Visit: Payer: Self-pay | Admitting: *Deleted

## 2016-08-27 DIAGNOSIS — C186 Malignant neoplasm of descending colon: Secondary | ICD-10-CM

## 2016-08-27 LAB — SURGICAL PATHOLOGY

## 2016-08-27 NOTE — Telephone Encounter (Signed)
Tia at the [facility] called back and spoke with Raquel Sarna.   Tia had scheduled the patient to see Dr. Tasia Catchings for 08-31-16 at 8:30 am.  Dr. Bary Castilla was informed and wishes that we cancel appointment with Dr. Tasia Catchings.   Referral to [facility] will be arranged instead.

## 2016-08-27 NOTE — Telephone Encounter (Signed)
Tia at the [facility] is aware that we have placed a referral to Dr. Mike Gip for this patient. She states that Dr. Mike Gip is not taking new patients at this time per [facility]. Tia will check on this and call our office back.   Patient is scheduled to follow up with Dr. Bary Castilla on Tuesday, 09-01-16.

## 2016-08-27 NOTE — Telephone Encounter (Signed)
-----   Message from Robert Bellow, MD sent at 08/27/2016  3:12 PM EDT ----- Please schedule an appointment w/ Nolon Stalls re: colon cancer, node positive.  I have spoken to her about the patient. Ms. Reily is aware referral is in process.  She can be notified at time of Tuesday, Aug 7 appt.

## 2016-08-28 DIAGNOSIS — Z483 Aftercare following surgery for neoplasm: Secondary | ICD-10-CM | POA: Diagnosis not present

## 2016-08-28 DIAGNOSIS — I447 Left bundle-branch block, unspecified: Secondary | ICD-10-CM | POA: Diagnosis not present

## 2016-08-28 DIAGNOSIS — E119 Type 2 diabetes mellitus without complications: Secondary | ICD-10-CM | POA: Diagnosis not present

## 2016-08-28 DIAGNOSIS — C189 Malignant neoplasm of colon, unspecified: Secondary | ICD-10-CM | POA: Diagnosis not present

## 2016-08-28 DIAGNOSIS — I10 Essential (primary) hypertension: Secondary | ICD-10-CM | POA: Diagnosis not present

## 2016-08-29 NOTE — Discharge Summary (Signed)
Physician Discharge Summary  Patient ID: Tracy Silva MRN: [id number] DOB/AGE: 29-Jun-1940 76 y.o.  Admit date: 08/21/2016 Discharge date: 08/29/2016  Admission Diagnoses: Cancer of descending colon  Discharge Diagnoses:  Active Problems:   Colon cancer [facility])   Discharged Condition: good  Hospital Course: Lap assisted left colectomy.  Started on liquids afternoon of surgery. Ambulated early and often. Inspirex for pulmonary toilet. Steady progress with diet advance. Some issues regarding transfer out of bed necessitated PT evaluation.   Consults: Physical therapy.   Significant Diagnostic Studies: labs:    Treatments: IV hydration  Discharge Exam: Blood pressure (!) 158/66, pulse 93, temperature 99.2 F (37.3 C), temperature source Oral, resp. rate 20, height _0  (1.676 m), weight 170 lb (77.1 kg), SpO2 96 %. General appearance: alert and cooperative Resp: clear to auscultation bilaterally Cardio: regular rate and rhythm, S1, S2 normal, no murmur, click, rub or gallop GI: soft, non-tender; bowel sounds normal; no masses,  no organomegaly Incision/Wound: Wounds healing well.  Pathology: T3,N1, M0 carcinoma, no MSI instability.   Disposition: 01-Home or Self Care  Discharge Instructions    Diet - low sodium heart healthy    Complete by:  As directed    Discharge instructions    Complete by:  As directed    OK to shower.  No driving until pain free.  Diet as tolerated.  Note: Lower dose of metoprolol.  Do not restart glucotrol until instructed.  No lifting over 10 pounds.  Use spirometer at home.  Walk frequently.   No lifting over 10 pounds.   Increase activity slowly    Complete by:  As directed      Allergies as of 08/25/2016      Reactions   Adhesive [tape]    FOR  LONG PERIODS OF TIME   Codeine Other (See Comments)   Headaches Heart palpations   Statins    Muscle pain, joint pain      Medication List    STOP taking these medications    glipiZIDE 5 MG tablet Commonly known as:  GLUCOTROL   HYDROcodone-acetaminophen 5-325 MG tablet Commonly known as:  NORCO Replaced by:  HYDROcodone-acetaminophen 7.5-325 MG tablet     TAKE these medications   amLODipine-benazepril 5-10 MG capsule Commonly known as:  LOTREL TAKE 1 CAPSULE BY MOUTH DAILY FOR BLOOD PRESSURE What changed:  See the new instructions.   CENTRUM SILVER ADULT 50+ PO Take 1 tablet by mouth daily.   enoxaparin 40 MG/0.4ML injection Commonly known as:  LOVENOX Inject 0.4 mLs (40 mg total) into the skin daily.   ezetimibe 10 MG tablet Commonly known as:  ZETIA Take 1 tablet (10 mg total) by mouth every morning.   hydrochlorothiazide 12.5 MG capsule Commonly known as:  MICROZIDE TAKE ONE CAPSULE EVERYDAY FOR BLOOD PRESSURE/ FLUID   HYDROcodone-acetaminophen 7.5-325 MG tablet Commonly known as:  NORCO Take 1 tablet by mouth every 4 (four) hours as needed for moderate pain. Replaces:  HYDROcodone-acetaminophen 5-325 MG tablet Notes to patient:  Last dose given today at 3am   levothyroxine 100 MCG tablet Commonly known as:  SYNTHROID, LEVOTHROID TAKE 1 TABLET EVERY DAY ON AN EMPTY STOMACH WITH A GLASS OF WATER AT LEAST 30-60 MINUTES BEFORE BREAKFAST FOR THYROID   metoprolol succinate 100 MG 24 hr tablet Commonly known as:  TOPROL-XL Take 1 tablet (100 mg total) by mouth daily. Take with or immediately following a meal. What changed:  medication strength  See the new instructions.  pioglitazone 45 MG tablet Commonly known as:  ACTOS Take 1 tablet (45 mg total) by mouth daily.      Follow-up Information    Reginaldo Hazard, Forest Gleason, MD. Go on 09/01/2016.   Specialties:  General Surgery, Radiology Why:  _0 :30pm  Contact information: [address] [place] [address] [phone]           Signed: Robert Bellow 08/29/2016, 7:22 PM

## 2016-08-31 ENCOUNTER — Ambulatory Visit: Payer: Medicare Other | Admitting: Oncology

## 2016-08-31 NOTE — Telephone Encounter (Signed)
Per Dr. Bary Castilla, Freida Busman approved patient to see Dr. Mike Gip.   An appointment has been scheduled for the patient to see her on 09-03-16 at 9:15 am (arrive 9 am).   Caryl-Lyn to inform patient of details at her office visit with Dr. Bary Castilla which is scheduled for tomorrow.

## 2016-09-01 ENCOUNTER — Encounter: Payer: Self-pay | Admitting: General Surgery

## 2016-09-01 ENCOUNTER — Ambulatory Visit (INDEPENDENT_AMBULATORY_CARE_PROVIDER_SITE_OTHER): Payer: Medicare Other | Admitting: General Surgery

## 2016-09-01 VITALS — BP 142/80 | HR 83 | Resp 14 | Ht 62.0 in | Wt 164.0 lb

## 2016-09-01 DIAGNOSIS — R739 Hyperglycemia, unspecified: Secondary | ICD-10-CM | POA: Diagnosis not present

## 2016-09-01 DIAGNOSIS — C186 Malignant neoplasm of descending colon: Secondary | ICD-10-CM

## 2016-09-01 LAB — GLUCOSE, POCT (MANUAL RESULT ENTRY): POC Glucose: 282 mg/dl — AB (ref 70–99)

## 2016-09-01 NOTE — Patient Instructions (Addendum)
Patient to start back on her glipizide. Patient to return in two weeks. The patient is aware to call back for any questions or concerns.

## 2016-09-01 NOTE — Telephone Encounter (Signed)
Patient notified of appointment time and date for Dr Mike Gip.

## 2016-09-01 NOTE — Progress Notes (Signed)
Patient ID: Tracy Silva, female   DOB: 05/10/1940, 76 y.o.   MRN: [id number]  Chief Complaint  Patient presents with  . Routine Post Op    colectomy    HPI Tracy Silva is a 76 y.o. female here today for her post op colectomy done on 08/21/2016. Patient states she is doing well. Patient has an appointment  with Dr. Mike Gip on 09/03/2016.Patient stop her enoxaparin  shots on 08/23/2016 because she was having nose bleeds. Daughter, Tracy Silva is present at visit.  HPI  Past Medical History:  Diagnosis Date  . Acid reflux   . Anemia    H/O AS A CHILD  . Arthritis    FINGERS AND SHOULDER-RIGHT  . Asthma    AS A CHILD  . Diabetes mellitus without complication ([place])   . Heart murmur    ASYMPTOMATIC  . High cholesterol   . Hypertension   . Hypothyroidism   . Other and unspecified noninfectious gastroenteritis and colitis(558.9) 06/27/2012  . Rectal bleeding 2011  . Rectal cancer ([place]) 07/16/2016    Past Surgical History:  Procedure Laterality Date  . COLONOSCOPY  2011   X2  . COLONOSCOPY N/A 07/27/2016   Procedure: COLONOSCOPY IN O.R;  Surgeon: Robert Bellow, MD;  Location: [facility] ORS;  Service: General;  Laterality: N/A;  . IUD REMOVAL    . LAPAROSCOPIC PARTIAL COLECTOMY Left 08/21/2016   Procedure: LAPAROSCOPIC ASSISTED LEFT HEMI-COLECTOMY;  Surgeon: Robert Bellow, MD;  Location: [facility] ORS;  Service: General;  Laterality: Left;  . RECTAL EXAM UNDER ANESTHESIA N/A 07/27/2016   Procedure: RECTAL EXAM UNDER ANESTHESIA;  Surgeon: Robert Bellow, MD;  Location: [facility] ORS;  Service: General;  Laterality: N/A;  . TONSILLECTOMY    . TOOTH EXTRACTION    . TRANSANAL EXCISION OF RECTAL MASS  07/27/2016   Procedure: EXCISION OF RECTAL MASS;  Surgeon: Robert Bellow, MD;  Location: [facility] ORS;  Service: General;;  . WISDOM TOOTH EXTRACTION      Family History  Problem Relation Age of Onset  . Pancreatic cancer Mother   . Breast cancer Paternal Aunt     Social History Social  History  Substance Use Topics  . Smoking status: Former Smoker    Packs/day: 0.50    Years: 4.00    Quit date: 01/26/1971  . Smokeless tobacco: Never Used  . Alcohol use Yes     Comment: occasionally with dinner    Allergies  Allergen Reactions  . Adhesive [Tape]     FOR  LONG PERIODS OF TIME  . Codeine Other (See Comments)    Headaches Heart palpations  . Statins     Muscle pain, joint pain    Current Outpatient Prescriptions  Medication Sig Dispense Refill  . amLODipine-benazepril (LOTREL) 5-10 MG capsule TAKE 1 CAPSULE BY MOUTH DAILY FOR BLOOD PRESSURE (Patient taking differently: TAKE 1 CAPSULE BY MOUTH DAILY FOR BLOOD PRESSURE-AM) 30 capsule 12  . ezetimibe (ZETIA) 10 MG tablet Take 1 tablet (10 mg total) by mouth every morning. 30 tablet 12  . hydrochlorothiazide (MICROZIDE) 12.5 MG capsule TAKE ONE CAPSULE EVERYDAY FOR BLOOD PRESSURE/ FLUID 90 capsule 3  . HYDROcodone-acetaminophen (NORCO) 7.5-325 MG tablet Take 1 tablet by mouth every 4 (four) hours as needed for moderate pain. 30 tablet 0  . levothyroxine (SYNTHROID, LEVOTHROID) 100 MCG tablet TAKE 1 TABLET EVERY DAY ON AN EMPTY STOMACH WITH A GLASS OF WATER AT LEAST 30-60 MINUTES BEFORE BREAKFAST FOR THYROID 90 tablet 3  .  metoprolol succinate (TOPROL-XL) 100 MG 24 hr tablet Take 1 tablet (100 mg total) by mouth daily. Take with or immediately following a meal. 14 tablet 0  . Multiple Vitamins-Minerals (CENTRUM SILVER ADULT 50+ PO) Take 1 tablet by mouth daily.    . pioglitazone (ACTOS) 45 MG tablet Take 1 tablet (45 mg total) by mouth daily. 30 tablet 3  . enoxaparin (LOVENOX) 40 MG/0.4ML injection Inject 0.4 mLs (40 mg total) into the skin daily. (Patient not taking: Reported on 09/01/2016) 14 Syringe 0   No current facility-administered medications for this visit.     Review of Systems Review of Systems  Constitutional: Negative.   Respiratory: Negative.   Cardiovascular: Negative.     Blood pressure (!) 142/80,  pulse 83, resp. rate 14, height '5\' 2"'$  (1.575 m), weight 164 lb (74.4 kg).  Physical Exam Physical Exam  Constitutional: She is oriented to person, place, and time. She appears well-developed and well-nourished.  Cardiovascular: Normal rate, regular rhythm and normal heart sounds.   Pulmonary/Chest: Effort normal and breath sounds normal.  Abdominal: Soft. Bowel sounds are normal. There is no tenderness.    Neurological: She is alert and oriented to person, place, and time.  Skin: Skin is warm and dry.    Data Reviewed T3, N1, M0. No microsatellite instability noted.  Assessment    Doing well status post excision of the descending colon tumor.  Resolving perianal pain from the excision of the low, low rectal tumor (T1).    Plan    The patient is doing very well. She had questions about reinstitution of glipizide and increasing her Tenormin back to baseline. She had been sent home off glipizide as her sugars were running in the low 100s. This is certainly risen with resumption of her normal diet. Her blood pressure is great today, and she had been fairly low normal on 200 mg of Tenormin. She'll resume her regular dose of both these medications.  She'll be evaluated by medical oncology on 09/03/2016 for further recommendations regarding adjuvant chemotherapy.  CEA was obtained within the first 2 days after surgery, but likely represents that she did not have a tumor secreting this antigen.  She'll increase her activity as tolerated, and may resume driving when she is pain-free.    Patient to start back on her glipizide. Patient to return in two weeks. The patient is aware to call back for any questions or concerns.   HPI, Physical Exam, Assessment and Plan have been scribed under the direction and in the presence of Hervey Ard, MD.  Gaspar Cola, CMA  I have completed the exam and reviewed the above documentation for accuracy and completeness.  I agree with the above.   Haematologist has been used and any errors in dictation or transcription are unintentional.  Hervey Ard, M.D., F.A.C.S.  Robert Bellow 09/01/2016, 9:00 PM

## 2016-09-03 ENCOUNTER — Ambulatory Visit: Payer: Self-pay

## 2016-09-03 ENCOUNTER — Encounter: Payer: Self-pay | Admitting: Hematology and Oncology

## 2016-09-03 ENCOUNTER — Inpatient Hospital Stay: Payer: Medicare Other | Attending: Hematology and Oncology | Admitting: Hematology and Oncology

## 2016-09-03 ENCOUNTER — Inpatient Hospital Stay: Payer: Medicare Other

## 2016-09-03 VITALS — BP 116/73 | HR 93 | Temp 97.4°F | Resp 18 | Wt 164.4 lb

## 2016-09-03 DIAGNOSIS — Z8719 Personal history of other diseases of the digestive system: Secondary | ICD-10-CM | POA: Diagnosis not present

## 2016-09-03 DIAGNOSIS — C186 Malignant neoplasm of descending colon: Secondary | ICD-10-CM | POA: Insufficient documentation

## 2016-09-03 DIAGNOSIS — E039 Hypothyroidism, unspecified: Secondary | ICD-10-CM | POA: Insufficient documentation

## 2016-09-03 DIAGNOSIS — Z79899 Other long term (current) drug therapy: Secondary | ICD-10-CM | POA: Insufficient documentation

## 2016-09-03 DIAGNOSIS — Z87891 Personal history of nicotine dependence: Secondary | ICD-10-CM

## 2016-09-03 DIAGNOSIS — D649 Anemia, unspecified: Secondary | ICD-10-CM

## 2016-09-03 DIAGNOSIS — K219 Gastro-esophageal reflux disease without esophagitis: Secondary | ICD-10-CM | POA: Diagnosis not present

## 2016-09-03 DIAGNOSIS — E119 Type 2 diabetes mellitus without complications: Secondary | ICD-10-CM

## 2016-09-03 DIAGNOSIS — C20 Malignant neoplasm of rectum: Secondary | ICD-10-CM | POA: Insufficient documentation

## 2016-09-03 DIAGNOSIS — M129 Arthropathy, unspecified: Secondary | ICD-10-CM | POA: Insufficient documentation

## 2016-09-03 DIAGNOSIS — I10 Essential (primary) hypertension: Secondary | ICD-10-CM | POA: Diagnosis not present

## 2016-09-03 DIAGNOSIS — Z7984 Long term (current) use of oral hypoglycemic drugs: Secondary | ICD-10-CM | POA: Diagnosis not present

## 2016-09-03 DIAGNOSIS — E7800 Pure hypercholesterolemia, unspecified: Secondary | ICD-10-CM

## 2016-09-03 LAB — CBC WITH DIFFERENTIAL/PLATELET
Basophils Absolute: 0.1 10*3/uL (ref 0–0.1)
Basophils Relative: 1 %
Eosinophils Absolute: 0.3 10*3/uL (ref 0–0.7)
Eosinophils Relative: 5 %
HCT: 28.7 % — ABNORMAL LOW (ref 35.0–47.0)
Hemoglobin: 9.7 g/dL — ABNORMAL LOW (ref 12.0–16.0)
Lymphocytes Relative: 20 %
Lymphs Abs: 1.3 10*3/uL (ref 1.0–3.6)
MCH: 32.9 pg (ref 26.0–34.0)
MCHC: 33.9 g/dL (ref 32.0–36.0)
MCV: 96.9 fL (ref 80.0–100.0)
Monocytes Absolute: 0.6 10*3/uL (ref 0.2–0.9)
Monocytes Relative: 9 %
Neutro Abs: 4.2 10*3/uL (ref 1.4–6.5)
Neutrophils Relative %: 65 %
Platelets: 433 10*3/uL (ref 150–440)
RBC: 2.96 MIL/uL — ABNORMAL LOW (ref 3.80–5.20)
RDW: 14.2 % (ref 11.5–14.5)
WBC: 6.4 10*3/uL (ref 3.6–11.0)

## 2016-09-03 LAB — IRON AND TIBC
Iron: 81 ug/dL (ref 28–170)
Saturation Ratios: 20 % (ref 10.4–31.8)
TIBC: 401 ug/dL (ref 250–450)
UIBC: 320 ug/dL

## 2016-09-03 LAB — BASIC METABOLIC PANEL
Anion gap: 9 (ref 5–15)
BUN: 28 mg/dL — ABNORMAL HIGH (ref 6–20)
CO2: 27 mmol/L (ref 22–32)
Calcium: 9.8 mg/dL (ref 8.9–10.3)
Chloride: 102 mmol/L (ref 101–111)
Creatinine, Ser: 1.72 mg/dL — ABNORMAL HIGH (ref 0.44–1.00)
GFR calc Af Amer: 32 mL/min — ABNORMAL LOW (ref >60)
GFR calc non Af Amer: 27 mL/min — ABNORMAL LOW (ref >60)
Glucose, Bld: 94 mg/dL (ref 65–99)
Potassium: 3.5 mmol/L (ref 3.5–5.1)
Sodium: 138 mmol/L (ref 135–145)

## 2016-09-03 LAB — FERRITIN: Ferritin: 64 ng/mL (ref 11–307)

## 2016-09-03 MED ORDER — LORAZEPAM 0.5 MG PO TABS: 0.5000 mg | ORAL_TABLET | Freq: Once | ORAL | 0 refills | Status: AC

## 2016-09-03 NOTE — Progress Notes (Signed)
Patient here today as new evaluation regarding colon cancer.  Referred by Dr. Bary Castilla.

## 2016-09-03 NOTE — Progress Notes (Signed)
  Oncology Nurse Navigator Documentation Met with Tracy Silva and her daughter prior to, during, and after consult with Dr. Mike Gip. Introduced Therapist, nutritional and provided contact information for future needs. Education provided on ativan prior to MRI.  Navigator Location: [facility]-Med Onc (09/03/16 1000)   )Navigator Encounter Type: Initial MedOnc (09/03/16 1000)                     Patient Visit Type: MedOnc;Initial (09/03/16 1000)                              Time Spent with Patient: 60 (09/03/16 1000)

## 2016-09-03 NOTE — Patient Instructions (Signed)
Oxaliplatin Injection What is this medicine? OXALIPLATIN (ox AL i PLA tin) is a chemotherapy drug. It targets fast dividing cells, like cancer cells, and causes these cells to die. This medicine is used to treat cancers of the colon and rectum, and many other cancers. This medicine may be used for other purposes; ask your health care provider or pharmacist if you have questions. COMMON BRAND NAME(S): Eloxatin What should I tell my health care provider before I take this medicine? They need to know if you have any of these conditions: -kidney disease -an unusual or allergic reaction to oxaliplatin, other chemotherapy, other medicines, foods, dyes, or preservatives -pregnant or trying to get pregnant -breast-feeding How should I use this medicine? This drug is given as an infusion into a vein. It is administered in a hospital or clinic by a specially trained health care professional. Talk to your pediatrician regarding the use of this medicine in children. Special care may be needed. Overdosage: If you think you have taken too much of this medicine contact a poison control center or emergency room at once. NOTE: This medicine is only for you. Do not share this medicine with others. What if I miss a dose? It is important not to miss a dose. Call your doctor or health care professional if you are unable to keep an appointment. What may interact with this medicine? -medicines to increase blood counts like filgrastim, pegfilgrastim, sargramostim -probenecid -some antibiotics like amikacin, gentamicin, neomycin, polymyxin B, streptomycin, tobramycin -zalcitabine Talk to your doctor or health care professional before taking any of these medicines: -acetaminophen -aspirin -ibuprofen -ketoprofen -naproxen This list may not describe all possible interactions. Give your health care provider a list of all the medicines, herbs, non-prescription drugs, or dietary supplements you use. Also tell them if  you smoke, drink alcohol, or use illegal drugs. Some items may interact with your medicine. What should I watch for while using this medicine? Your condition will be monitored carefully while you are receiving this medicine. You will need important blood work done while you are taking this medicine. This medicine can make you more sensitive to cold. Do not drink cold drinks or use ice. Cover exposed skin before coming in contact with cold temperatures or cold objects. When out in cold weather wear warm clothing and cover your mouth and nose to warm the air that goes into your lungs. Tell your doctor if you get sensitive to the cold. This drug may make you feel generally unwell. This is not uncommon, as chemotherapy can affect healthy cells as well as cancer cells. Report any side effects. Continue your course of treatment even though you feel ill unless your doctor tells you to stop. In some cases, you may be given additional medicines to help with side effects. Follow all directions for their use. Call your doctor or health care professional for advice if you get a fever, chills or sore throat, or other symptoms of a cold or flu. Do not treat yourself. This drug decreases your body's ability to fight infections. Try to avoid being around people who are sick. This medicine may increase your risk to bruise or bleed. Call your doctor or health care professional if you notice any unusual bleeding. Be careful brushing and flossing your teeth or using a toothpick because you may get an infection or bleed more easily. If you have any dental work done, tell your dentist you are receiving this medicine. Avoid taking products that contain aspirin, acetaminophen, ibuprofen, naproxen,   or ketoprofen unless instructed by your doctor. These medicines may hide a fever. Do not become pregnant while taking this medicine. Women should inform their doctor if they wish to become pregnant or think they might be pregnant. There  is a potential for serious side effects to an unborn child. Talk to your health care professional or pharmacist for more information. Do not breast-feed an infant while taking this medicine. Call your doctor or health care professional if you get diarrhea. Do not treat yourself. What side effects may I notice from receiving this medicine? Side effects that you should report to your doctor or health care professional as soon as possible: -allergic reactions like skin rash, itching or hives, swelling of the face, lips, or tongue -low blood counts - This drug may decrease the number of white blood cells, red blood cells and platelets. You may be at increased risk for infections and bleeding. -signs of infection - fever or chills, cough, sore throat, pain or difficulty passing urine -signs of decreased platelets or bleeding - bruising, pinpoint red spots on the skin, black, tarry stools, nosebleeds -signs of decreased red blood cells - unusually weak or tired, fainting spells, lightheadedness -breathing problems -chest pain, pressure -cough -diarrhea -jaw tightness -mouth sores -nausea and vomiting -pain, swelling, redness or irritation at the injection site -pain, tingling, numbness in the hands or feet -problems with balance, talking, walking -redness, blistering, peeling or loosening of the skin, including inside the mouth -trouble passing urine or change in the amount of urine Side effects that usually do not require medical attention (report to your doctor or health care professional if they continue or are bothersome): -changes in vision -constipation -hair loss -loss of appetite -metallic taste in the mouth or changes in taste -stomach pain This list may not describe all possible side effects. Call your doctor for medical advice about side effects. You may report side effects to FDA at 1-800-FDA-1088. Where should I keep my medicine? This drug is given in a hospital or clinic and  will not be stored at home. NOTE: This sheet is a summary. It may not cover all possible information. If you have questions about this medicine, talk to your doctor, pharmacist, or health care provider.  2018 [employer] Standard (2007-08-09 17:22:47) Fluorouracil, 5-FU injection What is this medicine? FLUOROURACIL, 5-FU (flure oh YOOR a sil) is a chemotherapy drug. It slows the growth of cancer cells. This medicine is used to treat many types of cancer like breast cancer, colon or rectal cancer, pancreatic cancer, and stomach cancer. This medicine may be used for other purposes; ask your health care provider or pharmacist if you have questions. COMMON BRAND NAME(S): Adrucil What should I tell my health care provider before I take this medicine? They need to know if you have any of these conditions: -blood disorders -dihydropyrimidine dehydrogenase (DPD) deficiency -infection (especially a virus infection such as chickenpox, cold sores, or herpes) -kidney disease -liver disease -malnourished, poor nutrition -recent or ongoing radiation therapy -an unusual or allergic reaction to fluorouracil, other chemotherapy, other medicines, foods, dyes, or preservatives -pregnant or trying to get pregnant -breast-feeding How should I use this medicine? This drug is given as an infusion or injection into a vein. It is administered in a hospital or clinic by a specially trained health care professional. Talk to your pediatrician regarding the use of this medicine in children. Special care may be needed. Overdosage: If you think you have taken too much of this medicine contact a   poison control center or emergency room at once. NOTE: This medicine is only for you. Do not share this medicine with others. What if I miss a dose? It is important not to miss your dose. Call your doctor or health care professional if you are unable to keep an appointment. What may interact with this  medicine? -allopurinol -cimetidine -dapsone -digoxin -hydroxyurea -leucovorin -levamisole -medicines for seizures like ethotoin, fosphenytoin, phenytoin -medicines to increase blood counts like filgrastim, pegfilgrastim, sargramostim -medicines that treat or prevent blood clots like warfarin, enoxaparin, and dalteparin -methotrexate -metronidazole -pyrimethamine -some other chemotherapy drugs like busulfan, cisplatin, estramustine, vinblastine -trimethoprim -trimetrexate -vaccines Talk to your doctor or health care professional before taking any of these medicines: -acetaminophen -aspirin -ibuprofen -ketoprofen -naproxen This list may not describe all possible interactions. Give your health care provider a list of all the medicines, herbs, non-prescription drugs, or dietary supplements you use. Also tell them if you smoke, drink alcohol, or use illegal drugs. Some items may interact with your medicine. What should I watch for while using this medicine? Visit your doctor for checks on your progress. This drug may make you feel generally unwell. This is not uncommon, as chemotherapy can affect healthy cells as well as cancer cells. Report any side effects. Continue your course of treatment even though you feel ill unless your doctor tells you to stop. In some cases, you may be given additional medicines to help with side effects. Follow all directions for their use. Call your doctor or health care professional for advice if you get a fever, chills or sore throat, or other symptoms of a cold or flu. Do not treat yourself. This drug decreases your body's ability to fight infections. Try to avoid being around people who are sick. This medicine may increase your risk to bruise or bleed. Call your doctor or health care professional if you notice any unusual bleeding. Be careful brushing and flossing your teeth or using a toothpick because you may get an infection or bleed more easily. If you  have any dental work done, tell your dentist you are receiving this medicine. Avoid taking products that contain aspirin, acetaminophen, ibuprofen, naproxen, or ketoprofen unless instructed by your doctor. These medicines may hide a fever. Do not become pregnant while taking this medicine. Women should inform their doctor if they wish to become pregnant or think they might be pregnant. There is a potential for serious side effects to an unborn child. Talk to your health care professional or pharmacist for more information. Do not breast-feed an infant while taking this medicine. Men should inform their doctor if they wish to father a child. This medicine may lower sperm counts. Do not treat diarrhea with over the counter products. Contact your doctor if you have diarrhea that lasts more than 2 days or if it is severe and watery. This medicine can make you more sensitive to the sun. Keep out of the sun. If you cannot avoid being in the sun, wear protective clothing and use sunscreen. Do not use sun lamps or tanning beds/booths. What side effects may I notice from receiving this medicine? Side effects that you should report to your doctor or health care professional as soon as possible: -allergic reactions like skin rash, itching or hives, swelling of the face, lips, or tongue -low blood counts - this medicine may decrease the number of white blood cells, red blood cells and platelets. You may be at increased risk for infections and bleeding. -signs of infection -  fever or chills, cough, sore throat, pain or difficulty passing urine -signs of decreased platelets or bleeding - bruising, pinpoint red spots on the skin, black, tarry stools, blood in the urine -signs of decreased red blood cells - unusually weak or tired, fainting spells, lightheadedness -breathing problems -changes in vision -chest pain -mouth sores -nausea and vomiting -pain, swelling, redness at site where injected -pain, tingling,  numbness in the hands or feet -redness, swelling, or sores on hands or feet -stomach pain -unusual bleeding Side effects that usually do not require medical attention (report to your doctor or health care professional if they continue or are bothersome): -changes in finger or toe nails -diarrhea -dry or itchy skin -hair loss -headache -loss of appetite -sensitivity of eyes to the light -stomach upset -unusually teary eyes This list may not describe all possible side effects. Call your doctor for medical advice about side effects. You may report side effects to FDA at 1-800-FDA-1088. Where should I keep my medicine? This drug is given in a hospital or clinic and will not be stored at home. NOTE: This sheet is a summary. It may not cover all possible information. If you have questions about this medicine, talk to your doctor, pharmacist, or health care provider.  2018 [employer] Standard (2007-05-18 13:53:16) Capecitabine tablets What is this medicine? CAPECITABINE (ka pe SITE a been) is a chemotherapy drug. It slows the growth of cancer cells. This medicine is used to treat breast cancer, and also colon or rectal cancer. This medicine may be used for other purposes; ask your health care provider or pharmacist if you have questions. COMMON BRAND NAME(S): Xeloda What should I tell my health care provider before I take this medicine? They need to know if you have any of these conditions: -bleeding or blood disorders -dihydropyrimidine dehydrogenase (DPD) deficiency -heart disease -infection (especially a virus infection such as chickenpox, cold sores, or herpes) -kidney disease -liver disease -an unusual or allergic reaction to capecitabine, 5-fluorouracil, other medicines, foods, dyes, or preservatives -pregnant or trying to get pregnant -breast-feeding How should I use this medicine? Take this medicine by mouth with a glass of water, within 30 minutes of the end of a meal. Do  not cut, crush or chew this medicine. Follow the directions on the prescription label. Take your medicine at regular intervals. Do not take it more often than directed. Do not stop taking except on your doctor's advice. Your doctor may want you to take a combination of 150 mg and 500 mg tablets for each dose. It is very important that you know how to correctly take your dose. Taking the wrong tablets could result in an overdose (too much medication) or underdose (too little medication). Talk to your pediatrician regarding the use of this medicine in children. Special care may be needed. Overdosage: If you think you have taken too much of this medicine contact a poison control center or emergency room at once. NOTE: This medicine is only for you. Do not share this medicine with others. What if I miss a dose? If you miss a dose, do not take the missed dose at all. Do not take double or extra doses. Instead, continue with your next scheduled dose and check with your doctor. What may interact with this medicine? -antacids with aluminum and/or magnesium -folic acid -leucovorin -medicines to increase blood counts like filgrastim, pegfilgrastim, sargramostim -phenytoin -vaccines -warfarin Talk to your doctor or health care professional before taking any of these medicines: -acetaminophen -aspirin -ibuprofen -ketoprofen -  naproxen This list may not describe all possible interactions. Give your health care provider a list of all the medicines, herbs, non-prescription drugs, or dietary supplements you use. Also tell them if you smoke, drink alcohol, or use illegal drugs. Some items may interact with your medicine. What should I watch for while using this medicine? Visit your doctor for checks on your progress. This drug may make you feel generally unwell. This is not uncommon, as chemotherapy can affect healthy cells as well as cancer cells. Report any side effects. Continue your course of treatment even  though you feel ill unless your doctor tells you to stop. In some cases, you may be given additional medicines to help with side effects. Follow all directions for their use. Call your doctor or health care professional for advice if you get a fever, chills or sore throat, or other symptoms of a cold or flu. Do not treat yourself. This drug decreases your body's ability to fight infections. Try to avoid being around people who are sick. This medicine may increase your risk to bruise or bleed. Call your doctor or health care professional if you notice any unusual bleeding. Be careful brushing and flossing your teeth or using a toothpick because you may get an infection or bleed more easily. If you have any dental work done, tell your dentist you are receiving this medicine. Avoid taking products that contain aspirin, acetaminophen, ibuprofen, naproxen, or ketoprofen unless instructed by your doctor. These medicines may hide a fever. Do not become pregnant while taking this medicine or for 6 months after stopping it. Women should inform their doctor if they wish to become pregnant or think they might be pregnant. There is a potential for serious side effects to an unborn child. Talk to your health care professional or pharmacist for more information. Do not breast-feed an infant while taking this medicine or for 2 weeks after stopping it. Men are advised not to father a child while taking this medicine or for 3 months after stopping it. This medicine may make it more difficult to get pregnant or father a child. Talk with your doctor or health care professional if you are concerned about your fertility. What side effects may I notice from receiving this medicine? Side effects that you should report to your doctor or health care professional as soon as possible: -allergic reactions like skin rash, itching or hives, swelling of the face, lips, or tongue -low blood counts - this medicine may decrease the  number of white blood cells, red blood cells and platelets. You may be at increased risk for infections and bleeding. -signs of infection - fever or chills, cough, sore throat, pain or difficulty passing urine -signs of decreased platelets or bleeding - bruising, pinpoint red spots on the skin, black, tarry stools, blood in the urine -signs of decreased red blood cells - unusually weak or tired, fainting spells, lightheadedness -breathing problems -changes in vision -chest pain -dark urine -diarrhea of more than 4 bowel movements in one day or any diarrhea at night; bloody or watery diarrhea -dizziness -mouth sores -nausea and vomiting -pain, tingling, numbness in the hands or feet -redness, swelling, or sores on hands or feet -stomach pain -vomiting -yellow color of skin or eyes Side effects that usually do not require medical attention (report to your doctor or health care professional if they continue or are bothersome): -constipation -diarrhea -dry or itchy skin -hair loss -loss of appetite -nausea -weak or tired This list may not  describe all possible side effects. Call your doctor for medical advice about side effects. You may report side effects to FDA at 1-800-FDA-1088. Where should I keep my medicine? Keep out of the reach of children. Store at room temperature between 15 and 30 degrees C (59 and 86 degrees F). Keep container tightly closed. Throw away any unused medicine after the expiration date. NOTE: This sheet is a summary. It may not cover all possible information. If you have questions about this medicine, talk to your doctor, pharmacist, or health care provider.  2018 [employer] Standard (2015-01-30 13:11:21)

## 2016-09-03 NOTE — Patient Instructions (Addendum)
Oxaliplatin Injection What is this medicine? OXALIPLATIN (ox AL i PLA tin) is a chemotherapy drug. It targets fast dividing cells, like cancer cells, and causes these cells to die. This medicine is used to treat cancers of the colon and rectum, and many other cancers. This medicine may be used for other purposes; ask your health care provider or pharmacist if you have questions. COMMON BRAND NAME(S): Eloxatin What should I tell my health care provider before I take this medicine? They need to know if you have any of these conditions: -kidney disease -an unusual or allergic reaction to oxaliplatin, other chemotherapy, other medicines, foods, dyes, or preservatives -pregnant or trying to get pregnant -breast-feeding How should I use this medicine? This drug is given as an infusion into a vein. It is administered in a hospital or clinic by a specially trained health care professional. Talk to your pediatrician regarding the use of this medicine in children. Special care may be needed. Overdosage: If you think you have taken too much of this medicine contact a poison control center or emergency room at once. NOTE: This medicine is only for you. Do not share this medicine with others. What if I miss a dose? It is important not to miss a dose. Call your doctor or health care professional if you are unable to keep an appointment. What may interact with this medicine? -medicines to increase blood counts like filgrastim, pegfilgrastim, sargramostim -probenecid -some antibiotics like amikacin, gentamicin, neomycin, polymyxin B, streptomycin, tobramycin -zalcitabine Talk to your doctor or health care professional before taking any of these medicines: -acetaminophen -aspirin -ibuprofen -ketoprofen -naproxen This list may not describe all possible interactions. Give your health care provider a list of all the medicines, herbs, non-prescription drugs, or dietary supplements you use. Also tell them if  you smoke, drink alcohol, or use illegal drugs. Some items may interact with your medicine. What should I watch for while using this medicine? Your condition will be monitored carefully while you are receiving this medicine. You will need important blood work done while you are taking this medicine. This medicine can make you more sensitive to cold. Do not drink cold drinks or use ice. Cover exposed skin before coming in contact with cold temperatures or cold objects. When out in cold weather wear warm clothing and cover your mouth and nose to warm the air that goes into your lungs. Tell your doctor if you get sensitive to the cold. This drug may make you feel generally unwell. This is not uncommon, as chemotherapy can affect healthy cells as well as cancer cells. Report any side effects. Continue your course of treatment even though you feel ill unless your doctor tells you to stop. In some cases, you may be given additional medicines to help with side effects. Follow all directions for their use. Call your doctor or health care professional for advice if you get a fever, chills or sore throat, or other symptoms of a cold or flu. Do not treat yourself. This drug decreases your body's ability to fight infections. Try to avoid being around people who are sick. This medicine may increase your risk to bruise or bleed. Call your doctor or health care professional if you notice any unusual bleeding. Be careful brushing and flossing your teeth or using a toothpick because you may get an infection or bleed more easily. If you have any dental work done, tell your dentist you are receiving this medicine. Avoid taking products that contain aspirin, acetaminophen, ibuprofen, naproxen,   or ketoprofen unless instructed by your doctor. These medicines may hide a fever. Do not become pregnant while taking this medicine. Women should inform their doctor if they wish to become pregnant or think they might be pregnant. There  is a potential for serious side effects to an unborn child. Talk to your health care professional or pharmacist for more information. Do not breast-feed an infant while taking this medicine. Call your doctor or health care professional if you get diarrhea. Do not treat yourself. What side effects may I notice from receiving this medicine? Side effects that you should report to your doctor or health care professional as soon as possible: -allergic reactions like skin rash, itching or hives, swelling of the face, lips, or tongue -low blood counts - This drug may decrease the number of white blood cells, red blood cells and platelets. You may be at increased risk for infections and bleeding. -signs of infection - fever or chills, cough, sore throat, pain or difficulty passing urine -signs of decreased platelets or bleeding - bruising, pinpoint red spots on the skin, black, tarry stools, nosebleeds -signs of decreased red blood cells - unusually weak or tired, fainting spells, lightheadedness -breathing problems -chest pain, pressure -cough -diarrhea -jaw tightness -mouth sores -nausea and vomiting -pain, swelling, redness or irritation at the injection site -pain, tingling, numbness in the hands or feet -problems with balance, talking, walking -redness, blistering, peeling or loosening of the skin, including inside the mouth -trouble passing urine or change in the amount of urine Side effects that usually do not require medical attention (report to your doctor or health care professional if they continue or are bothersome): -changes in vision -constipation -hair loss -loss of appetite -metallic taste in the mouth or changes in taste -stomach pain This list may not describe all possible side effects. Call your doctor for medical advice about side effects. You may report side effects to FDA at 1-800-FDA-1088. Where should I keep my medicine? This drug is given in a hospital or clinic and  will not be stored at home. NOTE: This sheet is a summary. It may not cover all possible information. If you have questions about this medicine, talk to your doctor, pharmacist, or health care provider.  2018 [employer] Standard (2007-08-09 17:22:47) Fluorouracil, 5FU; Diclofenac topical cream What is this medicine? FLUOROURACIL; DICLOFENAC (flure oh YOOR a sil; dye KLOE fen ak) is a combination of a topical chemotherapy agent and non-steroidal anti-inflammatory drug (NSAID). It is used on the skin to treat skin cancer and skin conditions that could become cancer. This medicine may be used for other purposes; ask your health care provider or pharmacist if you have questions. COMMON BRAND NAME(S): FLUORAC What should I tell my health care provider before I take this medicine? They need to know if you have any of these conditions: -bleeding problems -cigarette smoker -DPD enzyme deficiency -heart disease -high blood pressure -if you frequently drink alcohol containing drinks -kidney disease -liver disease -open or infected skin -stomach problems -swelling or open sores at the treatment site -recent or planned coronary artery bypass graft (CABG) surgery -an unusual or allergic reaction to fluorouracil, diclofenac, aspirin, other NSAIDs, other medicines, foods, dyes, or preservatives -pregnant or trying to get pregnant -breast-feeding How should I use this medicine? This medicine is only for use on the skin. Follow the directions on the prescription label. Wash hands before and after use. Wash affected area and gently pat dry. To apply this medicine use a cotton-tipped applicator,  or use gloves if applying with fingertips. If applied with unprotected fingertips, it is very important to wash your hands well after you apply this medicine. Avoid applying to the eyes, nose, or mouth. Apply enough medicine to cover the affected area. You can cover the area with a light gauze dressing, but do not  use tight or air-tight dressings. Finish the full course prescribed by your doctor or health care professional, even if you think your condition is better. Do not stop taking except on the advice of your doctor or health care professional. Talk to your pediatrician regarding the use of this medicine in children. Special care may be needed. Overdosage: If you think you have taken too much of this medicine contact a poison control center or emergency room at once. NOTE: This medicine is only for you. Do not share this medicine with others. What if I miss a dose? If you miss a dose, apply it as soon as you can. If it is almost time for your next dose, only use that dose. Do not apply extra doses. Contact your doctor or health care professional if you miss more than one dose. What may interact with this medicine? Interactions are not expected. Do not use any other skin products without telling your doctor or health care professional. This list may not describe all possible interactions. Give your health care provider a list of all the medicines, herbs, non-prescription drugs, or dietary supplements you use. Also tell them if you smoke, drink alcohol, or use illegal drugs. Some items may interact with your medicine. What should I watch for while using this medicine? Visit your doctor or health care professional for checks on your progress. You will need to use this medicine for 2 to 6 weeks. This may be longer depending on the condition being treated. You may not see full healing for another 1 to 2 months after you stop using the medicine. Treated areas of skin can look unsightly during and for several weeks after treatment with this medicine. This medicine can make you more sensitive to the sun. Keep out of the sun. If you cannot avoid being in the sun, wear protective clothing and use sunscreen. Do not use sun lamps or tanning beds/booths. If a pet comes in contact with the area where this medicine was  applied to your skin or if it is ingested, they may have a serious risk of side effects. If accidental contact happens, the skin of the pet should be washed right away with soap and water. Contact your vet right away if your pet becomes exposed. Do not become pregnant while taking this medicine. Women should inform their doctor if they wish to become pregnant or think they might be pregnant. There is a potential for serious side effects to an unborn child. Talk to your health care professional or pharmacist for more information. What side effects may I notice from receiving this medicine? Side effects that you should report to your doctor or health care professional as soon as possible: -allergic reactions like skin rash, itching or hives, swelling of the face, lips, or tongue -black or bloody stools, blood in the urine or vomit -blurred vision -chest pain -difficulty breathing or wheezing -redness, blistering, peeling or loosening of the skin, including inside the mouth -severe redness and swelling of normal skin -slurred speech or weakness on one side of the body -trouble passing urine or change in the amount of urine -unexplained weight gain or swelling -unusually weak or  tired -yellowing of eyes or skin Side effects that usually do not require medical attention (report to your doctor or health care professional if they continue or are bothersome): -increased sensitivity of the skin to sun and ultraviolet light -pain and burning of the affected area -scaling or swelling of the affected area -skin rash, itching of the affected area -tenderness This list may not describe all possible side effects. Call your doctor for medical advice about side effects. You may report side effects to FDA at 1-800-FDA-1088. Where should I keep my medicine? Keep out of the reach of children and pets. Store at room temperature between 20 and 25 degrees C (68 and 77 degrees F). Throw away any unused medicine  after the expiration date. NOTE: This sheet is a summary. It may not cover all possible information. If you have questions about this medicine, talk to your doctor, pharmacist, or health care provider.  2018 [employer]/Gold Standard (2015-02-22 17:35:08) Capecitabine tablets What is this medicine? CAPECITABINE (ka pe SITE a been) is a chemotherapy drug. It slows the growth of cancer cells. This medicine is used to treat breast cancer, and also colon or rectal cancer. This medicine may be used for other purposes; ask your health care provider or pharmacist if you have questions. COMMON BRAND NAME(S): Xeloda What should I tell my health care provider before I take this medicine? They need to know if you have any of these conditions: -bleeding or blood disorders -dihydropyrimidine dehydrogenase (DPD) deficiency -heart disease -infection (especially a virus infection such as chickenpox, cold sores, or herpes) -kidney disease -liver disease -an unusual or allergic reaction to capecitabine, 5-fluorouracil, other medicines, foods, dyes, or preservatives -pregnant or trying to get pregnant -breast-feeding How should I use this medicine? Take this medicine by mouth with a glass of water, within 30 minutes of the end of a meal. Do not cut, crush or chew this medicine. Follow the directions on the prescription label. Take your medicine at regular intervals. Do not take it more often than directed. Do not stop taking except on your doctor's advice. Your doctor may want you to take a combination of 150 mg and 500 mg tablets for each dose. It is very important that you know how to correctly take your dose. Taking the wrong tablets could result in an overdose (too much medication) or underdose (too little medication). Talk to your pediatrician regarding the use of this medicine in children. Special care may be needed. Overdosage: If you think you have taken too much of this medicine contact a poison control  center or emergency room at once. NOTE: This medicine is only for you. Do not share this medicine with others. What if I miss a dose? If you miss a dose, do not take the missed dose at all. Do not take double or extra doses. Instead, continue with your next scheduled dose and check with your doctor. What may interact with this medicine? -antacids with aluminum and/or magnesium -folic acid -leucovorin -medicines to increase blood counts like filgrastim, pegfilgrastim, sargramostim -phenytoin -vaccines -warfarin Talk to your doctor or health care professional before taking any of these medicines: -acetaminophen -aspirin -ibuprofen -ketoprofen -naproxen This list may not describe all possible interactions. Give your health care provider a list of all the medicines, herbs, non-prescription drugs, or dietary supplements you use. Also tell them if you smoke, drink alcohol, or use illegal drugs. Some items may interact with your medicine. What should I watch for while using this medicine? Visit your  doctor for checks on your progress. This drug may make you feel generally unwell. This is not uncommon, as chemotherapy can affect healthy cells as well as cancer cells. Report any side effects. Continue your course of treatment even though you feel ill unless your doctor tells you to stop. In some cases, you may be given additional medicines to help with side effects. Follow all directions for their use. Call your doctor or health care professional for advice if you get a fever, chills or sore throat, or other symptoms of a cold or flu. Do not treat yourself. This drug decreases your body's ability to fight infections. Try to avoid being around people who are sick. This medicine may increase your risk to bruise or bleed. Call your doctor or health care professional if you notice any unusual bleeding. Be careful brushing and flossing your teeth or using a toothpick because you may get an infection or  bleed more easily. If you have any dental work done, tell your dentist you are receiving this medicine. Avoid taking products that contain aspirin, acetaminophen, ibuprofen, naproxen, or ketoprofen unless instructed by your doctor. These medicines may hide a fever. Do not become pregnant while taking this medicine or for 6 months after stopping it. Women should inform their doctor if they wish to become pregnant or think they might be pregnant. There is a potential for serious side effects to an unborn child. Talk to your health care professional or pharmacist for more information. Do not breast-feed an infant while taking this medicine or for 2 weeks after stopping it. Men are advised not to father a child while taking this medicine or for 3 months after stopping it. This medicine may make it more difficult to get pregnant or father a child. Talk with your doctor or health care professional if you are concerned about your fertility. What side effects may I notice from receiving this medicine? Side effects that you should report to your doctor or health care professional as soon as possible: -allergic reactions like skin rash, itching or hives, swelling of the face, lips, or tongue -low blood counts - this medicine may decrease the number of white blood cells, red blood cells and platelets. You may be at increased risk for infections and bleeding. -signs of infection - fever or chills, cough, sore throat, pain or difficulty passing urine -signs of decreased platelets or bleeding - bruising, pinpoint red spots on the skin, black, tarry stools, blood in the urine -signs of decreased red blood cells - unusually weak or tired, fainting spells, lightheadedness -breathing problems -changes in vision -chest pain -dark urine -diarrhea of more than 4 bowel movements in one day or any diarrhea at night; bloody or watery diarrhea -dizziness -mouth sores -nausea and vomiting -pain, tingling, numbness in the  hands or feet -redness, swelling, or sores on hands or feet -stomach pain -vomiting -yellow color of skin or eyes Side effects that usually do not require medical attention (report to your doctor or health care professional if they continue or are bothersome): -constipation -diarrhea -dry or itchy skin -hair loss -loss of appetite -nausea -weak or tired This list may not describe all possible side effects. Call your doctor for medical advice about side effects. You may report side effects to FDA at 1-800-FDA-1088. Where should I keep my medicine? Keep out of the reach of children. Store at room temperature between 15 and 30 degrees C (59 and 86 degrees F). Keep container tightly closed. Throw away any  unused medicine after the expiration date. NOTE: This sheet is a summary. It may not cover all possible information. If you have questions about this medicine, talk to your doctor, pharmacist, or health care provider.  2018 [employer] Standard (2015-01-30 13:11:21) Leucovorin tablets What is this medicine? LEUCOVORIN (loo koe VOR in) is used to prevent or to treat the harmful effects of some medicines. This medicine may be used for other purposes; ask your health care provider or pharmacist if you have questions. What should I tell my health care provider before I take this medicine? They need to know if you have any of these conditions: -anemia from low levels of vitamin B-12 in the blood -frequent vomiting and diarrhea -an unusual or allergic reaction to leucovorin, folic acid, other medicines, foods, dyes, or preservatives -pregnant or trying to get pregnant -breast-feeding How should I use this medicine? Take this medicine by mouth with a glass of water. Follow the directions on the prescription label. Take your doses at regular intervals. Do not take your medicine more often than directed. It is very important to take this medicine exactly how it is prescribed. Do not stop taking  except on your doctor's advice even if you feel better. Talk to your pediatrician regarding the use of this medicine in children. Special care may be needed. Overdosage: If you think you have taken too much of this medicine contact a poison control center or emergency room at once. NOTE: This medicine is only for you. Do not share this medicine with others. What if I miss a dose? If you miss a dose, take it as soon as you can. If it is almost time for your next dose, take only that dose. Do not take double or extra doses. What may interact with this medicine? -capecitabine -fluorouracil -phenobarbital -phenytoin -primidone -trimethoprim-sulfamethoxazole This list may not describe all possible interactions. Give your health care provider a list of all the medicines, herbs, non-prescription drugs, or dietary supplements you use. Also tell them if you smoke, drink alcohol, or use illegal drugs. Some items may interact with your medicine. What should I watch for while using this medicine? Your condition will be monitored carefully while you are receiving this medicine. You will need important blood work done while you are taking this medicine. Tell your doctor if you have nausea or vomiting regularly. What side effects may I notice from receiving this medicine? Side effects that you should report to your doctor or health care professional as soon as possible: -allergic reactions like skin rash, itching or hives, swelling of the face, lips, or tongue -breathing problems This list may not describe all possible side effects. Call your doctor for medical advice about side effects. You may report side effects to FDA at 1-800-FDA-1088. Where should I keep my medicine? Keep out of the reach of children. Store at room temperature between 15 and 25 degrees C (59 and 77 degrees F). Protect from light and moisture. Throw away any unused medicine after the expiration date. NOTE: This sheet is a summary. It  may not cover all possible information. If you have questions about this medicine, talk to your doctor, pharmacist, or health care provider.  2018 [employer] Standard (2015-02-14 13:02:30)

## 2016-09-03 NOTE — Progress Notes (Signed)
[facility] day:  09/03/2016  Chief Complaint: Tracy Silva is a 76 y.o. female with stage I rectal carcinoma and stage IIIB adenocarcinoma of the descending colon who is referred in consultation by Dr. Hervey Ard for assessment and management.  HPI:  The patient presented on 07/14/2016 with rectal bleeding x 2 months.  Rectal exam revealed a 1.1 cm friable polypoid mass protruding from the anus at the 3 o'clock position.  Anoscopy showed no visual abnormality of the lower rectal mucosa. The inflammatory mass appeared to be at the level of the dentate line. Biopsy revealed low grade adenocarcinoma.  CEA was 1.7 on 07/20/2016.  Abdomen and pelvic CT scan on 07/22/2016 revealed circumferential thickening within the mid descending colon highly suspicious for underlying neoplasm.  There was no evidence of metastatic disease.  She underwent colonoscopy with transanal excision of a low rectal adenocarcinoma and biopsy of the mid descending colon mass on 07/27/2016 by Dr. Bary Castilla.  The rectal mass was palpable at the 2:00 position. The mass was 2 cm, sessile, non-obstructing, and non-circumferential.  Pathology revealed a 1.8 cm invasive moderately differentiated adenocarcinoma in the rectum.  Tumor invaded the submucosa.  All margins were uninvolved by invasive carcinoma, high-grade dysplasia, intramucosal adenocarcinoma, and adenoma.  Distance from the radial margin was 6 mm.  There was no lymphovascular invasion or perineural invasion.  Pathologic stage was T1Nx.  Colonoscopy revealed an infiltrative partially obstructing 4 cm mass in the descending colon and in the mid descending colon. The mass was non-circumferential.  Pathology revealed an adenocarcinoma in the mid descending colon.    She underwent laparoscopically assisted left hemicolectomy with primary anastomosis on 08/21/2016.  Pathology revealed a 3.0 cm moderately differentiated adenocarcinoma  which invaded through the muscularis propria into the pericolic tissue. All margins were negative for invasive carcinoma. One of 12 lymph nodes were positive. MMR was intact.  Pathologic stage was T3N1a.  Symptomatically, she denies any complaints.  She is healing well from surgery.   Past Medical History:  Diagnosis Date  . Acid reflux   . Anemia    H/O AS A CHILD  . Arthritis    FINGERS AND SHOULDER-RIGHT  . Asthma    AS A CHILD  . Diabetes mellitus without complication ([place])   . Heart murmur    ASYMPTOMATIC  . High cholesterol   . Hypertension   . Hypothyroidism   . Other and unspecified noninfectious gastroenteritis and colitis(558.9) 06/27/2012  . Rectal bleeding 2011  . Rectal cancer ([place]) 07/16/2016    Past Surgical History:  Procedure Laterality Date  . COLONOSCOPY  2011   X2  . COLONOSCOPY N/A 07/27/2016   Procedure: COLONOSCOPY IN O.R;  Surgeon: Robert Bellow, MD;  Location: [facility] ORS;  Service: General;  Laterality: N/A;  . IUD REMOVAL    . LAPAROSCOPIC PARTIAL COLECTOMY Left 08/21/2016   Procedure: LAPAROSCOPIC ASSISTED LEFT HEMI-COLECTOMY;  Surgeon: Robert Bellow, MD;  Location: [facility] ORS;  Service: General;  Laterality: Left;  . RECTAL EXAM UNDER ANESTHESIA N/A 07/27/2016   Procedure: RECTAL EXAM UNDER ANESTHESIA;  Surgeon: Robert Bellow, MD;  Location: [facility] ORS;  Service: General;  Laterality: N/A;  . TONSILLECTOMY    . TOOTH EXTRACTION    . TRANSANAL EXCISION OF RECTAL MASS  07/27/2016   Procedure: EXCISION OF RECTAL MASS;  Surgeon: Robert Bellow, MD;  Location: [facility] ORS;  Service: General;;  . WISDOM TOOTH EXTRACTION  Family History  Problem Relation Age of Onset  . Pancreatic cancer Mother   . Breast cancer Paternal Aunt   . Cancer Maternal Aunt     Social History:  reports that she quit smoking about 45 years ago. She has a 2.00 pack-year smoking history. She has never used smokeless tobacco. She reports that she drinks alcohol. She  reports that she does not use drugs.  She lives with her husband, Tracy Silva, in [place].  He had colorectal cancer years ago.  She describes his treatment as "not pleasant".  The patient is accompanied by her daughter, Tracy Silva, today.  Allergies:  Allergies  Allergen Reactions  . Adhesive [Tape]     FOR  LONG PERIODS OF TIME  . Codeine Other (See Comments)    Headaches Heart palpations  . Statins     Muscle pain, joint pain    Current Medications: Current Outpatient Prescriptions  Medication Sig Dispense Refill  . amLODipine-benazepril (LOTREL) 5-10 MG capsule TAKE 1 CAPSULE BY MOUTH DAILY FOR BLOOD PRESSURE (Patient taking differently: TAKE 1 CAPSULE BY MOUTH DAILY FOR BLOOD PRESSURE-AM) 30 capsule 12  . ezetimibe (ZETIA) 10 MG tablet Take 1 tablet (10 mg total) by mouth every morning. 30 tablet 12  . glipiZIDE (GLUCOTROL) 5 MG tablet Take 5 mg by mouth daily before breakfast.    . hydrochlorothiazide (MICROZIDE) 12.5 MG capsule TAKE ONE CAPSULE EVERYDAY FOR BLOOD PRESSURE/ FLUID 90 capsule 3  . levothyroxine (SYNTHROID, LEVOTHROID) 100 MCG tablet TAKE 1 TABLET EVERY DAY ON AN EMPTY STOMACH WITH A GLASS OF WATER AT LEAST 30-60 MINUTES BEFORE BREAKFAST FOR THYROID 90 tablet 3  . metoprolol succinate (TOPROL-XL) 100 MG 24 hr tablet Take 1 tablet (100 mg total) by mouth daily. Take with or immediately following a meal. 14 tablet 0  . Multiple Vitamins-Minerals (CENTRUM SILVER ADULT 50+ PO) Take 1 tablet by mouth daily.    Marland Kitchen omeprazole (PRILOSEC OTC) 20 MG tablet Take 20 mg by mouth daily.    . pioglitazone (ACTOS) 45 MG tablet Take 1 tablet (45 mg total) by mouth daily. 30 tablet 3  . HYDROcodone-acetaminophen (NORCO) 7.5-325 MG tablet Take 1 tablet by mouth every 4 (four) hours as needed for moderate pain. (Patient not taking: Reported on 09/03/2016) 30 tablet 0   No current facility-administered medications for this visit.     Review of Systems:  GENERAL:  Feels good.  No fevers,  sweats or weight loss. PERFORMANCE STATUS (ECOG): 1 HEENT:  No visual changes, runny nose, sore throat, mouth sores or tenderness. Lungs: No shortness of breath or cough.  No hemoptysis. Cardiac:  No chest pain, palpitations, orthopnea, or PND. GI:  No nausea, vomiting, diarrhea, constipation, melena or hematochezia. GU:  No urgency, frequency, dysuria, or hematuria. Musculoskeletal:  No back pain.  No joint pain.  No muscle tenderness. Extremities:  No pain or swelling. Skin:  No rashes or skin changes. Neuro:  No headache, numbness or weakness, balance or coordination issues. Endocrine:  Diabetes.  On Synthroid for hypothyroidism.  No hot flashes or night sweats.  Cold sensitivity. Psych:  No mood changes, depression or anxiety. Pain:  No focal pain. Review of systems:  All other systems reviewed and found to be negative.  Physical Exam: Blood pressure 116/73, pulse 93, temperature (!) 97.4 F (36.3 C), temperature source Tympanic, resp. rate 18, weight 164 lb 7 oz (74.6 kg). GENERAL:  Well developed, well nourished, woman sitting comfortably in the exam room in no acute distress.  She is tearful at times. MENTAL STATUS:  Alert and oriented to person, place and time. HEAD:  Pearline Cables hair.  Normocephalic, atraumatic, face symmetric, no Cushingoid features. EYES:  Hazel eyes.  Pupils equal round and reactive to light and accomodation.  No conjunctivitis or scleral icterus. ENT:  Oropharynx clear without lesion.  Tongue normal. Mucous membranes moist.  RESPIRATORY:  Clear to auscultation without rales, wheezes or rhonchi. CARDIOVASCULAR:  Regular rate and rhythm with a III/VI systolic murmur.  No rub or gallop. ABDOMEN:  Well healed laparoscopic incisions.  Soft, non-tender, with active bowel sounds, and no hepatosplenomegaly.  No masses. SKIN:  No rashes, ulcers or lesions. EXTREMITIES: Chronic lower extremity changes.  No skin discoloration or tenderness.  No palpable cords. LYMPH NODES: No  palpable cervical, supraclavicular, axillary or inguinal adenopathy  NEUROLOGICAL: Unremarkable. PSYCH:  Appropriate.   Office Visit on 09/01/2016  Component Date Value Ref Range Status  . POC Glucose 09/01/2016 282* 70 - 99 mg/dl Final    Assessment:  CHRISTMAS FARACI is a 76 y.o. 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  She presented with a 2 month history of rectal bleeding.  She underwent colonoscopy with transanal excision of a low rectal adenocarcinoma on 07/27/2016.  Pathology revealed a 1.8 cm invasive moderately differentiated adenocarcinoma in the rectum.  Tumor invaded the submucosa.  All margins were uninvolved by invasive carcinoma, high-grade dysplasia, intramucosal adenocarcinoma, and adenoma.  Distance from the radial margin was 6 mm.  There was no lymphovascular invasion or perineural invasion.  Pathologic stage was T1Nx.  She underwent laparoscopically assisted left hemicolectomy with primary anastomosis on 08/21/2016.  Pathology revealed a 3.0 cm moderately differentiated adenocarcinoma which invaded through the muscularis propria into the pericolic tissue. All margins were negative for invasive carcinoma. There was no lymphovascular invasion or perineural invasion. One of 12 lymph nodes were positive. MMR was intact.  Pathologic stage was T3N1a.  Abdomen and pelvic CT on 07/22/2016 revealed circumferential thickening within the mid descending colon highly suspicious for underlying neoplasm.  There was no evidence of metastatic disease.  Symptomatically, she denies any complaint.  Exam reveals well healing laparoscopic incisions.  Hematocrit was 27.0 on 08/25/2016.  Plan: 1.  Discuss diagnosis, staging and management of rectal cancer.  Pathology reveals a T1 lesion.  There appears to be no high risk features (positive margin, lymphovascular invasion, poorly differentiated).  Discuss with pathology the invasion of submucosa  (sm3- invasion to lower third is a high risk feature).  Discuss pelvic MRI to assess nodal status.  Suspect nodal status will be negative.  2.  Discuss diagnosis, staging, and management of colon cancer.  Patient has stage IIIB disease.  Discuss benefit of adjuvant chemotherapy.  There were no high risk features (poorly differentiated, lymphovascular invasion, perineural invasion, perforation, obstruction or close margins).  We discussed the chemotherapy backbone of 5FU and leucovorin with consideration of the addition of oxaliplatin.  We discussed the approximately 5% benfit of the addition of oxaliplatin based on NSABP C07 and MOSAIC trials.  We discussed the use of oxaliplatin in the > 70 population.  We discussed the need for a port-a-cath for IV chemotherapy.  We discussed oral Xeloda (2 weeks on/1 week off).  Side effects of chemotherapy were reviewed in detail.  We discussed the new data for 3 months versus 6 months of chemotherapy for low risk patients (CAPEOX or FOLFOX x 3 months; 5FU/LV or Xeloda x 6 months).  We discussed the use of Xeloda if CrCl > 30 ml/min.  Multiple questions were asked and answered.  Patient became tearful during our discussion.  She was unsure if she would consider an infusion pump.  She states that it was hard for her and her husband during his treatment for colorectal cancer years ago.  She would consider "the pill" (Xeloda).  She stated that she would go to chemotherapy class to learn more about treatment.  3.  Chemotherapy class:  FOLFOX versus Xeloda + oxaliplatin.  She is considering Xeloda. 4.  Labs today:  CBC with diff, BMP, ferritin, iron studies. 5.  Pelvic MRI on 09/22/2016. 6.  RTC after MRI for review of imaging and finalization of treatment plan.   Lequita Asal, MD  09/03/2016, 9:45 AM

## 2016-09-07 ENCOUNTER — Telehealth: Payer: Self-pay

## 2016-09-07 LAB — SURGICAL PATHOLOGY

## 2016-09-07 NOTE — Telephone Encounter (Signed)
  Oncology Nurse Navigator Documentation Received call from Ms. Tracy Silva. At this time she would like to cancel all her upcoming appointments. Chemo class, MRI, and follow up. She voices that she  understands the risk she is taking by not moving forward with chemotherapy. She states she "needs more time to process what she will be doing to her body." She is aware that chemotherapy would need to be initiated within 8 weeks of her surgery.She is also requesting that her daughter, [name], be removed from her contacts. She will call back before the end of September. Navigator Location: [facility] (09/07/16 1300)   )Navigator Encounter Type: Telephone (09/07/16 1300) Telephone: Incoming Call (09/07/16 1300)                                                  Time Spent with Patient: 15 (09/07/16 1300)

## 2016-09-08 ENCOUNTER — Inpatient Hospital Stay: Payer: Medicare Other

## 2016-09-10 ENCOUNTER — Other Ambulatory Visit: Payer: Self-pay | Admitting: Family Medicine

## 2016-09-15 ENCOUNTER — Encounter: Payer: Self-pay | Admitting: General Surgery

## 2016-09-15 ENCOUNTER — Ambulatory Visit: Payer: Medicare Other

## 2016-09-15 ENCOUNTER — Ambulatory Visit (INDEPENDENT_AMBULATORY_CARE_PROVIDER_SITE_OTHER): Payer: Medicare Other | Admitting: General Surgery

## 2016-09-15 VITALS — BP 118/58 | HR 90 | Resp 14 | Ht 64.0 in | Wt 161.0 lb

## 2016-09-15 DIAGNOSIS — C186 Malignant neoplasm of descending colon: Secondary | ICD-10-CM

## 2016-09-15 NOTE — Progress Notes (Signed)
Patient ID: Tracy Silva, female   DOB: [birth year], 76 y.o.   MRN: [id number]  Chief Complaint  Patient presents with  . Follow-up    HPI Tracy Silva is a 76 y.o. female here today for her follow up colectomy done on 08/21/2016. Patient states she is doing well. Bowels area formed, no complaints.The patient reports having no difficulty with bowel movements. No residual perianal pain. Stools are soft and formed. No dietary intolerance. She did see Dr Tracy Silva on 09-03-16.  HPI  Past Medical History:  Diagnosis Date  . Acid reflux   . Anemia    H/O AS A CHILD  . Arthritis    FINGERS AND SHOULDER-RIGHT  . Asthma    AS A CHILD  . Diabetes mellitus without complication ([place])   . Heart murmur    ASYMPTOMATIC  . High cholesterol   . Hypertension   . Hypothyroidism   . Other and unspecified noninfectious gastroenteritis and colitis(558.9) 06/27/2012  . Rectal bleeding 2011  . Rectal cancer ([place]) 07/16/2016    Past Surgical History:  Procedure Laterality Date  . COLONOSCOPY  2011   X2  . COLONOSCOPY N/A 07/27/2016   Procedure: COLONOSCOPY IN O.R;  Surgeon: Tracy Bellow, MD;  Location: [facility] ORS;  Service: General;  Laterality: N/A;  . IUD REMOVAL    . LAPAROSCOPIC PARTIAL COLECTOMY Left 08/21/2016   Procedure: LAPAROSCOPIC ASSISTED LEFT HEMI-COLECTOMY;  Surgeon: Tracy Bellow, MD;  Location: [facility] OR[facility];  Service: General;  Laterality: Left;  . RECTAL EXAM UNDER ANESTHESIA N/A 07/27/2016   Procedure: RECTAL EXAM UNDER ANESTHESIA;  Surgeon: Tracy Bellow, MD;  Location: [facility];  Service: General;  Laterality: N/A;  . TONSILLECTOMY    . TOOTH EXTRACTION    . TRANSANAL EXCISION OF RECTAL MASS  07/27/2016   Procedure: EXCISION OF RECTAL MASS;  Surgeon: Tracy Bellow, MD;  Location: [facility] ORS;  Service: General;;  . WISDOM TOOTH EXTRACTION      Family History  Problem Relation Age of Onset  . Pancreatic cancer Mother   . Breast cancer Paternal Aunt   . Cancer  Maternal Aunt     Social History Social History  Substance Use Topics  . Smoking status: Former Smoker    Packs/day: 0.50    Years: 4.00    Quit date: 01/26/1971  . Smokeless tobacco: Never Used  . Alcohol use Yes     Comment: occasionally with dinner    Allergies  Allergen Reactions  . Adhesive [Tape]     FOR  LONG PERIODS OF TIME  . Codeine Other (See Comments)    Headaches Heart palpations  . Statins     Muscle pain, joint pain    Current Outpatient Prescriptions  Medication Sig Dispense Refill  . amLODipine-benazepril (LOTREL) 5-10 MG capsule TAKE 1 CAPSULE BY MOUTH DAILY FOR BLOOD PRESSURE (Patient taking differently: TAKE 1 CAPSULE BY MOUTH DAILY FOR BLOOD PRESSURE-AM) 30 capsule 12  . ezetimibe (ZETIA) 10 MG tablet Take 1 tablet (10 mg total) by mouth every morning. 30 tablet 12  . glipiZIDE (GLUCOTROL) 5 MG tablet Take 5 mg by mouth daily before breakfast.    . hydrochlorothiazide (MICROZIDE) 12.5 MG capsule TAKE ONE CAPSULE EVERYDAY FOR BLOOD PRESSURE/ FLUID 90 capsule 3  . HYDROcodone-acetaminophen (NORCO) 7.5-325 MG tablet Take 1 tablet by mouth every 4 (four) hours as needed for moderate pain. 30 tablet 0  . levothyroxine (SYNTHROID, LEVOTHROID) 100 MCG tablet TAKE 1 TABLET EVERY DAY ON AN  EMPTY STOMACH WITH A GLASS OF WATER AT LEAST 30-60 MINUTES BEFORE BREAKFAST FOR THYROID 90 tablet 3  . metoprolol succinate (TOPROL-XL) 100 MG 24 hr tablet TAKE 1 TABLET BY MOUTH DAILY. TAKE WITH OR IMMEDIATELY FOLLOWING A MEAL. 30 tablet 11  . Multiple Vitamins-Minerals (CENTRUM SILVER ADULT 50+ PO) Take 1 tablet by mouth daily.    Marland Kitchen omeprazole (PRILOSEC OTC) 20 MG tablet Take 20 mg by mouth daily.    . pioglitazone (ACTOS) 45 MG tablet Take 1 tablet (45 mg total) by mouth daily. 30 tablet 3   No current facility-administered medications for this visit.     Review of Systems Review of Systems  Constitutional: Negative.   Respiratory: Negative.   Cardiovascular: Negative.      Blood pressure (!) 118/58, pulse 90, resp. rate 14, height 5\' 4"  (1.626 m), weight 161 lb (73 kg). The patient's weight is down 9 pounds from preop.  Physical Exam Physical Exam  Constitutional: She is oriented to person, place, and time. She appears well-developed and well-nourished.  Abdominal: Soft. Normal appearance. There is no tenderness.  Abdominal incision clean  Neurological: She is alert and oriented to person, place, and time.  Skin: Skin is warm and dry.  Psychiatric: Her behavior is normal.    Data Reviewed Medical oncology report of 09/03/2016. Recommendations for adjuvant chemotherapy based on positive nodal status.  Assessment    Good recovery status post left colectomy.    Plan    At this time, the patient reports that after careful consideration she has decided not to proceed with adjuvant chemotherapy.  She was told that she can reassess this decision at any time.    Follow up in 3 months, earlier if she has any difficulties with her perineum or change in bowel habit.  HPI, Physical Exam, Assessment and Plan have been scribed under the direction and in the presence of Tracy Bellow, MD. Karie Fetch, RN   Tracy Silva 09/16/2016, 7:03 AM

## 2016-09-15 NOTE — Patient Instructions (Signed)
The patient is aware to call back for any questions or concerns.  

## 2016-09-16 ENCOUNTER — Ambulatory Visit: Payer: Medicare Other

## 2016-09-17 ENCOUNTER — Ambulatory Visit: Payer: Medicare Other | Admitting: Hematology and Oncology

## 2016-09-21 ENCOUNTER — Ambulatory Visit (INDEPENDENT_AMBULATORY_CARE_PROVIDER_SITE_OTHER): Payer: Medicare Other | Admitting: Family Medicine

## 2016-09-21 VITALS — BP 96/52 | HR 82 | Temp 98.3°F | Resp 14 | Wt 163.0 lb

## 2016-09-21 DIAGNOSIS — Z789 Other specified health status: Secondary | ICD-10-CM

## 2016-09-21 DIAGNOSIS — E784 Other hyperlipidemia: Secondary | ICD-10-CM | POA: Diagnosis not present

## 2016-09-21 DIAGNOSIS — E119 Type 2 diabetes mellitus without complications: Secondary | ICD-10-CM

## 2016-09-21 DIAGNOSIS — Z2821 Immunization not carried out because of patient refusal: Secondary | ICD-10-CM

## 2016-09-21 DIAGNOSIS — E039 Hypothyroidism, unspecified: Secondary | ICD-10-CM

## 2016-09-21 DIAGNOSIS — I10 Essential (primary) hypertension: Secondary | ICD-10-CM

## 2016-09-21 DIAGNOSIS — E7849 Other hyperlipidemia: Secondary | ICD-10-CM

## 2016-09-21 MED ORDER — METOPROLOL SUCCINATE ER 50 MG PO TB24: 50.0000 mg | ORAL_TABLET | Freq: Every day | ORAL | 12 refills | Status: DC

## 2016-09-21 NOTE — Progress Notes (Signed)
Tracy Silva  MRN: [id number] DOB: 1940/10/25  Subjective:  HPI  Patient is here for 1 month follow up on HTN and Hyperlipidemia. On last office visit 08/18/16 patient was started on Zetia (she is statin intolerant). Patient is tolerating medication well, no side effect. Needs labs checked today. B/P has been on hypotensive side and we were to follow up today on this. Patient is not checking her b/p at home. Lab Results  Component Value Date   CHOL 319 (H) 10/11/2015   HDL 40 10/11/2015   LDLCALC Comment 10/11/2015   TRIG 484 (H) 10/11/2015   BP Readings from Last 3 Encounters:  09/21/16 (!) 96/52  09/15/16 (!) 118/58  09/03/16 116/73   Wt Readings from Last 3 Encounters:  09/21/16 163 lb (73.9 kg)  09/15/16 161 lb (73 kg)  09/03/16 164 lb 7 oz (74.6 kg)    Patient Active Problem List   Diagnosis Date Noted  . Colon cancer ([place]) 08/21/2016  . Rectal cancer ([place]) 07/30/2016  . Cancer of descending colon ([place]) 07/30/2016  . LBBB (left bundle branch block) 07/24/2016  . Heart murmur, systolic 90/24/0973  . Acid reflux 09/07/2014  . AI (aortic incompetence) 09/07/2014  . Allergic rhinitis 09/07/2014  . Clinical depression 09/07/2014  . Diabetes mellitus type 2 in obese ([place]) 09/07/2014  . Essential (primary) hypertension 09/07/2014  . H/O thyroid disease 09/07/2014  . HLD (hyperlipidemia) 09/07/2014  . Acquired hypothyroidism 09/07/2014  . Adiposity 09/07/2014  . Post menopausal syndrome 09/07/2014  . Basal cell papilloma 09/07/2014  . Avitaminosis D 09/07/2014    Past Medical History:  Diagnosis Date  . Acid reflux   . Anemia    H/O AS A CHILD  . Arthritis    FINGERS AND SHOULDER-RIGHT  . Asthma    AS A CHILD  . Diabetes mellitus without complication ([place])   . Heart murmur    ASYMPTOMATIC  . High cholesterol   . Hypertension   . Hypothyroidism   . Other and unspecified noninfectious gastroenteritis and colitis(558.9) 06/27/2012  . Rectal bleeding 2011    . Rectal cancer ([place]) 07/16/2016    Social History   Social History  . Marital status: Married    Spouse name: N/A  . Number of children: N/A  . Years of education: N/A   Occupational History  . Not on file.   Social History Main Topics  . Smoking status: Former Smoker    Packs/day: 0.50    Years: 4.00    Quit date: 01/26/1971  . Smokeless tobacco: Never Used  . Alcohol use Yes     Comment: occasionally with dinner  . Drug use: No  . Sexual activity: Not on file   Other Topics Concern  . Not on file   Social History Narrative  . No narrative on file    Outpatient Encounter Prescriptions as of 09/21/2016  Medication Sig  . amLODipine-benazepril (LOTREL) 5-10 MG capsule TAKE 1 CAPSULE BY MOUTH DAILY FOR BLOOD PRESSURE (Patient taking differently: TAKE 1 CAPSULE BY MOUTH DAILY FOR BLOOD PRESSURE-AM)  . Ascorbic Acid (VITAMIN C) 100 MG tablet Take 100 mg by mouth daily.  Marland Kitchen ezetimibe (ZETIA) 10 MG tablet Take 1 tablet (10 mg total) by mouth every morning.  Marland Kitchen glipiZIDE (GLUCOTROL) 5 MG tablet Take 5 mg by mouth daily before breakfast.  . hydrochlorothiazide (MICROZIDE) 12.5 MG capsule TAKE ONE CAPSULE EVERYDAY FOR BLOOD PRESSURE/ FLUID  . levothyroxine (SYNTHROID, LEVOTHROID) 100 MCG tablet TAKE 1 TABLET EVERY DAY  ON AN EMPTY STOMACH WITH A GLASS OF WATER AT LEAST 30-60 MINUTES BEFORE BREAKFAST FOR THYROID  . metoprolol succinate (TOPROL-XL) 100 MG 24 hr tablet TAKE 1 TABLET BY MOUTH DAILY. TAKE WITH OR IMMEDIATELY FOLLOWING A MEAL.  . Multiple Vitamins-Calcium (ONE-A-DAY WOMENS PO) Take by mouth daily.  Marland Kitchen omeprazole (PRILOSEC OTC) 20 MG tablet Take 20 mg by mouth daily.  . pioglitazone (ACTOS) 45 MG tablet Take 1 tablet (45 mg total) by mouth daily.  . Probiotic Product (PROBIOTIC PEARLS PO) Take by mouth daily.  . [DISCONTINUED] HYDROcodone-acetaminophen (NORCO) 7.5-325 MG tablet Take 1 tablet by mouth every 4 (four) hours as needed for moderate pain.  . [DISCONTINUED]  Multiple Vitamins-Minerals (CENTRUM SILVER ADULT 50+ PO) Take 1 tablet by mouth daily.   No facility-administered encounter medications on file as of 09/21/2016.     Allergies  Allergen Reactions  . Adhesive [Tape]     FOR  LONG PERIODS OF TIME  . Codeine Other (See Comments)    Headaches Heart palpations  . Statins     Muscle pain, joint pain    Review of Systems  Constitutional: Negative.   HENT: Negative.   Eyes: Negative.   Respiratory: Negative.   Cardiovascular: Negative.   Musculoskeletal: Negative.   Skin: Negative.   Neurological: Negative.   Endo/Heme/Allergies: Negative.   Psychiatric/Behavioral: Negative.     Objective:  BP (!) 96/52   Pulse 82   Temp 98.3 F (36.8 C)   Resp 14   Wt 163 lb (73.9 kg)   BMI 27.98 kg/m   Physical Exam  Constitutional: She is oriented to person, place, and time and well-developed, well-nourished, and in no distress.  HENT:  Head: Normocephalic and atraumatic.  Right Ear: External ear normal.  Left Ear: External ear normal.  Nose: Nose normal.  Eyes: Pupils are equal, round, and reactive to light. Conjunctivae are normal.  Neck: Normal range of motion. Neck supple.  Cardiovascular: Normal rate, regular rhythm and intact distal pulses.   Murmur heard.  Systolic murmur is present with a grade of 2/6  Pulmonary/Chest: Effort normal and breath sounds normal. No respiratory distress. She has no wheezes.  Abdominal: Soft.  Musculoskeletal: She exhibits no edema or tenderness.  Neurological: She is alert and oriented to person, place, and time.  Skin: Skin is warm and dry.  Psychiatric: Mood, memory, affect and judgment normal.    Assessment and Plan :  1. Essential hypertension Hypotensive, patient is asymptomatic. Decrease Metoprlol to 50 mg daily. Follow up in 2 months. - CBC with Differential/Platelet - metoprolol succinate (TOPROL-XL) 50 MG 24 hr tablet; Take 1 tablet (50 mg total) by mouth daily. Take with or  immediately following a meal.  Dispense: 30 tablet; Refill: 12  2. Other hyperlipidemia Check labs since patient has started Zetia. Tolerating medication. - Lipid Panel With LDL/HDL Ratio - Comprehensive metabolic panel  3. Statin intolerance 4. Type 2 diabetes mellitus without complication, without long-term current use of insulin ([place]) 5. Influenza vaccination declined by patient  6. Acquired hypothyroidism - TSH 7.Recent partial colectomy for colon cancer HPI, Exam and A&P transcribed by Theressa Millard, RMA under direction and in the presence of Miguel Aschoff, MD. I have done the exam and reviewed the chart and it is accurate to the best of my knowledge. Development worker, community has been used and  any errors in dictation or transcription are unintentional. Miguel Aschoff M.D. [facility]

## 2016-09-22 ENCOUNTER — Ambulatory Visit: Payer: Medicare Other

## 2016-09-24 ENCOUNTER — Ambulatory Visit: Payer: Medicare Other | Admitting: Hematology and Oncology

## 2016-09-24 DIAGNOSIS — E039 Hypothyroidism, unspecified: Secondary | ICD-10-CM | POA: Diagnosis not present

## 2016-09-24 DIAGNOSIS — E784 Other hyperlipidemia: Secondary | ICD-10-CM | POA: Diagnosis not present

## 2016-09-24 DIAGNOSIS — I10 Essential (primary) hypertension: Secondary | ICD-10-CM | POA: Diagnosis not present

## 2016-09-25 LAB — TSH: TSH: 0.566 u[IU]/mL (ref 0.450–4.500)

## 2016-09-25 LAB — CBC WITH DIFFERENTIAL/PLATELET
BASOS ABS: 0 10*3/uL (ref 0.0–0.2)
Basos: 0 %
EOS (ABSOLUTE): 0.3 10*3/uL (ref 0.0–0.4)
Eos: 6 %
HEMOGLOBIN: 10.1 g/dL — AB (ref 11.1–15.9)
Hematocrit: 32.4 % — ABNORMAL LOW (ref 34.0–46.6)
IMMATURE GRANS (ABS): 0 10*3/uL (ref 0.0–0.1)
Immature Granulocytes: 0 %
LYMPHS: 35 %
Lymphocytes Absolute: 1.6 10*3/uL (ref 0.7–3.1)
MCH: 30.9 pg (ref 26.6–33.0)
MCHC: 31.2 g/dL — ABNORMAL LOW (ref 31.5–35.7)
MCV: 99 fL — ABNORMAL HIGH (ref 79–97)
MONOCYTES: 10 %
Monocytes Absolute: 0.5 10*3/uL (ref 0.1–0.9)
NEUTROS PCT: 49 %
Neutrophils Absolute: 2.3 10*3/uL (ref 1.4–7.0)
Platelets: 247 10*3/uL (ref 150–379)
RBC: 3.27 x10E6/uL — AB (ref 3.77–5.28)
RDW: 14.3 % (ref 12.3–15.4)
WBC: 4.7 10*3/uL (ref 3.4–10.8)

## 2016-09-25 LAB — COMPREHENSIVE METABOLIC PANEL
ALK PHOS: 63 IU/L (ref 39–117)
ALT: 12 IU/L (ref 0–32)
AST: 19 IU/L (ref 0–40)
Albumin/Globulin Ratio: 1.4 (ref 1.2–2.2)
Albumin: 4.1 g/dL (ref 3.5–4.8)
BUN/Creatinine Ratio: 19 (ref 12–28)
BUN: 31 mg/dL — ABNORMAL HIGH (ref 8–27)
Bilirubin Total: 0.2 mg/dL (ref 0.0–1.2)
CALCIUM: 9.9 mg/dL (ref 8.7–10.3)
CO2: 23 mmol/L (ref 20–29)
CREATININE: 1.61 mg/dL — AB (ref 0.57–1.00)
Chloride: 102 mmol/L (ref 96–106)
GFR calc Af Amer: 35 mL/min/{1.73_m2} — ABNORMAL LOW (ref >59)
GFR, EST NON AFRICAN AMERICAN: 30 mL/min/{1.73_m2} — AB (ref >59)
Globulin, Total: 2.9 g/dL (ref 1.5–4.5)
Glucose: 136 mg/dL — ABNORMAL HIGH (ref 65–99)
Potassium: 4.1 mmol/L (ref 3.5–5.2)
Sodium: 142 mmol/L (ref 134–144)
Total Protein: 7 g/dL (ref 6.0–8.5)

## 2016-09-25 LAB — LIPID PANEL WITH LDL/HDL RATIO
CHOLESTEROL TOTAL: 241 mg/dL — AB (ref 100–199)
HDL: 45 mg/dL (ref >39)
LDL CALC: 132 mg/dL — AB (ref 0–99)
LDl/HDL Ratio: 2.9 ratio (ref 0.0–3.2)
TRIGLYCERIDES: 321 mg/dL — AB (ref 0–149)
VLDL CHOLESTEROL CAL: 64 mg/dL — AB (ref 5–40)

## 2016-10-26 ENCOUNTER — Other Ambulatory Visit: Payer: Self-pay | Admitting: Family Medicine

## 2016-10-26 DIAGNOSIS — E119 Type 2 diabetes mellitus without complications: Secondary | ICD-10-CM

## 2016-11-17 ENCOUNTER — Ambulatory Visit: Payer: Medicare Other

## 2016-11-25 ENCOUNTER — Ambulatory Visit (INDEPENDENT_AMBULATORY_CARE_PROVIDER_SITE_OTHER): Payer: Medicare Other | Admitting: Family Medicine

## 2016-11-25 VITALS — BP 124/58 | HR 80 | Temp 98.0°F | Resp 16 | Wt 166.0 lb

## 2016-11-25 DIAGNOSIS — D649 Anemia, unspecified: Secondary | ICD-10-CM

## 2016-11-25 DIAGNOSIS — I10 Essential (primary) hypertension: Secondary | ICD-10-CM

## 2016-11-25 DIAGNOSIS — Z23 Encounter for immunization: Secondary | ICD-10-CM | POA: Diagnosis not present

## 2016-11-25 NOTE — Progress Notes (Signed)
Tracy Silva  MRN: [id number] DOB: 04-30-1940  Subjective:  HPI   The patient is a 76 year old female who presents for follow up of her hypertension.  She was last seen on 09/21/16.  At that time her blood pressure was low at 96/52 and she was instructed to decrease her Metoprolol and follow up today. She has not been checking her blood pressure outside of the office.  She states she feel well.  The patient states she had been taking Probiotic for her immune system.  She developed diarrhea while taking and has discontinued them.  She would like to have something to boost her immune system.  She states she takes Vitamin C and was told by a friend that one could not take too much Vitamin C.  She would like to know what you recommend   BP Readings from Last 3 Encounters:  11/25/16 (!) 124/58  09/21/16 (!) 96/52  09/15/16 (!) 118/58    Patient Active Problem List   Diagnosis Date Noted  . Colon cancer ([place]) 08/21/2016  . Rectal cancer ([place]) 07/30/2016  . Cancer of descending colon ([place]) 07/30/2016  . LBBB (left bundle branch block) 07/24/2016  . Heart murmur, systolic 29/52/8413  . Acid reflux 09/07/2014  . AI (aortic incompetence) 09/07/2014  . Allergic rhinitis 09/07/2014  . Clinical depression 09/07/2014  . Diabetes mellitus type 2 in obese ([place]) 09/07/2014  . Essential (primary) hypertension 09/07/2014  . H/O thyroid disease 09/07/2014  . HLD (hyperlipidemia) 09/07/2014  . Acquired hypothyroidism 09/07/2014  . Adiposity 09/07/2014  . Post menopausal syndrome 09/07/2014  . Basal cell papilloma 09/07/2014  . Avitaminosis D 09/07/2014    Past Medical History:  Diagnosis Date  . Acid reflux   . Anemia    H/O AS A CHILD  . Arthritis    FINGERS AND SHOULDER-RIGHT  . Asthma    AS A CHILD  . Diabetes mellitus without complication ([place])   . Heart murmur    ASYMPTOMATIC  . High cholesterol   . Hypertension   . Hypothyroidism   . Other and unspecified noninfectious  gastroenteritis and colitis(558.9) 06/27/2012  . Rectal bleeding 2011  . Rectal cancer ([place]) 07/16/2016    Social History   Social History  . Marital status: Married    Spouse name: N/A  . Number of children: N/A  . Years of education: N/A   Occupational History  . Not on file.   Social History Main Topics  . Smoking status: Former Smoker    Packs/day: 0.50    Years: 4.00    Quit date: 01/26/1971  . Smokeless tobacco: Never Used  . Alcohol use Yes     Comment: occasionally with dinner  . Drug use: No  . Sexual activity: Not on file   Other Topics Concern  . Not on file   Social History Narrative  . No narrative on file    Outpatient Encounter Prescriptions as of 11/25/2016  Medication Sig  . amLODipine-benazepril (LOTREL) 5-10 MG capsule TAKE 1 CAPSULE BY MOUTH DAILY FOR BLOOD PRESSURE (Patient taking differently: TAKE 1 CAPSULE BY MOUTH DAILY FOR BLOOD PRESSURE-AM)  . Ascorbic Acid (VITAMIN C) 100 MG tablet Take 100 mg by mouth daily.  Marland Kitchen ezetimibe (ZETIA) 10 MG tablet Take 1 tablet (10 mg total) by mouth every morning.  Marland Kitchen glipiZIDE (GLUCOTROL) 5 MG tablet Take 5 mg by mouth daily before breakfast.  . hydrochlorothiazide (MICROZIDE) 12.5 MG capsule TAKE ONE CAPSULE EVERYDAY FOR BLOOD PRESSURE/  FLUID  . levothyroxine (SYNTHROID, LEVOTHROID) 100 MCG tablet TAKE 1 TABLET EVERY DAY ON AN EMPTY STOMACH WITH A GLASS OF WATER AT LEAST 30-60 MINUTES BEFORE BREAKFAST FOR THYROID  . metoprolol succinate (TOPROL-XL) 50 MG 24 hr tablet Take 1 tablet (50 mg total) by mouth daily. Take with or immediately following a meal.  . Multiple Vitamins-Calcium (ONE-A-DAY WOMENS PO) Take by mouth daily.  Marland Kitchen omeprazole (PRILOSEC OTC) 20 MG tablet Take 20 mg by mouth daily.  . pioglitazone (ACTOS) 45 MG tablet TAKE 1 TABLET BY MOUTH DAILY  . [DISCONTINUED] Probiotic Product (PROBIOTIC PEARLS PO) Take by mouth daily.   No facility-administered encounter medications on file as of 11/25/2016.      Allergies  Allergen Reactions  . Codeine Other (See Comments)    Headaches Heart palpations  . Statins     Muscle pain, joint pain  . Adhesive [Tape] Rash    FOR  LONG PERIODS OF TIME FOR  LONG PERIODS OF TIME    Review of Systems  Constitutional: Negative for fever and malaise/fatigue.  Eyes: Negative.   Respiratory: Negative for cough, shortness of breath and wheezing.   Cardiovascular: Negative for chest pain, palpitations, orthopnea, claudication and leg swelling.  Skin: Negative.   Neurological: Negative for dizziness, weakness and headaches.  Endo/Heme/Allergies: Negative.   Psychiatric/Behavioral: Negative.     Objective:  BP (!) 124/58 (BP Location: Right Arm, Patient Position: Sitting, Cuff Size: Normal)   Pulse 80   Temp 98 F (36.7 C) (Oral)   Resp 16   Wt 166 lb (75.3 kg)   BMI 28.49 kg/m   Physical Exam  Constitutional: She is oriented to person, place, and time and well-developed, well-nourished, and in no distress.  HENT:  Head: Normocephalic and atraumatic.  Right Ear: External ear normal.  Left Ear: External ear normal.  Nose: Nose normal.  Eyes: Conjunctivae are normal. Pupils are equal, round, and reactive to light. No scleral icterus.  Neck: Normal range of motion. No thyromegaly present.  Cardiovascular: Normal rate, regular rhythm and normal heart sounds.   High pitch III/VI Murmur over the RUSB  Pulmonary/Chest: Effort normal and breath sounds normal.  Abdominal: Soft.  Neurological: She is alert and oriented to person, place, and time. Gait normal. GCS score is 15.  Skin: Skin is warm and dry.  Psychiatric: Mood, memory, affect and judgment normal.    Assessment and Plan :  1. Essential (primary) hypertension   2. Need for influenza vaccination  - Flu vaccine HIGH DOSE PF (Fluzone High dose)  3. Anemia, unspecified type S/p recent colectomy. - Comprehensive metabolic panel - CBC with Differential/Platelet - Iron 4. Colon  cancer Patient has declined chemotherapy. I advised her she can always change her mind  I have done the exam and reviewed the chart and it is accurate to the best of my knowledge. Development worker, community has been used and  any errors in dictation or transcription are unintentional. Miguel Aschoff M.D. [facility]

## 2016-11-30 DIAGNOSIS — D649 Anemia, unspecified: Secondary | ICD-10-CM | POA: Diagnosis not present

## 2016-11-30 LAB — COMPLETE METABOLIC PANEL WITH GFR
AG Ratio: 1.4 (calc) (ref 1.0–2.5)
ALBUMIN MSPROF: 3.8 g/dL (ref 3.6–5.1)
ALKALINE PHOSPHATASE (APISO): 65 U/L (ref 33–130)
ALT: 12 U/L (ref 6–29)
AST: 17 U/L (ref 10–35)
BUN / CREAT RATIO: 17 (calc) (ref 6–22)
BUN: 28 mg/dL — AB (ref 7–25)
CALCIUM: 9.6 mg/dL (ref 8.6–10.4)
CO2: 29 mmol/L (ref 20–32)
CREATININE: 1.62 mg/dL — AB (ref 0.60–0.93)
Chloride: 107 mmol/L (ref 98–110)
GFR, EST AFRICAN AMERICAN: 35 mL/min/{1.73_m2} — AB (ref >=60)
GFR, EST NON AFRICAN AMERICAN: 30 mL/min/{1.73_m2} — AB (ref >=60)
GLOBULIN: 2.8 g/dL (ref 1.9–3.7)
GLUCOSE: 60 mg/dL — AB (ref 65–99)
Potassium: 3.9 mmol/L (ref 3.5–5.3)
Sodium: 141 mmol/L (ref 135–146)
TOTAL PROTEIN: 6.6 g/dL (ref 6.1–8.1)
Total Bilirubin: 0.3 mg/dL (ref 0.2–1.2)

## 2016-11-30 LAB — CBC WITH DIFFERENTIAL/PLATELET
Basophils Absolute: 30 cells/uL (ref 0–200)
Basophils Relative: 0.8 %
EOS PCT: 5 %
Eosinophils Absolute: 190 cells/uL (ref 15–500)
HEMATOCRIT: 29.7 % — AB (ref 35.0–45.0)
HEMOGLOBIN: 9.9 g/dL — AB (ref 11.7–15.5)
LYMPHS ABS: 1281 {cells}/uL (ref 850–3900)
MCH: 31.3 pg (ref 27.0–33.0)
MCHC: 33.3 g/dL (ref 32.0–36.0)
MCV: 94 fL (ref 80.0–100.0)
MPV: 9.7 fL (ref 7.5–12.5)
Monocytes Relative: 11.3 %
NEUTROS ABS: 1870 {cells}/uL (ref 1500–7800)
NEUTROS PCT: 49.2 %
Platelets: 243 10*3/uL (ref 140–400)
RBC: 3.16 10*6/uL — AB (ref 3.80–5.10)
RDW: 13 % (ref 11.0–15.0)
Total Lymphocyte: 33.7 %
WBC: 3.8 10*3/uL (ref 3.8–10.8)
WBCMIX: 429 {cells}/uL (ref 200–950)

## 2016-11-30 LAB — IRON: Iron: 61 ug/dL (ref 45–160)

## 2016-12-03 ENCOUNTER — Telehealth: Payer: Self-pay

## 2016-12-03 NOTE — Telephone Encounter (Signed)
Patient called to let Tracy Silva know that she does not have to ask Dr. Rosanna Randy about her WBC. She reports that she looked online and she knows what to do.

## 2016-12-03 NOTE — Telephone Encounter (Signed)
Noted=Tracy Silva Tracy Silva, RMA

## 2016-12-15 ENCOUNTER — Ambulatory Visit (INDEPENDENT_AMBULATORY_CARE_PROVIDER_SITE_OTHER): Payer: Medicare Other | Admitting: General Surgery

## 2016-12-15 ENCOUNTER — Encounter: Payer: Self-pay | Admitting: General Surgery

## 2016-12-15 VITALS — BP 118/64 | HR 84 | Resp 12 | Ht 63.0 in | Wt 164.0 lb

## 2016-12-15 DIAGNOSIS — C20 Malignant neoplasm of rectum: Secondary | ICD-10-CM

## 2016-12-15 DIAGNOSIS — C186 Malignant neoplasm of descending colon: Secondary | ICD-10-CM

## 2016-12-15 NOTE — Patient Instructions (Addendum)
The patient is aware to call back for any questions or concerns.  Recommend adding ferrous gluconate tablet daily. Follow up with rectal exam in 3 months

## 2016-12-15 NOTE — Progress Notes (Signed)
Patient ID: Tracy Silva, female   DOB: 1940/12/03, 76 y.o.   MRN: [id number]  Chief Complaint  Patient presents with  . Follow-up    HPI Tracy Silva is a 76 y.o. female.  Here for follow up from her colectomy 08-21-16. She states she stopped the probiotics because her bowels were too loose. She states now bowels are good and formed and daily. No perineal pain. Energy level slowly improving. HPI  Past Medical History:  Diagnosis Date  . Acid reflux   . Anemia    H/O AS A CHILD  . Arthritis    FINGERS AND SHOULDER-RIGHT  . Asthma    AS A CHILD  . Diabetes mellitus without complication ([place])   . Heart murmur    ASYMPTOMATIC  . High cholesterol   . Hypertension   . Hypothyroidism   . Other and unspecified noninfectious gastroenteritis and colitis(558.9) 06/27/2012  . Rectal bleeding 2011  . Rectal cancer ([place]) 07/16/2016    Past Surgical History:  Procedure Laterality Date  . COLONOSCOPY  2011   X2  . COLONOSCOPY IN O.R N/A 07/27/2016   Performed by Robert Bellow, MD at [facility] ORS  . EXCISION OF RECTAL MASS  07/27/2016   Performed by Robert Bellow, MD at [facility] ORS  . IUD REMOVAL    . LAPAROSCOPIC ASSISTED LEFT HEMI-COLECTOMY Left 08/21/2016   Performed by Robert Bellow, MD at [facility] ORS  . RECTAL EXAM UNDER ANESTHESIA N/A 07/27/2016   Performed by Robert Bellow, MD at [facility] ORS  . TONSILLECTOMY    . TOOTH EXTRACTION    . WISDOM TOOTH EXTRACTION      Family History  Problem Relation Age of Onset  . Pancreatic cancer Mother   . Breast cancer Paternal Aunt   . Cancer Maternal Aunt     Social History Social History   Tobacco Use  . Smoking status: Former Smoker    Packs/day: 0.50    Years: 4.00    Pack years: 2.00    Last attempt to quit: 01/26/1971    Years since quitting: 45.9  . Smokeless tobacco: Never Used  Substance Use Topics  . Alcohol use: Yes    Comment: occasionally with dinner  . Drug use: No    Allergies  Allergen Reactions   . Codeine Other (See Comments)    Headaches Heart palpations  . Statins     Muscle pain, joint pain  . Adhesive [Tape] Rash    FOR  LONG PERIODS OF TIME FOR  LONG PERIODS OF TIME    Current Outpatient Medications  Medication Sig Dispense Refill  . amLODipine-benazepril (LOTREL) 5-10 MG capsule TAKE 1 CAPSULE BY MOUTH DAILY FOR BLOOD PRESSURE (Patient taking differently: TAKE 1 CAPSULE BY MOUTH DAILY FOR BLOOD PRESSURE-AM) 30 capsule 12  . Ascorbic Acid (VITAMIN C) 100 MG tablet Take 100 mg by mouth daily.    Marland Kitchen ezetimibe (ZETIA) 10 MG tablet Take 1 tablet (10 mg total) by mouth every morning. 30 tablet 12  . glipiZIDE (GLUCOTROL) 5 MG tablet Take 5 mg by mouth daily before breakfast.    . hydrochlorothiazide (MICROZIDE) 12.5 MG capsule TAKE ONE CAPSULE EVERYDAY FOR BLOOD PRESSURE/ FLUID 90 capsule 3  . levothyroxine (SYNTHROID, LEVOTHROID) 100 MCG tablet TAKE 1 TABLET EVERY DAY ON AN EMPTY STOMACH WITH A GLASS OF WATER AT LEAST 30-60 MINUTES BEFORE BREAKFAST FOR THYROID 90 tablet 3  . metoprolol succinate (TOPROL-XL) 50 MG 24 hr tablet Take 1 tablet (  50 mg total) by mouth daily. Take with or immediately following a meal. 30 tablet 12  . Multiple Vitamins-Calcium (ONE-A-DAY WOMENS PO) Take by mouth daily.    Marland Kitchen omeprazole (PRILOSEC OTC) 20 MG tablet Take 20 mg by mouth daily.    . pioglitazone (ACTOS) 45 MG tablet TAKE 1 TABLET BY MOUTH DAILY 30 tablet 12   No current facility-administered medications for this visit.     Review of Systems Review of Systems  Constitutional: Negative.   Respiratory: Negative.   Cardiovascular: Negative.   Gastrointestinal: Negative for constipation and diarrhea.    Blood pressure 118/64, pulse 84, resp. rate 12, height 5\' 3"  (1.6 m), weight 164 lb (74.4 kg), SpO2 97 %. Weight is down 5 pounds from preop value.  Physical Exam Physical Exam  Constitutional: She is oriented to person, place, and time. She appears well-developed and well-nourished.   HENT:  Mouth/Throat: Oropharynx is clear and moist.  Eyes: Conjunctivae are normal. No scleral icterus.  Neck: Neck supple.  Cardiovascular: Normal rate, regular rhythm and normal heart sounds.  Pulmonary/Chest: Effort normal and breath sounds normal.  Abdominal: Soft. Normal appearance and bowel sounds are normal. No hernia.    Lymphadenopathy:    She has no cervical adenopathy.  Neurological: She is alert and oriented to person, place, and time.  Skin: Skin is warm and dry.  Psychiatric: Her behavior is normal.    Data Reviewed CEA prior to surgery 1.0.  Assessment    Doing well status post left hemicolectomy for node positive disease, patient declined adjuvant chemotherapy.  Doing well status post transanal excision of adenocarcinoma.    Plan    We discussed whether screening CEA values would be of benefit.  Recommendations are to do this every 3 months for the first year, even if normal to start.  The patient reports that if she had an elevation she would not consider chemotherapy and for that reason we will defer.   Her most recent laboratory study with her primary care physician still shows a modest anemia with a hemoglobin of 9.9 with an MCV of 94.  CBC January 2017 showed hemoglobin of 12.0 with an MCV of 98. Serum iron 61 (45-160).    She has declined chemotherapy, and declines labs. Recommend adding ferrous gluconate tablet daily. Follow up with rectal exam in 3 months  HPI, Physical Exam, Assessment and Plan have been scribed under the direction and in the presence of Robert Bellow, MD. Karie Fetch, RN  I have completed the exam and reviewed the above documentation for accuracy and completeness.  I agree with the above.  Haematologist has been used and any errors in dictation or transcription are unintentional.  Hervey Ard, M.D., F.A.C.S.   Robert Bellow 12/15/2016, 11:32 AM

## 2017-03-03 ENCOUNTER — Other Ambulatory Visit: Payer: Self-pay | Admitting: Emergency Medicine

## 2017-03-03 DIAGNOSIS — E119 Type 2 diabetes mellitus without complications: Secondary | ICD-10-CM

## 2017-03-03 DIAGNOSIS — E7849 Other hyperlipidemia: Secondary | ICD-10-CM

## 2017-03-03 DIAGNOSIS — I10 Essential (primary) hypertension: Secondary | ICD-10-CM

## 2017-03-03 MED ORDER — PIOGLITAZONE HCL 45 MG PO TABS: 45.0000 mg | ORAL_TABLET | Freq: Every day | ORAL | 3 refills | Status: DC

## 2017-03-03 MED ORDER — LEVOTHYROXINE SODIUM 100 MCG PO TABS: ORAL_TABLET | ORAL | 3 refills | Status: DC

## 2017-03-03 MED ORDER — HYDROCHLOROTHIAZIDE 12.5 MG PO CAPS: 12.5000 mg | ORAL_CAPSULE | Freq: Every day | ORAL | 3 refills | Status: DC

## 2017-03-03 MED ORDER — EZETIMIBE 10 MG PO TABS: 10.0000 mg | ORAL_TABLET | Freq: Every morning | ORAL | 3 refills | Status: DC

## 2017-03-03 MED ORDER — AMLODIPINE BESY-BENAZEPRIL HCL 5-10 MG PO CAPS: ORAL_CAPSULE | ORAL | 3 refills | Status: DC

## 2017-03-03 MED ORDER — METOPROLOL SUCCINATE ER 50 MG PO TB24: 50.0000 mg | ORAL_TABLET | Freq: Every day | ORAL | 3 refills | Status: DC

## 2017-03-03 NOTE — Progress Notes (Signed)
pharmacy needed updated meds due to pharmacy changing and getting mixed up in the change.

## 2017-03-16 ENCOUNTER — Ambulatory Visit: Payer: Self-pay | Admitting: General Surgery

## 2017-03-18 ENCOUNTER — Ambulatory Visit: Payer: Medicare Other | Admitting: General Surgery

## 2017-03-18 ENCOUNTER — Encounter: Payer: Self-pay | Admitting: General Surgery

## 2017-03-18 VITALS — BP 122/62 | HR 98 | Resp 16 | Ht 63.0 in | Wt 174.0 lb

## 2017-03-18 DIAGNOSIS — C20 Malignant neoplasm of rectum: Secondary | ICD-10-CM | POA: Diagnosis not present

## 2017-03-18 DIAGNOSIS — C186 Malignant neoplasm of descending colon: Secondary | ICD-10-CM

## 2017-03-18 NOTE — Patient Instructions (Addendum)
The patient is aware to call back for any questions or new concerns.  

## 2017-03-18 NOTE — Progress Notes (Signed)
Patient ID: Tracy Silva, female   DOB: April 30, 1940, 77 y.o.   MRN: [id number]  Chief Complaint  Patient presents with  . Follow-up    HPI Tracy Silva is a 77 y.o. female.  Here for follow up post left hemi-colectomy done 08-21-17. She states she is doing "great". Denies any GI issues. States her bowels are moving daily. She denies abdominal pain.   HPI  Past Medical History:  Diagnosis Date  . Acid reflux   . Anemia    H/O AS A CHILD  . Arthritis    FINGERS AND SHOULDER-RIGHT  . Asthma    AS A CHILD  . Diabetes mellitus without complication ([place])   . Heart murmur    ASYMPTOMATIC  . High cholesterol   . Hypertension   . Hypothyroidism   . Other and unspecified noninfectious gastroenteritis and colitis(558.9) 06/27/2012  . Rectal bleeding 2011  . Rectal cancer ([place]) 07/16/2016    Past Surgical History:  Procedure Laterality Date  . COLONOSCOPY  2011   X2  . COLONOSCOPY N/A 07/27/2016   Procedure: COLONOSCOPY IN O.R;  Surgeon: Robert Bellow, MD;  Location: [facility] ORS;  Service: General;  Laterality: N/A;  . IUD REMOVAL    . LAPAROSCOPIC PARTIAL COLECTOMY Left 08/21/2016   Procedure: LAPAROSCOPIC ASSISTED LEFT HEMI-COLECTOMY;  Surgeon: Robert Bellow, MD;  Location: [facility] ORS;  Service: General;  Laterality: Left;  . RECTAL EXAM UNDER ANESTHESIA N/A 07/27/2016   Procedure: RECTAL EXAM UNDER ANESTHESIA;  Surgeon: Robert Bellow, MD;  Location: [facility] ORS;  Service: General;  Laterality: N/A;  . TONSILLECTOMY    . TOOTH EXTRACTION    . TRANSANAL EXCISION OF RECTAL MASS  07/27/2016   Procedure: EXCISION OF RECTAL MASS;  Surgeon: Robert Bellow, MD;  Location: [facility] ORS;  Service: General;;  . WISDOM TOOTH EXTRACTION      Family History  Problem Relation Age of Onset  . Pancreatic cancer Mother   . Breast cancer Paternal Aunt   . Cancer Maternal Aunt     Social History Social History   Tobacco Use  . Smoking status: Former Smoker    Packs/day: 0.50   Years: 4.00    Pack years: 2.00    Last attempt to quit: 01/26/1971    Years since quitting: 46.1  . Smokeless tobacco: Never Used  Substance Use Topics  . Alcohol use: Yes    Comment: occasionally with dinner  . Drug use: No    Allergies  Allergen Reactions  . Codeine Other (See Comments)    Headaches Heart palpations  . Statins     Muscle pain, joint pain  . Adhesive [Tape] Rash    FOR  LONG PERIODS OF TIME FOR  LONG PERIODS OF TIME    Current Outpatient Medications  Medication Sig Dispense Refill  . amLODipine-benazepril (LOTREL) 5-10 MG capsule TAKE 1 CAPSULE BY MOUTH DAILY FOR BLOOD PRESSURE 90 capsule 3  . ezetimibe (ZETIA) 10 MG tablet Take 1 tablet (10 mg total) by mouth every morning. 90 tablet 3  . ferrous sulfate 325 (65 FE) MG tablet Take 325 mg by mouth daily with breakfast.    . glipiZIDE (GLUCOTROL) 5 MG tablet Take 5 mg by mouth daily before breakfast.    . hydrochlorothiazide (MICROZIDE) 12.5 MG capsule Take 1 capsule (12.5 mg total) by mouth daily. 90 capsule 3  . levothyroxine (SYNTHROID, LEVOTHROID) 100 MCG tablet TAKE 1 TABLET EVERY DAY ON AN EMPTY STOMACH WITH A GLASS OF  WATER AT LEAST 30-60 MINUTES BEFORE BREAKFAST FOR THYROID 90 tablet 3  . loratadine (CLARITIN) 10 MG tablet Take 10 mg by mouth daily.    . metoprolol succinate (TOPROL-XL) 50 MG 24 hr tablet Take 1 tablet (50 mg total) by mouth daily. Take with or immediately following a meal. 90 tablet 3  . Multiple Vitamins-Calcium (ONE-A-DAY WOMENS PO) Take by mouth daily.    Marland Kitchen omeprazole (PRILOSEC OTC) 20 MG tablet Take 20 mg by mouth daily.    . pioglitazone (ACTOS) 45 MG tablet Take 1 tablet (45 mg total) by mouth daily. 90 tablet 3   No current facility-administered medications for this visit.     Review of Systems Review of Systems  Constitutional: Negative.   Respiratory: Negative.   Cardiovascular: Negative.     Blood pressure 122/62, pulse 98, resp. rate 16, height 5\' 3"  (1.6 m), weight  174 lb (78.9 kg).  Physical Exam Physical Exam  Constitutional: She is oriented to person, place, and time. She appears well-developed and well-nourished.  HENT:  Mouth/Throat: Oropharynx is clear and moist.  Eyes: Conjunctivae are normal. No scleral icterus.  Neck: Neck supple.  Cardiovascular: Normal rate and regular rhythm.  Murmur heard.  Systolic murmur is present with a grade of 3/6. No lower leg edema   Pulmonary/Chest: Effort normal and breath sounds normal.  Abdominal: Soft. Normal appearance and bowel sounds are normal. There is no tenderness.    Lymphadenopathy:    She has no cervical adenopathy.       Right: No inguinal adenopathy present.       Left: No inguinal and no supraclavicular adenopathy present.  Neurological: She is alert and oriented to person, place, and time.  Skin: Skin is warm and dry.  Psychiatric: Her behavior is normal.    Data Reviewed Anoscopy was completed after digital rectal exam.  No masses, tenderness or mucosal abnormality.  No evidence of recurrent tumor.  CBC from November 25, 2016 showed modest anemia with a hemoglobin just under 10.  Normal MCV.  Patient continues on iron supplements.  Assessment    Doing well status post descending colectomy and transanal excision of rectal cancer.    Plan    Follow up- in 5 months. Colonoscopy will be due at that time.      HPI, Physical Exam, Assessment and Plan have been scribed under the direction and in the presence of Robert Bellow, MD. Karie Fetch, RN  I have completed the exam and reviewed the above documentation for accuracy and completeness.  I agree with the above.  Haematologist has been used and any errors in dictation or transcription are unintentional.  Hervey Ard, M.D., F.A.C.S.  Tracy Silva 03/18/2017, 11:22 AM

## 2017-03-25 ENCOUNTER — Ambulatory Visit (INDEPENDENT_AMBULATORY_CARE_PROVIDER_SITE_OTHER): Payer: Medicare Other | Admitting: Family Medicine

## 2017-03-25 ENCOUNTER — Encounter: Payer: Self-pay | Admitting: Family Medicine

## 2017-03-25 VITALS — BP 98/56 | HR 78 | Temp 97.7°F | Resp 16 | Wt 176.0 lb

## 2017-03-25 DIAGNOSIS — I10 Essential (primary) hypertension: Secondary | ICD-10-CM

## 2017-03-25 DIAGNOSIS — E669 Obesity, unspecified: Secondary | ICD-10-CM

## 2017-03-25 DIAGNOSIS — D509 Iron deficiency anemia, unspecified: Secondary | ICD-10-CM

## 2017-03-25 DIAGNOSIS — E1169 Type 2 diabetes mellitus with other specified complication: Secondary | ICD-10-CM

## 2017-03-25 NOTE — Progress Notes (Signed)
Patient: Tracy Silva Female    DOB: [birth year]   76 y.o.   MRN: [id number] Visit Date: 03/25/2017  Today's Provider: Wilhemena Durie, MD   Chief Complaint  Patient presents with  . Diabetes  . Anemia   Subjective:    Overall pt feels very well.    Per lab note pt needs CBC repeat from back in 11/2016, but was not repeated. Tracy Silva put pt on Ferrous Gluconate 324 mg     Diabetes Mellitus Type II, Follow-up:   Lab Results  Component Value Date   HGBA1C 7.1 08/25/2016   HGBA1C 7.6 02/19/2016   HGBA1C 7.8 10/02/2015    Last seen for diabetes 4 months ago.  Management since then includes none. She reports good compliance with treatment. She is not having side effects.  Home blood sugar records: not being checked  Episodes of hypoglycemia? no   Current Insulin Regimen: n/a Most Recent Eye Exam: pt had to cancel and just needs to reschedule her appt.  Current exercise: walking  Pertinent Labs:    Component Value Date/Time   CHOL 241 (H) 09/24/2016 0808   TRIG 321 (H) 09/24/2016 0808   HDL 45 09/24/2016 0808   LDLCALC 132 (H) 09/24/2016 0808   CREATININE 1.62 (H) 11/30/2016 1019    Wt Readings from Last 3 Encounters:  03/25/17 176 lb (79.8 kg)  03/18/17 174 lb (78.9 kg)  12/15/16 164 lb (74.4 kg)    ------------------------------------------------------------------------      Allergies  Allergen Reactions  . Codeine Other (See Comments)    Headaches Heart palpations  . Statins     Muscle pain, joint pain  . Adhesive [Tape] Rash    FOR  LONG PERIODS OF TIME FOR  LONG PERIODS OF TIME     Current Outpatient Medications:  .  amLODipine-benazepril (LOTREL) 5-10 MG capsule, TAKE 1 CAPSULE BY MOUTH DAILY FOR BLOOD PRESSURE, Disp: 90 capsule, Rfl: 3 .  ezetimibe (ZETIA) 10 MG tablet, Take 1 tablet (10 mg total) by mouth every morning., Disp: 90 tablet, Rfl: 3 .  ferrous gluconate (FERGON) 324 MG tablet, Take 324 mg by mouth daily with  breakfast., Disp: , Rfl:  .  glipiZIDE (GLUCOTROL) 5 MG tablet, Take 5 mg by mouth daily before breakfast., Disp: , Rfl:  .  hydrochlorothiazide (MICROZIDE) 12.5 MG capsule, Take 1 capsule (12.5 mg total) by mouth daily., Disp: 90 capsule, Rfl: 3 .  levothyroxine (SYNTHROID, LEVOTHROID) 100 MCG tablet, TAKE 1 TABLET EVERY DAY ON AN EMPTY STOMACH WITH A GLASS OF WATER AT LEAST 30-60 MINUTES BEFORE BREAKFAST FOR THYROID, Disp: 90 tablet, Rfl: 3 .  metoprolol succinate (TOPROL-XL) 50 MG 24 hr tablet, Take 1 tablet (50 mg total) by mouth daily. Take with or immediately following a meal., Disp: 90 tablet, Rfl: 3 .  Multiple Vitamins-Calcium (ONE-A-DAY WOMENS PO), Take by mouth daily., Disp: , Rfl:  .  omeprazole (PRILOSEC OTC) 20 MG tablet, Take 20 mg by mouth daily., Disp: , Rfl:  .  pioglitazone (ACTOS) 45 MG tablet, Take 1 tablet (45 mg total) by mouth daily., Disp: 90 tablet, Rfl: 3 .  ferrous sulfate 325 (65 FE) MG tablet, Take 325 mg by mouth daily with breakfast., Disp: , Rfl:  .  loratadine (CLARITIN) 10 MG tablet, Take 10 mg by mouth daily., Disp: , Rfl:   Review of Systems  Constitutional: Negative.   HENT: Negative.   Eyes: Negative.   Respiratory: Negative.  Cardiovascular: Negative.   Gastrointestinal: Negative.   Endocrine: Negative.   Genitourinary: Negative.   Musculoskeletal: Negative.   Skin: Negative.   Allergic/Immunologic: Negative.   Neurological: Negative.   Hematological: Negative.   Psychiatric/Behavioral: Negative.     Social History   Tobacco Use  . Smoking status: Former Smoker    Packs/day: 0.50    Years: 4.00    Pack years: 2.00    Last attempt to quit: 01/26/1971    Years since quitting: 46.1  . Smokeless tobacco: Never Used  Substance Use Topics  . Alcohol use: Yes    Comment: occasionally with dinner   Objective:   BP (!) 98/56 (BP Location: Left Arm, Patient Position: Sitting, Cuff Size: Large)   Pulse 78   Temp 97.7 F (36.5 C) (Oral)    Resp 16   Wt 176 lb (79.8 kg)   BMI 31.18 kg/m  Vitals:   03/25/17 1133  BP: (!) 98/56  Pulse: 78  Resp: 16  Temp: 97.7 F (36.5 C)  TempSrc: Oral  Weight: 176 lb (79.8 kg)     Physical Exam  Constitutional: She is oriented to person, place, and time. She appears well-developed and well-nourished.  HENT:  Head: Normocephalic and atraumatic.  Eyes: Conjunctivae are normal.  Neck: No thyromegaly present.  Cardiovascular: Normal rate, regular rhythm and normal heart sounds.  2/6 RUSB systolic murmur.  Pulmonary/Chest: Effort normal and breath sounds normal.  Abdominal: Soft.  Neurological: She is alert and oriented to person, place, and time.  Skin: Skin is warm and dry.  Psychiatric: She has a normal mood and affect. Her behavior is normal. Judgment and thought content normal.        Assessment & Plan:     1. Essential (primary) hypertension Hypotensive today but asymptomatic.consider stopping a med.  2. Diabetes mellitus type 2 in obese (HCC)  - Hemoglobin A1c  3. Iron deficiency anemia, unspecified iron deficiency anemia type  - CBC with Differential/Platelet - Iron and TIBC 4.Colon Cancer  I have done the exam and reviewed the chart and it is accurate to the best of my knowledge. Development worker, community has been used and  any errors in dictation or transcription are unintentional. Miguel Aschoff M.D. [place], MD  [facility]

## 2017-03-26 LAB — CBC WITH DIFFERENTIAL/PLATELET
BASOS ABS: 0 10*3/uL (ref 0.0–0.2)
BASOS: 1 %
EOS (ABSOLUTE): 0.2 10*3/uL (ref 0.0–0.4)
Eos: 5 %
HEMOGLOBIN: 10.5 g/dL — AB (ref 11.1–15.9)
Hematocrit: 32.3 % — ABNORMAL LOW (ref 34.0–46.6)
IMMATURE GRANS (ABS): 0 10*3/uL (ref 0.0–0.1)
Immature Granulocytes: 0 %
LYMPHS: 31 %
Lymphocytes Absolute: 1.3 10*3/uL (ref 0.7–3.1)
MCH: 32 pg (ref 26.6–33.0)
MCHC: 32.5 g/dL (ref 31.5–35.7)
MCV: 99 fL — AB (ref 79–97)
MONOCYTES: 11 %
Monocytes Absolute: 0.5 10*3/uL (ref 0.1–0.9)
Neutrophils Absolute: 2.2 10*3/uL (ref 1.4–7.0)
Neutrophils: 52 %
Platelets: 218 10*3/uL (ref 150–379)
RBC: 3.28 x10E6/uL — ABNORMAL LOW (ref 3.77–5.28)
RDW: 15.1 % (ref 12.3–15.4)
WBC: 4.2 10*3/uL (ref 3.4–10.8)

## 2017-03-26 LAB — IRON AND TIBC
IRON SATURATION: 31 % (ref 15–55)
Iron: 108 ug/dL (ref 27–139)
TIBC: 345 ug/dL (ref 250–450)
UIBC: 237 ug/dL (ref 118–369)

## 2017-03-26 LAB — HEMOGLOBIN A1C
Est. average glucose Bld gHb Est-mCnc: 134 mg/dL
HEMOGLOBIN A1C: 6.3 % — AB (ref 4.8–5.6)

## 2017-03-30 ENCOUNTER — Telehealth: Payer: Self-pay | Admitting: Emergency Medicine

## 2017-03-30 NOTE — Telephone Encounter (Signed)
-----   Message from Jerrol Banana., MD sent at 03/30/2017  9:45 AM EST ----- Stable.

## 2017-03-30 NOTE — Telephone Encounter (Signed)
LMTCB

## 2017-04-01 NOTE — Telephone Encounter (Signed)
Pt advised.

## 2017-04-19 DIAGNOSIS — M9903 Segmental and somatic dysfunction of lumbar region: Secondary | ICD-10-CM | POA: Diagnosis not present

## 2017-04-19 DIAGNOSIS — M9905 Segmental and somatic dysfunction of pelvic region: Secondary | ICD-10-CM | POA: Diagnosis not present

## 2017-04-19 DIAGNOSIS — M5416 Radiculopathy, lumbar region: Secondary | ICD-10-CM | POA: Diagnosis not present

## 2017-04-19 DIAGNOSIS — M955 Acquired deformity of pelvis: Secondary | ICD-10-CM | POA: Diagnosis not present

## 2017-04-21 DIAGNOSIS — M955 Acquired deformity of pelvis: Secondary | ICD-10-CM | POA: Diagnosis not present

## 2017-04-21 DIAGNOSIS — M9905 Segmental and somatic dysfunction of pelvic region: Secondary | ICD-10-CM | POA: Diagnosis not present

## 2017-04-21 DIAGNOSIS — M9903 Segmental and somatic dysfunction of lumbar region: Secondary | ICD-10-CM | POA: Diagnosis not present

## 2017-04-21 DIAGNOSIS — M5416 Radiculopathy, lumbar region: Secondary | ICD-10-CM | POA: Diagnosis not present

## 2017-04-22 DIAGNOSIS — M9903 Segmental and somatic dysfunction of lumbar region: Secondary | ICD-10-CM | POA: Diagnosis not present

## 2017-04-22 DIAGNOSIS — M955 Acquired deformity of pelvis: Secondary | ICD-10-CM | POA: Diagnosis not present

## 2017-04-22 DIAGNOSIS — M9905 Segmental and somatic dysfunction of pelvic region: Secondary | ICD-10-CM | POA: Diagnosis not present

## 2017-04-22 DIAGNOSIS — M5416 Radiculopathy, lumbar region: Secondary | ICD-10-CM | POA: Diagnosis not present

## 2017-04-26 DIAGNOSIS — M5416 Radiculopathy, lumbar region: Secondary | ICD-10-CM | POA: Diagnosis not present

## 2017-04-26 DIAGNOSIS — M955 Acquired deformity of pelvis: Secondary | ICD-10-CM | POA: Diagnosis not present

## 2017-04-26 DIAGNOSIS — M9903 Segmental and somatic dysfunction of lumbar region: Secondary | ICD-10-CM | POA: Diagnosis not present

## 2017-04-26 DIAGNOSIS — M9905 Segmental and somatic dysfunction of pelvic region: Secondary | ICD-10-CM | POA: Diagnosis not present

## 2017-04-28 DIAGNOSIS — M9903 Segmental and somatic dysfunction of lumbar region: Secondary | ICD-10-CM | POA: Diagnosis not present

## 2017-04-28 DIAGNOSIS — M9905 Segmental and somatic dysfunction of pelvic region: Secondary | ICD-10-CM | POA: Diagnosis not present

## 2017-04-28 DIAGNOSIS — M955 Acquired deformity of pelvis: Secondary | ICD-10-CM | POA: Diagnosis not present

## 2017-04-28 DIAGNOSIS — M5416 Radiculopathy, lumbar region: Secondary | ICD-10-CM | POA: Diagnosis not present

## 2017-04-30 DIAGNOSIS — M955 Acquired deformity of pelvis: Secondary | ICD-10-CM | POA: Diagnosis not present

## 2017-04-30 DIAGNOSIS — M5416 Radiculopathy, lumbar region: Secondary | ICD-10-CM | POA: Diagnosis not present

## 2017-04-30 DIAGNOSIS — M9903 Segmental and somatic dysfunction of lumbar region: Secondary | ICD-10-CM | POA: Diagnosis not present

## 2017-04-30 DIAGNOSIS — M9905 Segmental and somatic dysfunction of pelvic region: Secondary | ICD-10-CM | POA: Diagnosis not present

## 2017-05-03 DIAGNOSIS — M955 Acquired deformity of pelvis: Secondary | ICD-10-CM | POA: Diagnosis not present

## 2017-05-03 DIAGNOSIS — M9903 Segmental and somatic dysfunction of lumbar region: Secondary | ICD-10-CM | POA: Diagnosis not present

## 2017-05-03 DIAGNOSIS — M5416 Radiculopathy, lumbar region: Secondary | ICD-10-CM | POA: Diagnosis not present

## 2017-05-03 DIAGNOSIS — M9905 Segmental and somatic dysfunction of pelvic region: Secondary | ICD-10-CM | POA: Diagnosis not present

## 2017-05-06 DIAGNOSIS — M9905 Segmental and somatic dysfunction of pelvic region: Secondary | ICD-10-CM | POA: Diagnosis not present

## 2017-05-06 DIAGNOSIS — M955 Acquired deformity of pelvis: Secondary | ICD-10-CM | POA: Diagnosis not present

## 2017-05-06 DIAGNOSIS — M5416 Radiculopathy, lumbar region: Secondary | ICD-10-CM | POA: Diagnosis not present

## 2017-05-06 DIAGNOSIS — M9903 Segmental and somatic dysfunction of lumbar region: Secondary | ICD-10-CM | POA: Diagnosis not present

## 2017-05-10 DIAGNOSIS — M9903 Segmental and somatic dysfunction of lumbar region: Secondary | ICD-10-CM | POA: Diagnosis not present

## 2017-05-10 DIAGNOSIS — M955 Acquired deformity of pelvis: Secondary | ICD-10-CM | POA: Diagnosis not present

## 2017-05-10 DIAGNOSIS — M5416 Radiculopathy, lumbar region: Secondary | ICD-10-CM | POA: Diagnosis not present

## 2017-05-10 DIAGNOSIS — M9905 Segmental and somatic dysfunction of pelvic region: Secondary | ICD-10-CM | POA: Diagnosis not present

## 2017-05-17 DIAGNOSIS — M955 Acquired deformity of pelvis: Secondary | ICD-10-CM | POA: Diagnosis not present

## 2017-05-17 DIAGNOSIS — M9905 Segmental and somatic dysfunction of pelvic region: Secondary | ICD-10-CM | POA: Diagnosis not present

## 2017-05-17 DIAGNOSIS — M5416 Radiculopathy, lumbar region: Secondary | ICD-10-CM | POA: Diagnosis not present

## 2017-05-17 DIAGNOSIS — M9903 Segmental and somatic dysfunction of lumbar region: Secondary | ICD-10-CM | POA: Diagnosis not present

## 2017-05-18 ENCOUNTER — Other Ambulatory Visit: Payer: Self-pay | Admitting: Emergency Medicine

## 2017-05-18 DIAGNOSIS — E1169 Type 2 diabetes mellitus with other specified complication: Secondary | ICD-10-CM

## 2017-05-18 DIAGNOSIS — E669 Obesity, unspecified: Principal | ICD-10-CM

## 2017-05-18 MED ORDER — GLIPIZIDE 5 MG PO TABS: 5.0000 mg | ORAL_TABLET | Freq: Every day | ORAL | 3 refills | Status: DC

## 2017-05-20 DIAGNOSIS — M955 Acquired deformity of pelvis: Secondary | ICD-10-CM | POA: Diagnosis not present

## 2017-05-20 DIAGNOSIS — M9903 Segmental and somatic dysfunction of lumbar region: Secondary | ICD-10-CM | POA: Diagnosis not present

## 2017-05-20 DIAGNOSIS — M5416 Radiculopathy, lumbar region: Secondary | ICD-10-CM | POA: Diagnosis not present

## 2017-05-20 DIAGNOSIS — M9905 Segmental and somatic dysfunction of pelvic region: Secondary | ICD-10-CM | POA: Diagnosis not present

## 2017-05-24 DIAGNOSIS — M955 Acquired deformity of pelvis: Secondary | ICD-10-CM | POA: Diagnosis not present

## 2017-05-24 DIAGNOSIS — M9903 Segmental and somatic dysfunction of lumbar region: Secondary | ICD-10-CM | POA: Diagnosis not present

## 2017-05-24 DIAGNOSIS — M5416 Radiculopathy, lumbar region: Secondary | ICD-10-CM | POA: Diagnosis not present

## 2017-05-24 DIAGNOSIS — M9905 Segmental and somatic dysfunction of pelvic region: Secondary | ICD-10-CM | POA: Diagnosis not present

## 2017-05-26 ENCOUNTER — Ambulatory Visit (INDEPENDENT_AMBULATORY_CARE_PROVIDER_SITE_OTHER): Payer: Medicare Other | Admitting: Family Medicine

## 2017-05-26 VITALS — BP 118/60 | HR 74 | Temp 98.6°F | Resp 16 | Wt 175.0 lb

## 2017-05-26 DIAGNOSIS — E669 Obesity, unspecified: Secondary | ICD-10-CM | POA: Diagnosis not present

## 2017-05-26 DIAGNOSIS — E039 Hypothyroidism, unspecified: Secondary | ICD-10-CM | POA: Diagnosis not present

## 2017-05-26 DIAGNOSIS — E1169 Type 2 diabetes mellitus with other specified complication: Secondary | ICD-10-CM | POA: Diagnosis not present

## 2017-05-26 DIAGNOSIS — I10 Essential (primary) hypertension: Secondary | ICD-10-CM

## 2017-05-26 DIAGNOSIS — D509 Iron deficiency anemia, unspecified: Secondary | ICD-10-CM | POA: Diagnosis not present

## 2017-05-26 NOTE — Progress Notes (Signed)
Tracy Silva  MRN: [id number] DOB: 01/19/1941  Subjective:  HPI   The patient is a 77 year old female who presents today for follow up of anemia.  She was last seen on 03/25/16.  Overall she feels well.  CBC Latest Ref Rng & Units 03/25/2017 11/30/2016 09/24/2016  WBC 3.4 - 10.8 x10E3/uL 4.2 3.8 4.7  Hemoglobin 11.1 - 15.9 g/dL 10.5(L) 9.9(L) 10.1(L)  Hematocrit 34.0 - 46.6 % 32.3(L) 29.7(L) 32.4(L)  Platelets 150 - 379 x10E3/uL 218 243 247    Patient Active Problem List   Diagnosis Date Noted  . Colon cancer ([place]) 08/21/2016  . Rectal cancer ([place]) 07/30/2016  . Cancer of descending colon ([place]) 07/30/2016  . LBBB (left bundle branch block) 07/24/2016  . Heart murmur, systolic 64/33/2951  . Acid reflux 09/07/2014  . AI (aortic incompetence) 09/07/2014  . Allergic rhinitis 09/07/2014  . Clinical depression 09/07/2014  . Diabetes mellitus type 2 in obese ([place]) 09/07/2014  . Essential (primary) hypertension 09/07/2014  . H/O thyroid disease 09/07/2014  . HLD (hyperlipidemia) 09/07/2014  . Acquired hypothyroidism 09/07/2014  . Adiposity 09/07/2014  . Post menopausal syndrome 09/07/2014  . Basal cell papilloma 09/07/2014  . Avitaminosis D 09/07/2014    Past Medical History:  Diagnosis Date  . Acid reflux   . Anemia    H/O AS A CHILD  . Arthritis    FINGERS AND SHOULDER-RIGHT  . Asthma    AS A CHILD  . Diabetes mellitus without complication ([place])   . Heart murmur    ASYMPTOMATIC  . High cholesterol   . Hypertension   . Hypothyroidism   . Other and unspecified noninfectious gastroenteritis and colitis(558.9) 06/27/2012  . Rectal bleeding 2011  . Rectal cancer ([place]) 07/16/2016    Social History   Socioeconomic History  . Marital status: Married    Spouse name: Not on file  . Number of children: Not on file  . Years of education: Not on file  . Highest education level: Not on file  Occupational History  . Not on file  Social Needs  . Financial resource  strain: Not on file  . Food insecurity:    Worry: Not on file    Inability: Not on file  . Transportation needs:    Medical: Not on file    Non-medical: Not on file  Tobacco Use  . Smoking status: Former Smoker    Packs/day: 0.50    Years: 4.00    Pack years: 2.00    Last attempt to quit: 01/26/1971    Years since quitting: 46.3  . Smokeless tobacco: Never Used  Substance and Sexual Activity  . Alcohol use: Yes    Comment: occasionally with dinner  . Drug use: No  . Sexual activity: Not on file  Lifestyle  . Physical activity:    Days per week: Not on file    Minutes per session: Not on file  . Stress: Not on file  Relationships  . Social connections:    Talks on phone: Not on file    Gets together: Not on file    Attends religious service: Not on file    Active member of club or organization: Not on file    Attends meetings of clubs or organizations: Not on file    Relationship status: Not on file  . Intimate partner violence:    Fear of current or ex partner: Not on file    Emotionally abused: Not on file  Physically abused: Not on file    Forced sexual activity: Not on file  Other Topics Concern  . Not on file  Social History Narrative  . Not on file    Outpatient Encounter Medications as of 05/26/2017  Medication Sig  . amLODipine-benazepril (LOTREL) 5-10 MG capsule TAKE 1 CAPSULE BY MOUTH DAILY FOR BLOOD PRESSURE  . ezetimibe (ZETIA) 10 MG tablet Take 1 tablet (10 mg total) by mouth every morning.  . ferrous sulfate 325 (65 FE) MG tablet Take 325 mg by mouth daily with breakfast.  . glipiZIDE (GLUCOTROL) 5 MG tablet Take 1 tablet (5 mg total) by mouth daily before breakfast.  . hydrochlorothiazide (MICROZIDE) 12.5 MG capsule Take 1 capsule (12.5 mg total) by mouth daily.  Marland Kitchen levothyroxine (SYNTHROID, LEVOTHROID) 100 MCG tablet TAKE 1 TABLET EVERY DAY ON AN EMPTY STOMACH WITH A GLASS OF WATER AT LEAST 30-60 MINUTES BEFORE BREAKFAST FOR THYROID  . metoprolol  succinate (TOPROL-XL) 50 MG 24 hr tablet Take 1 tablet (50 mg total) by mouth daily. Take with or immediately following a meal.  . Multiple Vitamins-Calcium (ONE-A-DAY WOMENS PO) Take by mouth daily.  Marland Kitchen omeprazole (PRILOSEC OTC) 20 MG tablet Take 20 mg by mouth daily.  . pioglitazone (ACTOS) 45 MG tablet Take 1 tablet (45 mg total) by mouth daily.  . [DISCONTINUED] ferrous gluconate (FERGON) 324 MG tablet Take 324 mg by mouth daily with breakfast.  . [DISCONTINUED] loratadine (CLARITIN) 10 MG tablet Take 10 mg by mouth daily.   No facility-administered encounter medications on file as of 05/26/2017.     Allergies  Allergen Reactions  . Codeine Other (See Comments)    Headaches Heart palpations  . Statins     Muscle pain, joint pain  . Adhesive [Tape] Rash    FOR  LONG PERIODS OF TIME FOR  LONG PERIODS OF TIME    Review of Systems  Constitutional: Negative for fever and malaise/fatigue.  HENT: Negative.   Eyes: Negative.   Respiratory: Negative for cough, shortness of breath and wheezing.   Cardiovascular: Negative for chest pain, palpitations, orthopnea, claudication and leg swelling.  Gastrointestinal: Negative.   Skin: Negative.   Neurological: Negative.   Endo/Heme/Allergies: Does not bruise/bleed easily.  Psychiatric/Behavioral: Negative.     Objective:  BP 118/60 (BP Location: Right Arm, Patient Position: Sitting, Cuff Size: Normal)   Pulse 74   Temp 98.6 F (37 C) (Oral)   Resp 16   Wt 175 lb (79.4 kg)   BMI 31.00 kg/m   Physical Exam  Constitutional: She is oriented to person, place, and time and well-developed, well-nourished, and in no distress.  HENT:  Head: Normocephalic and atraumatic.  Eyes: Conjunctivae are normal. No scleral icterus.  Neck: No thyromegaly present.  Cardiovascular: Normal rate, regular rhythm and normal heart sounds.  Pulmonary/Chest: Effort normal and breath sounds normal.  Abdominal: Soft.  Musculoskeletal: She exhibits no edema.    Neurological: She is alert and oriented to person, place, and time. Gait normal. GCS score is 15.  Skin: Skin is warm and dry.  Psychiatric: Mood, memory and judgment normal.    Assessment and Plan :  1. Iron deficiency anemia, unspecified iron deficiency anemia type  - CBC with Differential/Platelet - Iron  2. Diabetes mellitus type 2 in obese ([place])   3. Essential (primary) hypertension   4. Acquired hypothyroidism  - TSH 5.Colono cancer Pt doing well.  I have done the exam and reviewed the chart and it is accurate to the  best of my knowledge. Development worker, community has been used and  any errors in dictation or transcription are unintentional. Miguel Aschoff M.D. [facility]

## 2017-05-27 DIAGNOSIS — M9903 Segmental and somatic dysfunction of lumbar region: Secondary | ICD-10-CM | POA: Diagnosis not present

## 2017-05-27 DIAGNOSIS — E039 Hypothyroidism, unspecified: Secondary | ICD-10-CM | POA: Diagnosis not present

## 2017-05-27 DIAGNOSIS — M9905 Segmental and somatic dysfunction of pelvic region: Secondary | ICD-10-CM | POA: Diagnosis not present

## 2017-05-27 DIAGNOSIS — M5416 Radiculopathy, lumbar region: Secondary | ICD-10-CM | POA: Diagnosis not present

## 2017-05-27 DIAGNOSIS — M955 Acquired deformity of pelvis: Secondary | ICD-10-CM | POA: Diagnosis not present

## 2017-05-27 DIAGNOSIS — D509 Iron deficiency anemia, unspecified: Secondary | ICD-10-CM | POA: Diagnosis not present

## 2017-05-28 ENCOUNTER — Telehealth: Payer: Self-pay

## 2017-05-28 LAB — CBC WITH DIFFERENTIAL/PLATELET
Basophils Absolute: 0 x10E3/uL (ref 0.0–0.2)
Basos: 1 %
EOS (ABSOLUTE): 0.2 x10E3/uL (ref 0.0–0.4)
Eos: 5 %
Hematocrit: 31.3 % — ABNORMAL LOW (ref 34.0–46.6)
Hemoglobin: 10.8 g/dL — ABNORMAL LOW (ref 11.1–15.9)
Immature Grans (Abs): 0 x10E3/uL (ref 0.0–0.1)
Immature Granulocytes: 0 %
Lymphocytes Absolute: 1.3 x10E3/uL (ref 0.7–3.1)
Lymphs: 31 %
MCH: 33.5 pg — ABNORMAL HIGH (ref 26.6–33.0)
MCHC: 34.5 g/dL (ref 31.5–35.7)
MCV: 97 fL (ref 79–97)
Monocytes Absolute: 0.5 x10E3/uL (ref 0.1–0.9)
Monocytes: 11 %
Neutrophils Absolute: 2.2 x10E3/uL (ref 1.4–7.0)
Neutrophils: 52 %
Platelets: 206 x10E3/uL (ref 150–379)
RBC: 3.22 x10E6/uL — ABNORMAL LOW (ref 3.77–5.28)
RDW: 14.3 % (ref 12.3–15.4)
WBC: 4.1 x10E3/uL (ref 3.4–10.8)

## 2017-05-28 LAB — TSH: TSH: 9.05 u[IU]/mL — ABNORMAL HIGH (ref 0.450–4.500)

## 2017-05-28 LAB — IRON: Iron: 103 ug/dL (ref 27–139)

## 2017-05-28 NOTE — Telephone Encounter (Signed)
Tried calling patient and no answer. Will try again later.  

## 2017-05-28 NOTE — Telephone Encounter (Signed)
-----   Message from Jerrol Banana., MD sent at 05/28/2017 10:09 AM EDT ----- Increase synthroid from 100 to 112 mcg daily.

## 2017-05-31 MED ORDER — LEVOTHYROXINE SODIUM 112 MCG PO TABS: ORAL_TABLET | ORAL | 3 refills | Status: DC

## 2017-05-31 NOTE — Telephone Encounter (Signed)
Advised and Rx sent to pharmacy 

## 2017-06-01 DIAGNOSIS — M9903 Segmental and somatic dysfunction of lumbar region: Secondary | ICD-10-CM | POA: Diagnosis not present

## 2017-06-01 DIAGNOSIS — M9905 Segmental and somatic dysfunction of pelvic region: Secondary | ICD-10-CM | POA: Diagnosis not present

## 2017-06-01 DIAGNOSIS — M5416 Radiculopathy, lumbar region: Secondary | ICD-10-CM | POA: Diagnosis not present

## 2017-06-01 DIAGNOSIS — M955 Acquired deformity of pelvis: Secondary | ICD-10-CM | POA: Diagnosis not present

## 2017-06-14 DIAGNOSIS — M5416 Radiculopathy, lumbar region: Secondary | ICD-10-CM | POA: Diagnosis not present

## 2017-06-14 DIAGNOSIS — M9903 Segmental and somatic dysfunction of lumbar region: Secondary | ICD-10-CM | POA: Diagnosis not present

## 2017-06-14 DIAGNOSIS — M955 Acquired deformity of pelvis: Secondary | ICD-10-CM | POA: Diagnosis not present

## 2017-06-14 DIAGNOSIS — M9905 Segmental and somatic dysfunction of pelvic region: Secondary | ICD-10-CM | POA: Diagnosis not present

## 2017-06-22 DIAGNOSIS — M9903 Segmental and somatic dysfunction of lumbar region: Secondary | ICD-10-CM | POA: Diagnosis not present

## 2017-06-22 DIAGNOSIS — M5416 Radiculopathy, lumbar region: Secondary | ICD-10-CM | POA: Diagnosis not present

## 2017-06-22 DIAGNOSIS — M9905 Segmental and somatic dysfunction of pelvic region: Secondary | ICD-10-CM | POA: Diagnosis not present

## 2017-06-22 DIAGNOSIS — M955 Acquired deformity of pelvis: Secondary | ICD-10-CM | POA: Diagnosis not present

## 2017-07-01 DIAGNOSIS — M9903 Segmental and somatic dysfunction of lumbar region: Secondary | ICD-10-CM | POA: Diagnosis not present

## 2017-07-01 DIAGNOSIS — M5416 Radiculopathy, lumbar region: Secondary | ICD-10-CM | POA: Diagnosis not present

## 2017-07-01 DIAGNOSIS — M9905 Segmental and somatic dysfunction of pelvic region: Secondary | ICD-10-CM | POA: Diagnosis not present

## 2017-07-01 DIAGNOSIS — M955 Acquired deformity of pelvis: Secondary | ICD-10-CM | POA: Diagnosis not present

## 2017-07-19 DIAGNOSIS — M9903 Segmental and somatic dysfunction of lumbar region: Secondary | ICD-10-CM | POA: Diagnosis not present

## 2017-07-19 DIAGNOSIS — M5416 Radiculopathy, lumbar region: Secondary | ICD-10-CM | POA: Diagnosis not present

## 2017-07-19 DIAGNOSIS — M955 Acquired deformity of pelvis: Secondary | ICD-10-CM | POA: Diagnosis not present

## 2017-07-19 DIAGNOSIS — M9905 Segmental and somatic dysfunction of pelvic region: Secondary | ICD-10-CM | POA: Diagnosis not present

## 2017-07-26 DIAGNOSIS — M9905 Segmental and somatic dysfunction of pelvic region: Secondary | ICD-10-CM | POA: Diagnosis not present

## 2017-07-26 DIAGNOSIS — M5416 Radiculopathy, lumbar region: Secondary | ICD-10-CM | POA: Diagnosis not present

## 2017-07-26 DIAGNOSIS — M955 Acquired deformity of pelvis: Secondary | ICD-10-CM | POA: Diagnosis not present

## 2017-07-26 DIAGNOSIS — M9903 Segmental and somatic dysfunction of lumbar region: Secondary | ICD-10-CM | POA: Diagnosis not present

## 2017-08-18 DIAGNOSIS — M5416 Radiculopathy, lumbar region: Secondary | ICD-10-CM | POA: Diagnosis not present

## 2017-08-18 DIAGNOSIS — M9903 Segmental and somatic dysfunction of lumbar region: Secondary | ICD-10-CM | POA: Diagnosis not present

## 2017-08-18 DIAGNOSIS — M9905 Segmental and somatic dysfunction of pelvic region: Secondary | ICD-10-CM | POA: Diagnosis not present

## 2017-08-18 DIAGNOSIS — M955 Acquired deformity of pelvis: Secondary | ICD-10-CM | POA: Diagnosis not present

## 2017-08-19 ENCOUNTER — Ambulatory Visit: Payer: Self-pay | Admitting: General Surgery

## 2017-09-02 DIAGNOSIS — M5416 Radiculopathy, lumbar region: Secondary | ICD-10-CM | POA: Diagnosis not present

## 2017-09-02 DIAGNOSIS — M9905 Segmental and somatic dysfunction of pelvic region: Secondary | ICD-10-CM | POA: Diagnosis not present

## 2017-09-02 DIAGNOSIS — M955 Acquired deformity of pelvis: Secondary | ICD-10-CM | POA: Diagnosis not present

## 2017-09-02 DIAGNOSIS — M9903 Segmental and somatic dysfunction of lumbar region: Secondary | ICD-10-CM | POA: Diagnosis not present

## 2017-09-06 DIAGNOSIS — M5416 Radiculopathy, lumbar region: Secondary | ICD-10-CM | POA: Diagnosis not present

## 2017-09-06 DIAGNOSIS — M9901 Segmental and somatic dysfunction of cervical region: Secondary | ICD-10-CM | POA: Diagnosis not present

## 2017-09-06 DIAGNOSIS — M62830 Muscle spasm of back: Secondary | ICD-10-CM | POA: Diagnosis not present

## 2017-09-06 DIAGNOSIS — M9903 Segmental and somatic dysfunction of lumbar region: Secondary | ICD-10-CM | POA: Diagnosis not present

## 2017-09-15 ENCOUNTER — Encounter: Payer: Self-pay | Admitting: Family Medicine

## 2017-09-15 ENCOUNTER — Ambulatory Visit (INDEPENDENT_AMBULATORY_CARE_PROVIDER_SITE_OTHER): Payer: Medicare Other | Admitting: Family Medicine

## 2017-09-15 VITALS — BP 112/68 | HR 86 | Temp 97.8°F | Wt 164.0 lb

## 2017-09-15 DIAGNOSIS — F419 Anxiety disorder, unspecified: Secondary | ICD-10-CM

## 2017-09-15 DIAGNOSIS — H9201 Otalgia, right ear: Secondary | ICD-10-CM | POA: Diagnosis not present

## 2017-09-15 MED ORDER — SERTRALINE HCL 50 MG PO TABS: 50.0000 mg | ORAL_TABLET | Freq: Every day | ORAL | 3 refills | Status: DC

## 2017-09-15 NOTE — Progress Notes (Signed)
Patient: Tracy Silva Female    DOB: 1940-07-01   76 y.o.   MRN: [id number] Visit Date: 09/15/2017  Today's Provider: Wilhemena Durie, MD   Chief Complaint  Patient presents with  . Ear Pain    Right ear; started last night.  Pt reports it has improved since.   . Anxiety   Subjective:    Otalgia   There is pain in the right ear. This is a new problem. The current episode started yesterday. The problem has been gradually improving. Pertinent negatives include no abdominal pain, coughing, diarrhea, ear discharge, headaches, hearing loss, neck pain, rash, rhinorrhea, sore throat or vomiting. She has tried acetaminophen for the symptoms. The treatment provided significant relief.  Anxiety  Presents for follow-up visit. Symptoms include muscle tension and nervous/anxious behavior. Patient reports no confusion, decreased concentration, depressed mood, dizziness, excessive worry, insomnia, panic, shortness of breath or suicidal ideas. The quality of sleep is good.     GAD 7 : Generalized Anxiety Score 09/15/2017  Nervous, Anxious, on Edge 1  Control/stop worrying 0  Worry too much - different things 0  Trouble relaxing 2  Restless 3  Easily annoyed or irritable 1  Afraid - awful might happen 0  Total GAD 7 Score 7  Anxiety Difficulty Not difficult at all     Office Visit from 09/15/2017 in [place]  PHQ-9 Total Score  4          Allergies  Allergen Reactions  . Codeine Other (See Comments)    Headaches Heart palpations  . Statins     Muscle pain, joint pain  . Adhesive [Tape] Rash    FOR  LONG PERIODS OF TIME FOR  LONG PERIODS OF TIME     Current Outpatient Medications:  .  amLODipine-benazepril (LOTREL) 5-10 MG capsule, TAKE 1 CAPSULE BY MOUTH DAILY FOR BLOOD PRESSURE, Disp: 90 capsule, Rfl: 3 .  ezetimibe (ZETIA) 10 MG tablet, Take 1 tablet (10 mg total) by mouth every morning., Disp: 90 tablet, Rfl: 3 .  glipiZIDE (GLUCOTROL) 5 MG  tablet, Take 1 tablet (5 mg total) by mouth daily before breakfast., Disp: 90 tablet, Rfl: 3 .  hydrochlorothiazide (MICROZIDE) 12.5 MG capsule, Take 1 capsule (12.5 mg total) by mouth daily., Disp: 90 capsule, Rfl: 3 .  levothyroxine (SYNTHROID, LEVOTHROID) 112 MCG tablet, TAKE 1 TABLET EVERY DAY ON AN EMPTY STOMACH WITH A GLASS OF WATER AT LEAST 30-60 MINUTES BEFORE BREAKFAST FOR THYROID, Disp: 90 tablet, Rfl: 3 .  metoprolol succinate (TOPROL-XL) 50 MG 24 hr tablet, Take 1 tablet (50 mg total) by mouth daily. Take with or immediately following a meal., Disp: 90 tablet, Rfl: 3 .  Multiple Vitamins-Calcium (ONE-A-DAY WOMENS PO), Take by mouth daily., Disp: , Rfl:  .  omeprazole (PRILOSEC OTC) 20 MG tablet, Take 20 mg by mouth daily., Disp: , Rfl:  .  pioglitazone (ACTOS) 45 MG tablet, Take 1 tablet (45 mg total) by mouth daily., Disp: 90 tablet, Rfl: 3 .  ferrous sulfate 325 (65 FE) MG tablet, Take 325 mg by mouth daily with breakfast., Disp: , Rfl:   Review of Systems  Constitutional: Negative.   HENT: Positive for ear pain. Negative for congestion, ear discharge, hearing loss, rhinorrhea, sinus pressure, sinus pain, sore throat, tinnitus, trouble swallowing and voice change.   Eyes: Negative.   Respiratory: Negative.  Negative for cough and shortness of breath.   Gastrointestinal: Negative.  Negative for abdominal pain,  diarrhea and vomiting.  Endocrine: Negative.   Musculoskeletal: Negative for neck pain and neck stiffness.  Skin: Negative for rash.  Allergic/Immunologic: Negative.   Neurological: Negative for dizziness, weakness, light-headedness, numbness and headaches.  Psychiatric/Behavioral: Negative for agitation, behavioral problems, confusion, decreased concentration, dysphoric mood, hallucinations, self-injury, sleep disturbance and suicidal ideas. The patient is nervous/anxious. The patient does not have insomnia and is not hyperactive.     Social History   Tobacco Use  .  Smoking status: Former Smoker    Packs/day: 0.50    Years: 4.00    Pack years: 2.00    Last attempt to quit: 01/26/1971    Years since quitting: 46.6  . Smokeless tobacco: Never Used  Substance Use Topics  . Alcohol use: Yes    Comment: occasionally with dinner   Objective:   BP 112/68 (BP Location: Right Arm, Patient Position: Sitting, Cuff Size: Normal)   Pulse 86   Temp 97.8 F (36.6 C) (Oral)   Wt 164 lb (74.4 kg)   SpO2 95%   BMI 29.05 kg/m  Vitals:   09/15/17 1608  BP: 112/68  Pulse: 86  Temp: 97.8 F (36.6 C)  TempSrc: Oral  SpO2: 95%  Weight: 164 lb (74.4 kg)     Physical Exam  Constitutional: She is oriented to person, place, and time. She appears well-developed and well-nourished.  HENT:  Head: Normocephalic and atraumatic.  Eyes: Conjunctivae are normal.  Neck: No thyromegaly present.  Cardiovascular: Normal rate and regular rhythm.  Murmur heard. Harsh RUSB murmur.  Pulmonary/Chest: Effort normal and breath sounds normal.  Abdominal: Soft.  Musculoskeletal: She exhibits no edema.  Neurological: She is alert and oriented to person, place, and time.  Skin: Skin is warm and dry.  Psychiatric: She has a normal mood and affect. Her behavior is normal. Judgment and thought content normal.        Assessment & Plan:     1. Anxiety Having significant marital issues.RTC 1 month. - sertraline (ZOLOFT) 50 MG tablet; Take 1 tablet (50 mg total) by mouth daily.  Dispense: 30 tablet; Refill: 3 2.Ear Pain/OM Improving.     I have done the exam and reviewed the above chart and it is accurate to the best of my knowledge. Development worker, community has been used in this note in any air is in the dictation or transcription are unintentional.  Wilhemena Durie, MD  [place]

## 2017-09-28 ENCOUNTER — Ambulatory Visit: Payer: Self-pay | Admitting: Family Medicine

## 2017-09-30 DIAGNOSIS — M9901 Segmental and somatic dysfunction of cervical region: Secondary | ICD-10-CM | POA: Diagnosis not present

## 2017-09-30 DIAGNOSIS — M62830 Muscle spasm of back: Secondary | ICD-10-CM | POA: Diagnosis not present

## 2017-09-30 DIAGNOSIS — M5416 Radiculopathy, lumbar region: Secondary | ICD-10-CM | POA: Diagnosis not present

## 2017-09-30 DIAGNOSIS — M9903 Segmental and somatic dysfunction of lumbar region: Secondary | ICD-10-CM | POA: Diagnosis not present

## 2017-10-18 ENCOUNTER — Ambulatory Visit: Payer: Medicare Other | Admitting: Family Medicine

## 2017-10-21 ENCOUNTER — Ambulatory Visit (INDEPENDENT_AMBULATORY_CARE_PROVIDER_SITE_OTHER): Payer: Medicare Other | Admitting: Family Medicine

## 2017-10-21 ENCOUNTER — Encounter: Payer: Self-pay | Admitting: Family Medicine

## 2017-10-21 VITALS — BP 130/68 | HR 94 | Temp 99.2°F | Resp 16 | Ht 63.0 in | Wt 174.0 lb

## 2017-10-21 DIAGNOSIS — F329 Major depressive disorder, single episode, unspecified: Secondary | ICD-10-CM

## 2017-10-21 DIAGNOSIS — I10 Essential (primary) hypertension: Secondary | ICD-10-CM

## 2017-10-21 DIAGNOSIS — E7849 Other hyperlipidemia: Secondary | ICD-10-CM | POA: Diagnosis not present

## 2017-10-21 DIAGNOSIS — F32A Depression, unspecified: Secondary | ICD-10-CM

## 2017-10-21 DIAGNOSIS — E1169 Type 2 diabetes mellitus with other specified complication: Secondary | ICD-10-CM

## 2017-10-21 DIAGNOSIS — E669 Obesity, unspecified: Secondary | ICD-10-CM

## 2017-10-21 DIAGNOSIS — I351 Nonrheumatic aortic (valve) insufficiency: Secondary | ICD-10-CM

## 2017-10-21 DIAGNOSIS — C20 Malignant neoplasm of rectum: Secondary | ICD-10-CM

## 2017-10-21 DIAGNOSIS — E6609 Other obesity due to excess calories: Secondary | ICD-10-CM

## 2017-10-21 DIAGNOSIS — Z6830 Body mass index (BMI) 30.0-30.9, adult: Secondary | ICD-10-CM

## 2017-10-21 DIAGNOSIS — F419 Anxiety disorder, unspecified: Secondary | ICD-10-CM

## 2017-10-21 NOTE — Progress Notes (Signed)
Patient: Tracy Silva Female    DOB: 1940/11/07   77 y.o.   MRN: [id number] Visit Date: 10/21/2017  Today's Provider: Wilhemena Durie, MD   Chief Complaint  Patient presents with  . Anxiety   Subjective:    HPI   Patient comes in today for a follow up. She was last seen in the office 1 month ago. She was advised to start Sertraline 50mg  daily to help with her anxiety and depression. Patient reports that she did not start the medication. She reports that she has used daily prayer and meditation to help cope with her depression. She reports if her symptoms get "really bad", she will start taking the Sertraline. She says she is now feeling fine.  It is time for lab work. She is taking her meds.    Allergies  Allergen Reactions  . Codeine Other (See Comments)    Headaches Heart palpations  . Statins     Muscle pain, joint pain  . Adhesive [Tape] Rash    FOR  LONG PERIODS OF TIME FOR  LONG PERIODS OF TIME     Current Outpatient Medications:  .  amLODipine-benazepril (LOTREL) 5-10 MG capsule, TAKE 1 CAPSULE BY MOUTH DAILY FOR BLOOD PRESSURE, Disp: 90 capsule, Rfl: 3 .  ezetimibe (ZETIA) 10 MG tablet, Take 1 tablet (10 mg total) by mouth every morning., Disp: 90 tablet, Rfl: 3 .  ferrous sulfate 325 (65 FE) MG tablet, Take 325 mg by mouth daily with breakfast., Disp: , Rfl:  .  glipiZIDE (GLUCOTROL) 5 MG tablet, Take 1 tablet (5 mg total) by mouth daily before breakfast., Disp: 90 tablet, Rfl: 3 .  hydrochlorothiazide (MICROZIDE) 12.5 MG capsule, Take 1 capsule (12.5 mg total) by mouth daily., Disp: 90 capsule, Rfl: 3 .  levothyroxine (SYNTHROID, LEVOTHROID) 112 MCG tablet, TAKE 1 TABLET EVERY DAY ON AN EMPTY STOMACH WITH A GLASS OF WATER AT LEAST 30-60 MINUTES BEFORE BREAKFAST FOR THYROID, Disp: 90 tablet, Rfl: 3 .  metoprolol succinate (TOPROL-XL) 50 MG 24 hr tablet, Take 1 tablet (50 mg total) by mouth daily. Take with or immediately following a meal., Disp: 90  tablet, Rfl: 3 .  Multiple Vitamins-Calcium (ONE-A-DAY WOMENS PO), Take by mouth daily., Disp: , Rfl:  .  omeprazole (PRILOSEC OTC) 20 MG tablet, Take 20 mg by mouth daily., Disp: , Rfl:  .  pioglitazone (ACTOS) 45 MG tablet, Take 1 tablet (45 mg total) by mouth daily., Disp: 90 tablet, Rfl: 3 .  sertraline (ZOLOFT) 50 MG tablet, Take 1 tablet (50 mg total) by mouth daily., Disp: 30 tablet, Rfl: 3  Review of Systems  Constitutional: Positive for fatigue. Negative for activity change, appetite change, chills, fever and unexpected weight change.  HENT: Negative.   Eyes: Negative.   Respiratory: Negative for cough and shortness of breath.   Cardiovascular: Negative for chest pain, palpitations and leg swelling.  Gastrointestinal: Negative.   Endocrine: Negative.   Skin: Negative.   Allergic/Immunologic: Negative.   Neurological: Negative for dizziness and headaches.  Psychiatric/Behavioral: Positive for decreased concentration. Negative for agitation, behavioral problems, confusion, hallucinations, self-injury, sleep disturbance and suicidal ideas. The patient is nervous/anxious. The patient is not hyperactive.     Social History   Tobacco Use  . Smoking status: Former Smoker    Packs/day: 0.50    Years: 4.00    Pack years: 2.00    Last attempt to quit: 01/26/1971    Years since quitting: 46.7  .  Smokeless tobacco: Never Used  Substance Use Topics  . Alcohol use: Yes    Comment: occasionally with dinner   Objective:   BP 130/68 (BP Location: Left Arm, Patient Position: Sitting, Cuff Size: Normal)   Pulse 94   Temp 99.2 F (37.3 C)   Resp 16   Ht 5\' 3"  (1.6 m)   Wt 174 lb (78.9 kg)   SpO2 96%   BMI 30.82 kg/m  Vitals:   10/21/17 1426  BP: 130/68  Pulse: 94  Resp: 16  Temp: 99.2 F (37.3 C)  SpO2: 96%  Weight: 174 lb (78.9 kg)  Height: 5\' 3"  (1.6 m)     Physical Exam  Constitutional: She is oriented to person, place, and time. She appears well-developed and  well-nourished.  HENT:  Head: Normocephalic and atraumatic.  Right Ear: External ear normal.  Left Ear: External ear normal.  Nose: Nose normal.  Eyes: Conjunctivae are normal. No scleral icterus.  Neck: No thyromegaly present.  Cardiovascular: Normal rate and regular rhythm.  Murmur heard. 2/6 systolic murmur. Varicose veins noted.  Pulmonary/Chest: Effort normal and breath sounds normal.  Abdominal: Soft.  Musculoskeletal: She exhibits no edema.  Neurological: She is alert and oriented to person, place, and time.  Skin: Skin is warm and dry.  Psychiatric: She has a normal mood and affect. Her behavior is normal. Judgment and thought content normal.        Assessment & Plan:        1. Diabetes mellitus type 2 in obese (HCC)  - Hemoglobin A1c  2. Essential (primary) hypertension  - Comprehensive metabolic panel  3. Anxiety  - CBC with Differential/Platelet - TSH  4. Depression, unspecified depression type Pt claims she now feels fine. Trouble dealing with husband. - CBC with Differential/Platelet  5. Other hyperlipidemia  - Lipid panel  6. Aortic valve insufficiency, etiology of cardiac valve disease unspecified   7. Rectal cancer (HCC)   8. Class 1 obesity due to excess calories with serious comorbidity and body mass index (BMI) of 30.0 to 30.9 in adult With DM/HLD.   I have done the exam and reviewed the above chart and it is accurate to the best of my knowledge. Development worker, community has been used in this note in any air is in the dictation or transcription are unintentional.  Wilhemena Durie, MD  [facility]

## 2017-10-26 DIAGNOSIS — I10 Essential (primary) hypertension: Secondary | ICD-10-CM | POA: Diagnosis not present

## 2017-10-26 DIAGNOSIS — E1169 Type 2 diabetes mellitus with other specified complication: Secondary | ICD-10-CM | POA: Diagnosis not present

## 2017-10-27 DIAGNOSIS — M62830 Muscle spasm of back: Secondary | ICD-10-CM | POA: Diagnosis not present

## 2017-10-27 DIAGNOSIS — M9901 Segmental and somatic dysfunction of cervical region: Secondary | ICD-10-CM | POA: Diagnosis not present

## 2017-10-27 DIAGNOSIS — M9903 Segmental and somatic dysfunction of lumbar region: Secondary | ICD-10-CM | POA: Diagnosis not present

## 2017-10-27 DIAGNOSIS — M5416 Radiculopathy, lumbar region: Secondary | ICD-10-CM | POA: Diagnosis not present

## 2017-10-27 LAB — COMPREHENSIVE METABOLIC PANEL
A/G RATIO: 1.9 (ref 1.2–2.2)
ALK PHOS: 78 IU/L (ref 39–117)
ALT: 13 IU/L (ref 0–32)
AST: 19 IU/L (ref 0–40)
Albumin: 4.2 g/dL (ref 3.5–4.8)
BILIRUBIN TOTAL: 0.2 mg/dL (ref 0.0–1.2)
BUN/Creatinine Ratio: 17 (ref 12–28)
BUN: 31 mg/dL — ABNORMAL HIGH (ref 8–27)
CHLORIDE: 107 mmol/L — AB (ref 96–106)
CO2: 19 mmol/L — ABNORMAL LOW (ref 20–29)
Calcium: 9.6 mg/dL (ref 8.7–10.3)
Creatinine, Ser: 1.8 mg/dL — ABNORMAL HIGH (ref 0.57–1.00)
GFR calc Af Amer: 30 mL/min/{1.73_m2} — ABNORMAL LOW (ref >59)
GFR, EST NON AFRICAN AMERICAN: 26 mL/min/{1.73_m2} — AB (ref >59)
GLOBULIN, TOTAL: 2.2 g/dL (ref 1.5–4.5)
Glucose: 64 mg/dL — ABNORMAL LOW (ref 65–99)
POTASSIUM: 4 mmol/L (ref 3.5–5.2)
SODIUM: 143 mmol/L (ref 134–144)
Total Protein: 6.4 g/dL (ref 6.0–8.5)

## 2017-10-27 LAB — LIPID PANEL
CHOL/HDL RATIO: 4.9 ratio — AB (ref 0.0–4.4)
CHOLESTEROL TOTAL: 231 mg/dL — AB (ref 100–199)
HDL: 47 mg/dL (ref >39)
LDL Calculated: 125 mg/dL — ABNORMAL HIGH (ref 0–99)
TRIGLYCERIDES: 293 mg/dL — AB (ref 0–149)
VLDL Cholesterol Cal: 59 mg/dL — ABNORMAL HIGH (ref 5–40)

## 2017-10-27 LAB — CBC WITH DIFFERENTIAL/PLATELET
Basophils Absolute: 0 10*3/uL (ref 0.0–0.2)
Basos: 1 %
EOS (ABSOLUTE): 0.2 10*3/uL (ref 0.0–0.4)
EOS: 6 %
HEMATOCRIT: 32.3 % — AB (ref 34.0–46.6)
Hemoglobin: 10.7 g/dL — ABNORMAL LOW (ref 11.1–15.9)
Immature Grans (Abs): 0 10*3/uL (ref 0.0–0.1)
Immature Granulocytes: 0 %
LYMPHS ABS: 1.3 10*3/uL (ref 0.7–3.1)
Lymphs: 35 %
MCH: 33.4 pg — AB (ref 26.6–33.0)
MCHC: 33.1 g/dL (ref 31.5–35.7)
MCV: 101 fL — AB (ref 79–97)
MONOCYTES: 13 %
MONOS ABS: 0.5 10*3/uL (ref 0.1–0.9)
Neutrophils Absolute: 1.8 10*3/uL (ref 1.4–7.0)
Neutrophils: 45 %
Platelets: 238 10*3/uL (ref 150–450)
RBC: 3.2 x10E6/uL — ABNORMAL LOW (ref 3.77–5.28)
RDW: 14.3 % (ref 12.3–15.4)
WBC: 3.8 10*3/uL (ref 3.4–10.8)

## 2017-10-27 LAB — HEMOGLOBIN A1C
Est. average glucose Bld gHb Est-mCnc: 140 mg/dL
HEMOGLOBIN A1C: 6.5 % — AB (ref 4.8–5.6)

## 2017-10-27 LAB — TSH: TSH: 0.18 u[IU]/mL — ABNORMAL LOW (ref 0.450–4.500)

## 2017-10-28 ENCOUNTER — Telehealth: Payer: Self-pay

## 2017-10-28 NOTE — Telephone Encounter (Signed)
-----   Message from Jerrol Banana., MD sent at 10/28/2017  4:08 PM EDT ----- Kidney function slightly worse--would refer to nephrology.

## 2017-10-28 NOTE — Telephone Encounter (Signed)
Patient advised. She declined a Nephrology referral at this time. She prefers to schedule a appointment to discuss with Dr. Rosanna Randy. Patient states she will call back to scheduled that appointment.

## 2017-11-03 ENCOUNTER — Ambulatory Visit (INDEPENDENT_AMBULATORY_CARE_PROVIDER_SITE_OTHER): Payer: Medicare Other | Admitting: Family Medicine

## 2017-11-03 ENCOUNTER — Ambulatory Visit: Payer: Self-pay

## 2017-11-03 VITALS — BP 130/60 | HR 80 | Temp 98.0°F | Resp 16 | Wt 175.0 lb

## 2017-11-03 DIAGNOSIS — E1169 Type 2 diabetes mellitus with other specified complication: Secondary | ICD-10-CM

## 2017-11-03 DIAGNOSIS — E669 Obesity, unspecified: Secondary | ICD-10-CM

## 2017-11-03 DIAGNOSIS — N183 Chronic kidney disease, stage 3 unspecified: Secondary | ICD-10-CM

## 2017-11-03 DIAGNOSIS — Z23 Encounter for immunization: Secondary | ICD-10-CM

## 2017-11-03 DIAGNOSIS — C186 Malignant neoplasm of descending colon: Secondary | ICD-10-CM | POA: Diagnosis not present

## 2017-11-03 DIAGNOSIS — I10 Essential (primary) hypertension: Secondary | ICD-10-CM

## 2017-11-03 DIAGNOSIS — C20 Malignant neoplasm of rectum: Secondary | ICD-10-CM | POA: Diagnosis not present

## 2017-11-03 MED ORDER — LEVOTHYROXINE SODIUM 75 MCG PO TABS: ORAL_TABLET | ORAL | 12 refills | Status: DC

## 2017-11-03 NOTE — Progress Notes (Signed)
Tracy Silva  MRN: [id number] DOB: 04-08-1940  Subjective:  HPI   The patient is a 77 year old female who presents for discussion of hier lab results.  Patient is concerned about the kidney function.  She states she did not know she had any problems with the kidneys and would like to have more information about kidney disease.   Patient Active Problem List   Diagnosis Date Noted  . Colon cancer ([place]) 08/21/2016  . Rectal cancer ([place]) 07/30/2016  . Cancer of descending colon ([place]) 07/30/2016  . LBBB (left bundle branch block) 07/24/2016  . Heart murmur, systolic 77/82/4235  . Acid reflux 09/07/2014  . AI (aortic incompetence) 09/07/2014  . Allergic rhinitis 09/07/2014  . Clinical depression 09/07/2014  . Diabetes mellitus type 2 in obese ([place]) 09/07/2014  . Essential (primary) hypertension 09/07/2014  . H/O thyroid disease 09/07/2014  . HLD (hyperlipidemia) 09/07/2014  . Acquired hypothyroidism 09/07/2014  . Adiposity 09/07/2014  . Post menopausal syndrome 09/07/2014  . Basal cell papilloma 09/07/2014  . Avitaminosis D 09/07/2014    Past Medical History:  Diagnosis Date  . Acid reflux   . Anemia    H/O AS A CHILD  . Arthritis    FINGERS AND SHOULDER-RIGHT  . Asthma    AS A CHILD  . Diabetes mellitus without complication ([place])   . Heart murmur    ASYMPTOMATIC  . High cholesterol   . Hypertension   . Hypothyroidism   . Other and unspecified noninfectious gastroenteritis and colitis(558.9) 06/27/2012  . Rectal bleeding 2011  . Rectal cancer ([place]) 07/16/2016    Social History   Socioeconomic History  . Marital status: Married    Spouse name: Not on file  . Number of children: Not on file  . Years of education: Not on file  . Highest education level: Not on file  Occupational History  . Not on file  Social Needs  . Financial resource strain: Not on file  . Food insecurity:    Worry: Not on file    Inability: Not on file  . Transportation needs:   Medical: Not on file    Non-medical: Not on file  Tobacco Use  . Smoking status: Former Smoker    Packs/day: 0.50    Years: 4.00    Pack years: 2.00    Last attempt to quit: 01/26/1971    Years since quitting: 46.8  . Smokeless tobacco: Never Used  Substance and Sexual Activity  . Alcohol use: Yes    Comment: occasionally with dinner  . Drug use: No  . Sexual activity: Not on file  Lifestyle  . Physical activity:    Days per week: Not on file    Minutes per session: Not on file  . Stress: Not on file  Relationships  . Social connections:    Talks on phone: Not on file    Gets together: Not on file    Attends religious service: Not on file    Active member of club or organization: Not on file    Attends meetings of clubs or organizations: Not on file    Relationship status: Not on file  . Intimate partner violence:    Fear of current or ex partner: Not on file    Emotionally abused: Not on file    Physically abused: Not on file    Forced sexual activity: Not on file  Other Topics Concern  . Not on file  Social History Narrative  .  Not on file    Outpatient Encounter Medications as of 11/03/2017  Medication Sig  . amLODipine-benazepril (LOTREL) 5-10 MG capsule TAKE 1 CAPSULE BY MOUTH DAILY FOR BLOOD PRESSURE  . ezetimibe (ZETIA) 10 MG tablet Take 1 tablet (10 mg total) by mouth every morning.  . ferrous sulfate 325 (65 FE) MG tablet Take 325 mg by mouth daily with breakfast.  . glipiZIDE (GLUCOTROL) 5 MG tablet Take 1 tablet (5 mg total) by mouth daily before breakfast.  . hydrochlorothiazide (MICROZIDE) 12.5 MG capsule Take 1 capsule (12.5 mg total) by mouth daily.  Marland Kitchen levothyroxine (SYNTHROID, LEVOTHROID) 112 MCG tablet TAKE 1 TABLET EVERY DAY ON AN EMPTY STOMACH WITH A GLASS OF WATER AT LEAST 30-60 MINUTES BEFORE BREAKFAST FOR THYROID  . metoprolol succinate (TOPROL-XL) 50 MG 24 hr tablet Take 1 tablet (50 mg total) by mouth daily. Take with or immediately following a  meal.  . Multiple Vitamins-Calcium (ONE-A-DAY WOMENS PO) Take by mouth daily.  Marland Kitchen omeprazole (PRILOSEC OTC) 20 MG tablet Take 20 mg by mouth daily.  . pioglitazone (ACTOS) 45 MG tablet Take 1 tablet (45 mg total) by mouth daily.  . sertraline (ZOLOFT) 50 MG tablet Take 1 tablet (50 mg total) by mouth daily.   No facility-administered encounter medications on file as of 11/03/2017.     Allergies  Allergen Reactions  . Codeine Other (See Comments)    Headaches Heart palpations  . Statins     Muscle pain, joint pain  . Adhesive [Tape] Rash    FOR  LONG PERIODS OF TIME FOR  LONG PERIODS OF TIME    Review of Systems  Constitutional: Negative for fever and malaise/fatigue.  HENT: Negative.   Eyes: Negative.   Respiratory: Negative for cough, shortness of breath and wheezing.   Cardiovascular: Negative for chest pain, palpitations, orthopnea, claudication and leg swelling.  Gastrointestinal: Negative.   Genitourinary: Negative.   Skin: Negative.   Endo/Heme/Allergies: Negative.   Psychiatric/Behavioral: Negative.     Objective:  BP 130/60 (BP Location: Right Arm, Patient Position: Sitting, Cuff Size: Normal)   Pulse 80   Temp 98 F (36.7 C) (Oral)   Resp 16   Wt 175 lb (79.4 kg)   BMI 31.00 kg/m   Physical Exam  Constitutional: She is oriented to person, place, and time and well-developed, well-nourished, and in no distress.  HENT:  Head: Normocephalic and atraumatic.  Right Ear: External ear normal.  Left Ear: External ear normal.  Nose: Nose normal.  Eyes: Conjunctivae are normal. No scleral icterus.  Neck: No thyromegaly present.  Cardiovascular: Normal rate, regular rhythm and normal heart sounds.  Pulmonary/Chest: Effort normal and breath sounds normal.  Abdominal: Soft.  Musculoskeletal: She exhibits no edema.  Neurological: She is alert and oriented to person, place, and time. Gait normal. GCS score is 15.  Skin: Skin is warm and dry.  Psychiatric: Mood,  memory, affect and judgment normal.    Assessment and Plan :   1. Need for influenza vaccination  - Flu vaccine HIGH DOSE PF (Fluzone High dose)  2. Essential (primary) hypertension   3. Rectal cancer ([place])   4. Cancer of descending colon ([place])   5. Diabetes mellitus type 2 in obese ([place])   6. CKD (chronic kidney disease) stage 3, GFR 30-59 ml/min (HCC)  I have done the exam and reviewed the chart and it is accurate to the best of my knowledge. Development worker, community has been used and  any errors in dictation  or transcription are unintentional. Miguel Aschoff M.D. [facility]

## 2017-11-03 NOTE — Patient Instructions (Signed)
DASH Eating Plan DASH stands for "Dietary Approaches to Stop Hypertension." The DASH eating plan is a healthy eating plan that has been shown to reduce high blood pressure (hypertension). It may also reduce your risk for type 2 diabetes, heart disease, and stroke. The DASH eating plan may also help with weight loss. What are tips for following this plan? General guidelines  Avoid eating more than 2,300 mg (milligrams) of salt (sodium) a day. If you have hypertension, you may need to reduce your sodium intake to 1,500 mg a day.  Limit alcohol intake to no more than 1 drink a day for nonpregnant women and 2 drinks a day for men. One drink equals 12 oz of beer, 5 oz of wine, or 1 oz of hard liquor.  Work with your health care provider to maintain a healthy body weight or to lose weight. Ask what an ideal weight is for you.  Get at least 30 minutes of exercise that causes your heart to beat faster (aerobic exercise) most days of the week. Activities may include walking, swimming, or biking.  Work with your health care provider or diet and nutrition specialist (dietitian) to adjust your eating plan to your individual calorie needs. Reading food labels  Check food labels for the amount of sodium per serving. Choose foods with less than 5 percent of the Daily Value of sodium. Generally, foods with less than 300 mg of sodium per serving fit into this eating plan.  To find whole grains, look for the word "whole" as the first word in the ingredient list. Shopping  Buy products labeled as "low-sodium" or "no salt added."  Buy fresh foods. Avoid canned foods and premade or frozen meals. Cooking  Avoid adding salt when cooking. Use salt-free seasonings or herbs instead of table salt or sea salt. Check with your health care provider or pharmacist before using salt substitutes.  Do not fry foods. Cook foods using healthy methods such as baking, boiling, grilling, and broiling instead.  Cook with  heart-healthy oils, such as olive, canola, soybean, or sunflower oil. Meal planning   Eat a balanced diet that includes: ? 5 or more servings of fruits and vegetables each day. At each meal, try to fill half of your plate with fruits and vegetables. ? Up to 6-8 servings of whole grains each day. ? Less than 6 oz of lean meat, poultry, or fish each day. A 3-oz serving of meat is about the same size as a deck of cards. One egg equals 1 oz. ? 2 servings of low-fat dairy each day. ? A serving of nuts, seeds, or beans 5 times each week. ? Heart-healthy fats. Healthy fats called Omega-3 fatty acids are found in foods such as flaxseeds and coldwater fish, like sardines, salmon, and mackerel.  Limit how much you eat of the following: ? Canned or prepackaged foods. ? Food that is high in trans fat, such as fried foods. ? Food that is high in saturated fat, such as fatty meat. ? Sweets, desserts, sugary drinks, and other foods with added sugar. ? Full-fat dairy products.  Do not salt foods before eating.  Try to eat at least 2 vegetarian meals each week.  Eat more home-cooked food and less restaurant, buffet, and fast food.  When eating at a restaurant, ask that your food be prepared with less salt or no salt, if possible. What foods are recommended? The items listed may not be a complete list. Talk with your dietitian about what   dietary choices are best for you. Grains Whole-grain or whole-wheat bread. Whole-grain or whole-wheat pasta. Brown rice. Oatmeal. Quinoa. Bulgur. Whole-grain and low-sodium cereals. Pita bread. Low-fat, low-sodium crackers. Whole-wheat flour tortillas. Vegetables Fresh or frozen vegetables (raw, steamed, roasted, or grilled). Low-sodium or reduced-sodium tomato and vegetable juice. Low-sodium or reduced-sodium tomato sauce and tomato paste. Low-sodium or reduced-sodium canned vegetables. Fruits All fresh, dried, or frozen fruit. Canned fruit in natural juice (without  added sugar). Meat and other protein foods Skinless chicken or turkey. Ground chicken or turkey. Pork with fat trimmed off. Fish and seafood. Egg whites. Dried beans, peas, or lentils. Unsalted nuts, nut butters, and seeds. Unsalted canned beans. Lean cuts of beef with fat trimmed off. Low-sodium, lean deli meat. Dairy Low-fat (1%) or fat-free (skim) milk. Fat-free, low-fat, or reduced-fat cheeses. Nonfat, low-sodium ricotta or cottage cheese. Low-fat or nonfat yogurt. Low-fat, low-sodium cheese. Fats and oils Soft margarine without trans fats. Vegetable oil. Low-fat, reduced-fat, or light mayonnaise and salad dressings (reduced-sodium). Canola, safflower, olive, soybean, and sunflower oils. Avocado. Seasoning and other foods Herbs. Spices. Seasoning mixes without salt. Unsalted popcorn and pretzels. Fat-free sweets. What foods are not recommended? The items listed may not be a complete list. Talk with your dietitian about what dietary choices are best for you. Grains Baked goods made with fat, such as croissants, muffins, or some breads. Dry pasta or rice meal packs. Vegetables Creamed or fried vegetables. Vegetables in a cheese sauce. Regular canned vegetables (not low-sodium or reduced-sodium). Regular canned tomato sauce and paste (not low-sodium or reduced-sodium). Regular tomato and vegetable juice (not low-sodium or reduced-sodium). Pickles. Olives. Fruits Canned fruit in a light or heavy syrup. Fried fruit. Fruit in cream or butter sauce. Meat and other protein foods Fatty cuts of meat. Ribs. Fried meat. Bacon. Sausage. Bologna and other processed lunch meats. Salami. Fatback. Hotdogs. Bratwurst. Salted nuts and seeds. Canned beans with added salt. Canned or smoked fish. Whole eggs or egg yolks. Chicken or turkey with skin. Dairy Whole or 2% milk, cream, and half-and-half. Whole or full-fat cream cheese. Whole-fat or sweetened yogurt. Full-fat cheese. Nondairy creamers. Whipped toppings.  Processed cheese and cheese spreads. Fats and oils Butter. Stick margarine. Lard. Shortening. Ghee. Bacon fat. Tropical oils, such as coconut, palm kernel, or palm oil. Seasoning and other foods Salted popcorn and pretzels. Onion salt, garlic salt, seasoned salt, table salt, and sea salt. Worcestershire sauce. Tartar sauce. Barbecue sauce. Teriyaki sauce. Soy sauce, including reduced-sodium. Steak sauce. Canned and packaged gravies. Fish sauce. Oyster sauce. Cocktail sauce. Horseradish that you find on the shelf. Ketchup. Mustard. Meat flavorings and tenderizers. Bouillon cubes. Hot sauce and Tabasco sauce. Premade or packaged marinades. Premade or packaged taco seasonings. Relishes. Regular salad dressings. Where to find more information:  National Heart, Lung, and Blood Institute: www.nhlbi.nih.gov  American Heart Association: www.heart.org Summary  The DASH eating plan is a healthy eating plan that has been shown to reduce high blood pressure (hypertension). It may also reduce your risk for type 2 diabetes, heart disease, and stroke.  With the DASH eating plan, you should limit salt (sodium) intake to 2,300 mg a day. If you have hypertension, you may need to reduce your sodium intake to 1,500 mg a day.  When on the DASH eating plan, aim to eat more fresh fruits and vegetables, whole grains, lean proteins, low-fat dairy, and heart-healthy fats.  Work with your health care provider or diet and nutrition specialist (dietitian) to adjust your eating plan to your individual   calorie needs. This information is not intended to replace advice given to you by your health care provider. Make sure you discuss any questions you have with your health care provider. Document Released: 01/01/2011 Document Revised: 01/06/2016 Document Reviewed: 01/06/2016 Elsevier Interactive Patient Education  2018 Elsevier Inc.  

## 2017-11-23 ENCOUNTER — Telehealth: Payer: Self-pay | Admitting: Family Medicine

## 2017-11-23 NOTE — Telephone Encounter (Signed)
I left a message asking the pt to call me at [phone] to schedule AWV-S w/ NHA McKenzie. Last AWV 08/29/13. VDM (DD)

## 2017-12-09 DIAGNOSIS — M9901 Segmental and somatic dysfunction of cervical region: Secondary | ICD-10-CM | POA: Diagnosis not present

## 2017-12-09 DIAGNOSIS — M62830 Muscle spasm of back: Secondary | ICD-10-CM | POA: Diagnosis not present

## 2017-12-09 DIAGNOSIS — M9903 Segmental and somatic dysfunction of lumbar region: Secondary | ICD-10-CM | POA: Diagnosis not present

## 2017-12-09 DIAGNOSIS — M5416 Radiculopathy, lumbar region: Secondary | ICD-10-CM | POA: Diagnosis not present

## 2017-12-15 DIAGNOSIS — M9903 Segmental and somatic dysfunction of lumbar region: Secondary | ICD-10-CM | POA: Diagnosis not present

## 2017-12-15 DIAGNOSIS — M9901 Segmental and somatic dysfunction of cervical region: Secondary | ICD-10-CM | POA: Diagnosis not present

## 2017-12-15 DIAGNOSIS — M5416 Radiculopathy, lumbar region: Secondary | ICD-10-CM | POA: Diagnosis not present

## 2017-12-15 DIAGNOSIS — M62830 Muscle spasm of back: Secondary | ICD-10-CM | POA: Diagnosis not present

## 2017-12-21 DIAGNOSIS — M5416 Radiculopathy, lumbar region: Secondary | ICD-10-CM | POA: Diagnosis not present

## 2017-12-21 DIAGNOSIS — M9903 Segmental and somatic dysfunction of lumbar region: Secondary | ICD-10-CM | POA: Diagnosis not present

## 2017-12-21 DIAGNOSIS — M62830 Muscle spasm of back: Secondary | ICD-10-CM | POA: Diagnosis not present

## 2017-12-21 DIAGNOSIS — M9901 Segmental and somatic dysfunction of cervical region: Secondary | ICD-10-CM | POA: Diagnosis not present

## 2017-12-24 IMAGING — CT CT ABD-PELV W/ CM
1 of 3 series · 13 of 32 positions shown, 18 images · IV contrast (iopamidol)
Comparison: 03/13/2009

CLINICAL DATA: Left lower quadrant pain and bleeding, history of
biopsy-proven rectal carcinoma

EXAM:
CT ABDOMEN AND PELVIS WITH CONTRAST
TECHNIQUE: Multidetector CT imaging of the abdomen and pelvis was performed
using the standard protocol following bolus administration of
intravenous contrast.
CONTRAST:  75mL [id number] IOPAMIDOL ([id number]) INJECTION 61%

[Series 2: axial st · axial · 0.71mm/px · z∈[-994,-589]mm · 13 of 93 slices shown, 18 images]
[im 6/93  soft-tissue]
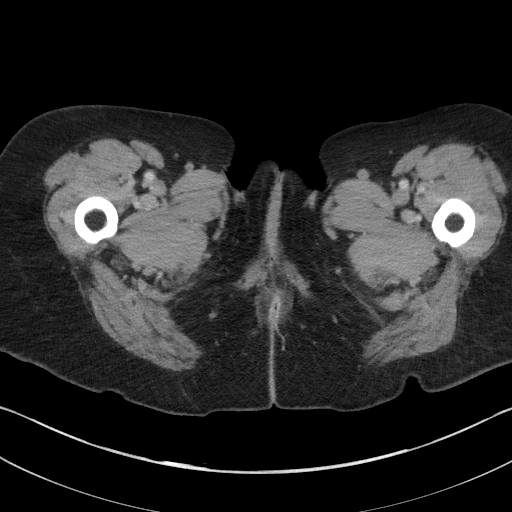
[im 6/93  bone]
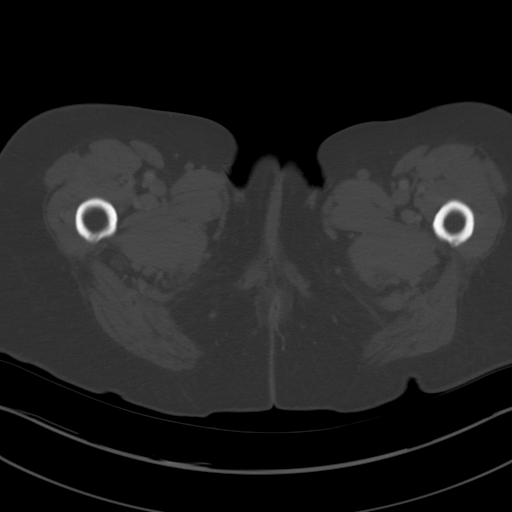
[im 16/93  soft-tissue]
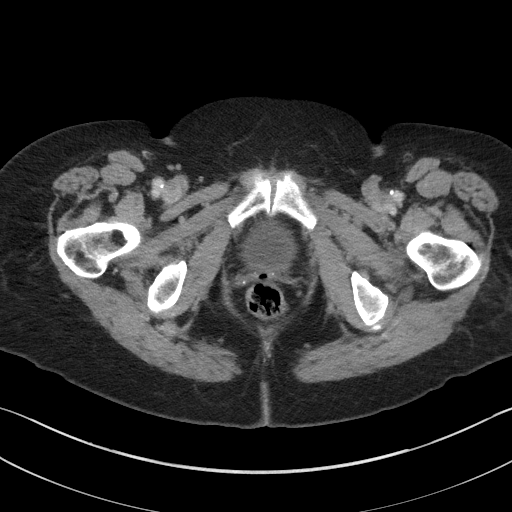
[im 21/93  soft-tissue]
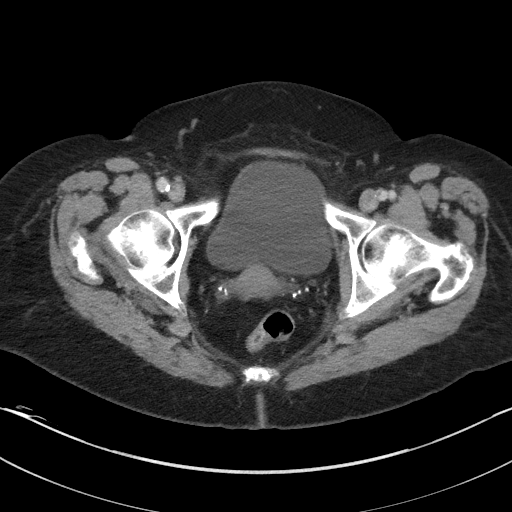
[im 26/93  soft-tissue]
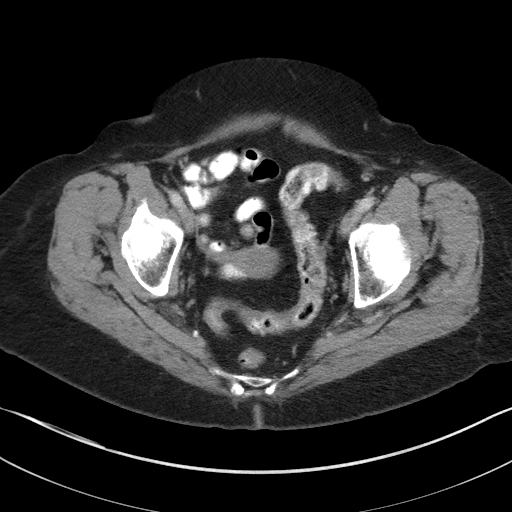
[im 36/93  soft-tissue]
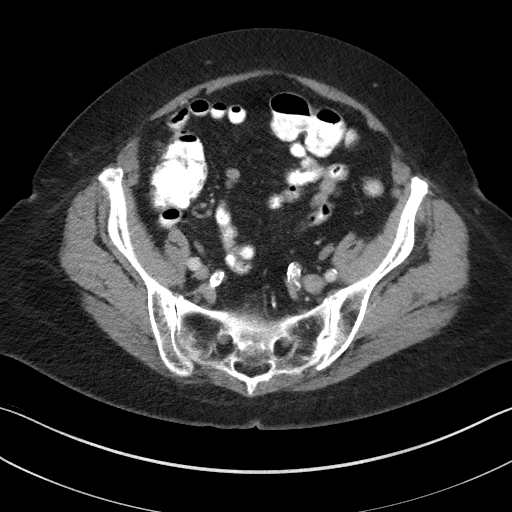
[im 41/93  soft-tissue]
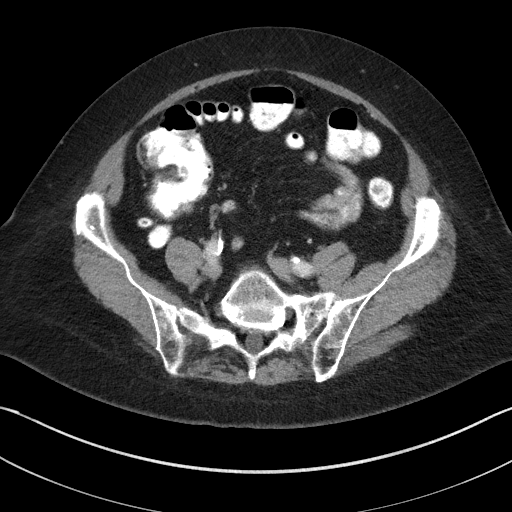
[im 52/93  soft-tissue]
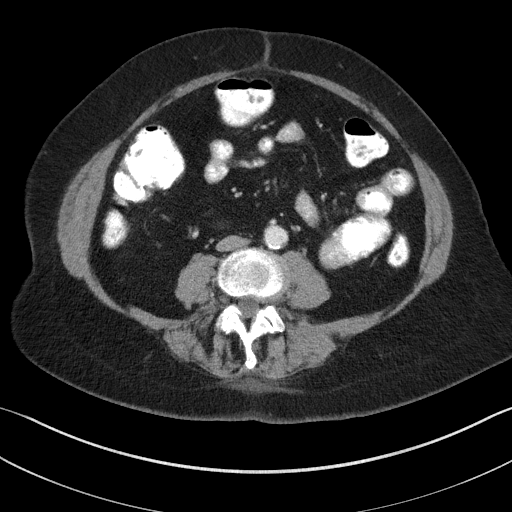
[im 57/93  soft-tissue]
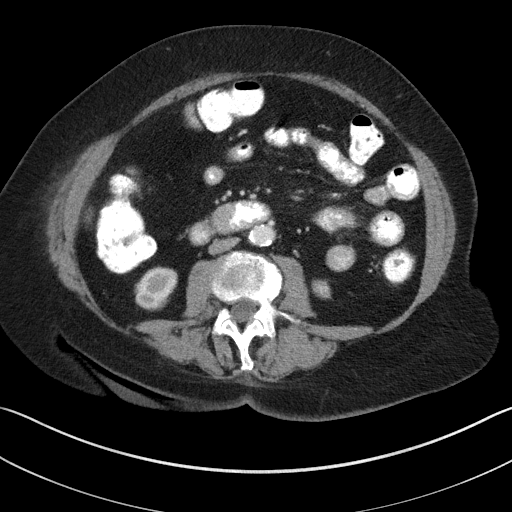
[im 67/93  soft-tissue]
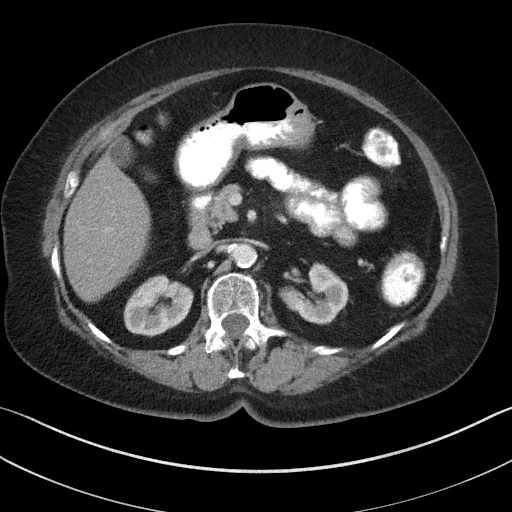
[im 67/93  bone]
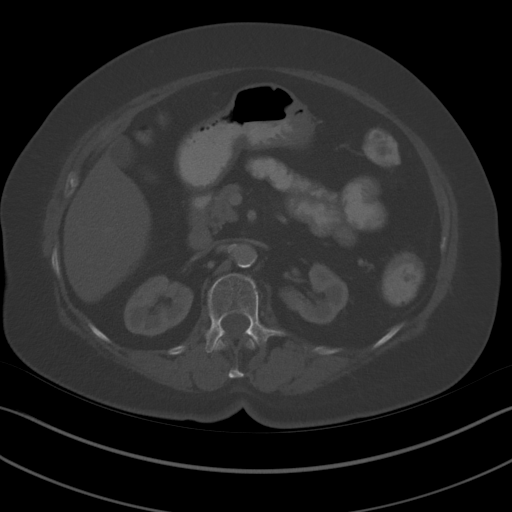
[im 72/93  soft-tissue]
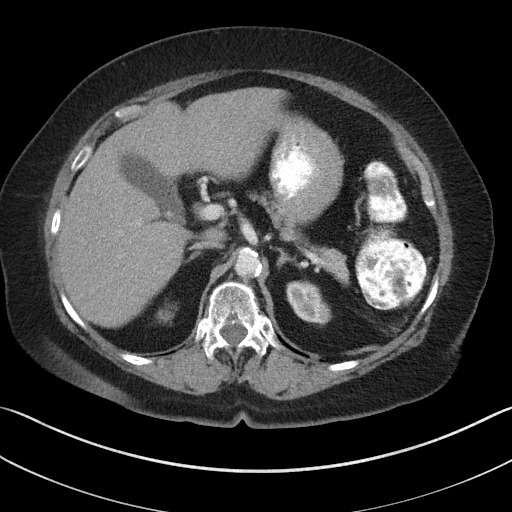
[im 72/93  lung]
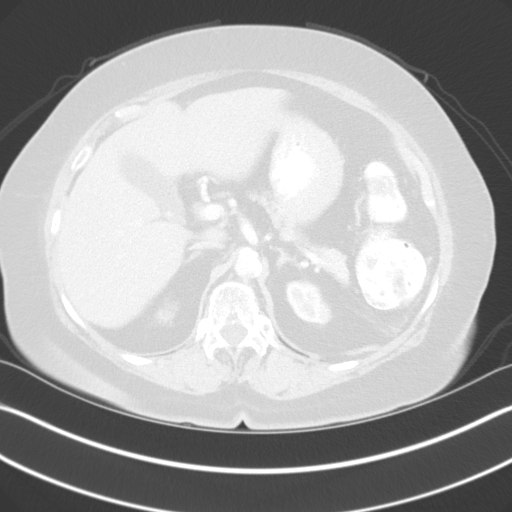
[im 77/93  soft-tissue]
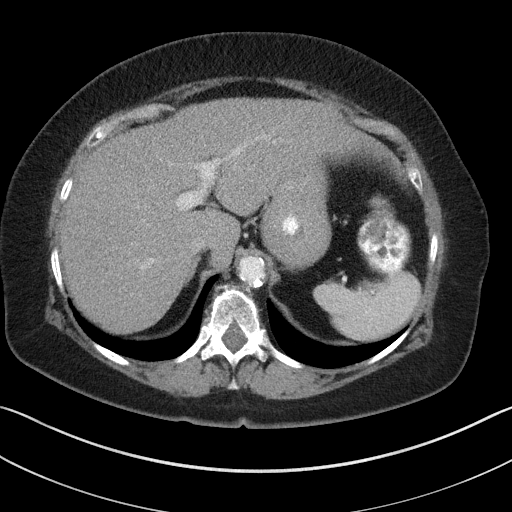
[im 77/93  lung]
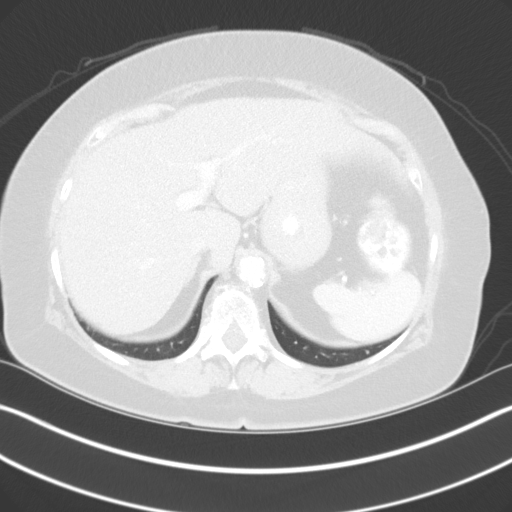
[im 82/93  lung]
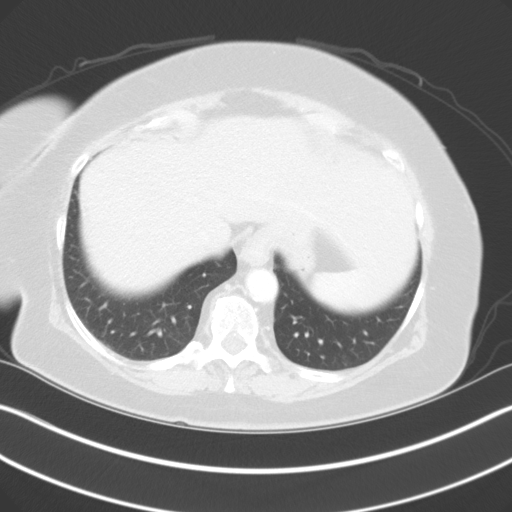
[im 87/93  soft-tissue]
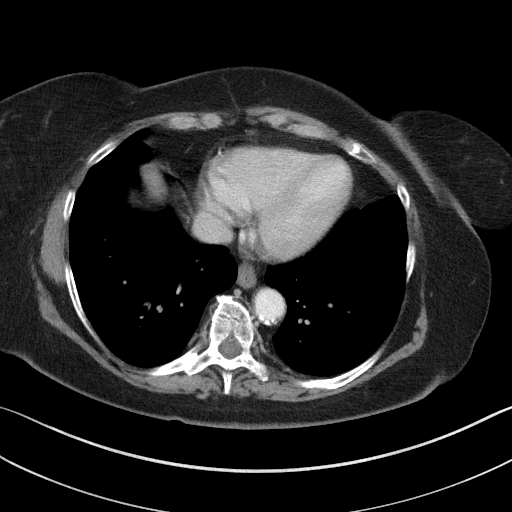
[im 87/93  lung]
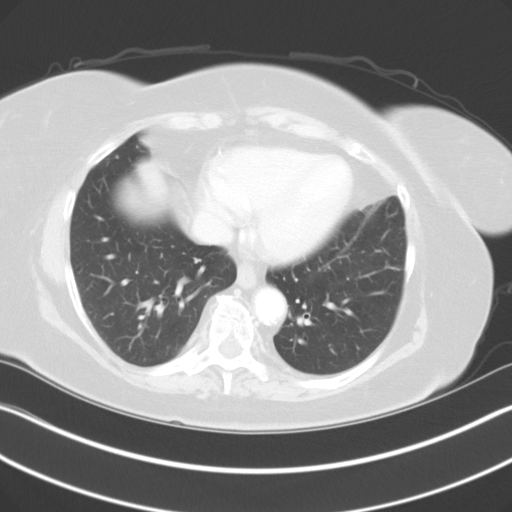

[13 of 32 positions shown; findings below may reference images not displayed]

FINDINGS: Lower chest: No acute abnormality.

Hepatobiliary: Liver is diffusely decreased in attenuation
consistent with fatty infiltration. Cholelithiasis is again
identified without complicating factors.

Pancreas: Unremarkable. No pancreatic ductal dilatation or
surrounding inflammatory changes.

Spleen: Normal in size without focal abnormality.

Adrenals/Urinary Tract: The adrenal glands are within normal limits.
The kidneys show no renal calculi or obstructive changes. Normal
excretion of contrast is noted on delayed images.

Stomach/Bowel: The appendix is within normal limits. In the
descending colon there is an area of circumferential wall thickening
which given the patient's clinical history is suspicious for
underlying neoplasm. This was not present on the prior exam. The
lesion seen on recent physical exam is not appreciated on this
study. No obstructive changes or inflammatory changes are seen.

Vascular/Lymphatic: Aortic atherosclerosis. No enlarged abdominal or
pelvic lymph nodes.

Reproductive: Uterus and bilateral adnexa are unremarkable.

Other: No abdominal wall hernia or abnormality. No abdominopelvic
ascites.

Musculoskeletal: Degenerative changes of lumbar spine are seen. No
acute bony abnormality is noted.
IMPRESSION: Circumferential thickening within the mid descending colon hila
suspicious for underlying neoplasm given the patient's clinical
history. Special attention this area should be made on upcoming
colonoscopy

The patient's known anal lesion is not well appreciated on this
exam.

Cholelithiasis stable from the prior study.

These results will be called to the ordering clinician or
representative by the Radiologist Assistant, and communication
documented in the PACS or zVision Dashboard.

## 2018-01-03 ENCOUNTER — Ambulatory Visit: Payer: Self-pay | Admitting: Family Medicine

## 2018-01-05 ENCOUNTER — Ambulatory Visit: Payer: Self-pay | Admitting: Family Medicine

## 2018-01-10 ENCOUNTER — Ambulatory Visit (INDEPENDENT_AMBULATORY_CARE_PROVIDER_SITE_OTHER): Payer: Medicare Other | Admitting: Family Medicine

## 2018-01-10 VITALS — BP 126/62 | HR 88 | Temp 98.2°F | Resp 16 | Ht 63.0 in | Wt 176.0 lb

## 2018-01-10 DIAGNOSIS — C20 Malignant neoplasm of rectum: Secondary | ICD-10-CM

## 2018-01-10 DIAGNOSIS — Z6830 Body mass index (BMI) 30.0-30.9, adult: Secondary | ICD-10-CM | POA: Diagnosis not present

## 2018-01-10 DIAGNOSIS — I10 Essential (primary) hypertension: Secondary | ICD-10-CM | POA: Diagnosis not present

## 2018-01-10 DIAGNOSIS — E6609 Other obesity due to excess calories: Secondary | ICD-10-CM | POA: Diagnosis not present

## 2018-01-10 NOTE — Progress Notes (Signed)
Patient: Tracy Silva Female    DOB: 1940/04/23   77 y.o.   MRN: [id number] Visit Date: 01/10/2018  Today's Provider: Wilhemena Durie, MD   Chief Complaint  Patient presents with  . Follow-up   Subjective:     HPI  Patient comes in today for a follow up. She was last seen in the office 2 months ago. No medications were changed on last visit. However, patient was advised to start the DASH diet.  Patient reports that she has tried to watch salt intake.  BP Readings from Last 3 Encounters:  01/10/18 126/62  11/03/17 130/60  10/21/17 130/68   Labs on 10/26/17 indicated that kidney functions have declined more, and it was recommended that we refer her to a nephrologist. Patient is here to discuss that today.    Allergies  Allergen Reactions  . Codeine Other (See Comments)    Headaches Heart palpations  . Statins     Muscle pain, joint pain  . Adhesive [Tape] Rash    FOR  LONG PERIODS OF TIME FOR  LONG PERIODS OF TIME     Current Outpatient Medications:  .  amLODipine-benazepril (LOTREL) 5-10 MG capsule, TAKE 1 CAPSULE BY MOUTH DAILY FOR BLOOD PRESSURE, Disp: 90 capsule, Rfl: 3 .  ezetimibe (ZETIA) 10 MG tablet, Take 1 tablet (10 mg total) by mouth every morning., Disp: 90 tablet, Rfl: 3 .  ferrous sulfate 325 (65 FE) MG tablet, Take 325 mg by mouth daily with breakfast., Disp: , Rfl:  .  glipiZIDE (GLUCOTROL) 5 MG tablet, Take 1 tablet (5 mg total) by mouth daily before breakfast., Disp: 90 tablet, Rfl: 3 .  hydrochlorothiazide (MICROZIDE) 12.5 MG capsule, Take 1 capsule (12.5 mg total) by mouth daily., Disp: 90 capsule, Rfl: 3 .  levothyroxine (SYNTHROID, LEVOTHROID) 75 MCG tablet, TAKE 1 TABLET EVERY DAY ON AN EMPTY STOMACH WITH A GLASS OF WATER AT LEAST 30-60 MINUTES BEFORE BREAKFAST FOR THYROID, Disp: 30 tablet, Rfl: 12 .  metoprolol succinate (TOPROL-XL) 50 MG 24 hr tablet, Take 1 tablet (50 mg total) by mouth daily. Take with or immediately following a meal.,  Disp: 90 tablet, Rfl: 3 .  Multiple Vitamins-Calcium (ONE-A-DAY WOMENS PO), Take by mouth daily., Disp: , Rfl:  .  pioglitazone (ACTOS) 45 MG tablet, Take 1 tablet (45 mg total) by mouth daily., Disp: 90 tablet, Rfl: 3 .  sertraline (ZOLOFT) 50 MG tablet, Take 1 tablet (50 mg total) by mouth daily., Disp: 30 tablet, Rfl: 3  Review of Systems  Constitutional: Negative.   HENT: Negative.   Eyes: Negative.   Respiratory: Negative for cough and shortness of breath.   Cardiovascular: Negative for chest pain, palpitations and leg swelling.  Gastrointestinal: Negative.   Endocrine: Negative.   Musculoskeletal: Negative.   Allergic/Immunologic: Negative.   Neurological: Negative for dizziness, light-headedness and headaches.  Hematological: Negative.   Psychiatric/Behavioral: Negative.     Social History   Tobacco Use  . Smoking status: Former Smoker    Packs/day: 0.50    Years: 4.00    Pack years: 2.00    Last attempt to quit: 01/26/1971    Years since quitting: 46.9  . Smokeless tobacco: Never Used  Substance Use Topics  . Alcohol use: Yes    Comment: occasionally with dinner      Objective:   BP 126/62   Pulse 88   Temp 98.2 F (36.8 C)   Resp 16   Ht 5\' 3"  (  1.6 m)   Wt 176 lb (79.8 kg)   SpO2 96%   BMI 31.18 kg/m  Vitals:   01/10/18 1328  BP: 126/62  Pulse: 88  Resp: 16  Temp: 98.2 F (36.8 C)  SpO2: 96%  Weight: 176 lb (79.8 kg)  Height: 5\' 3"  (1.6 m)     Physical Exam Constitutional:      Appearance: Normal appearance.  HENT:     Nose: Nose normal.  Eyes:     Conjunctiva/sclera: Conjunctivae normal.  Cardiovascular:     Rate and Rhythm: Normal rate and regular rhythm.     Heart sounds: Normal heart sounds.  Pulmonary:     Effort: Pulmonary effort is normal.     Breath sounds: Normal breath sounds.  Abdominal:     Palpations: Abdomen is soft.  Skin:    General: Skin is warm and dry.  Neurological:     General: No focal deficit present.      Mental Status: She is alert.  Psychiatric:        Mood and Affect: Mood normal.        Behavior: Behavior normal.        Thought Content: Thought content normal.        Judgment: Judgment normal.         Assessment & Plan    1. Essential (primary) hypertension Controlled  2. Rectal cancer ([place]) In oncology.  Doing well.  3. Class 1 obesity due to excess calories with serious comorbidity and body mass index (BMI) of 30.0 to 30.9 in adult Lifestyle stressed.    I have done the exam and reviewed the above chart and it is accurate to the best of my knowledge. Development worker, community has been used in this note in any air is in the dictation or transcription are unintentional.  Wilhemena Durie, MD  [place]

## 2018-01-25 ENCOUNTER — Ambulatory Visit: Payer: Self-pay | Admitting: General Surgery

## 2018-02-02 DIAGNOSIS — M5416 Radiculopathy, lumbar region: Secondary | ICD-10-CM | POA: Diagnosis not present

## 2018-02-02 DIAGNOSIS — M9903 Segmental and somatic dysfunction of lumbar region: Secondary | ICD-10-CM | POA: Diagnosis not present

## 2018-02-02 DIAGNOSIS — M62830 Muscle spasm of back: Secondary | ICD-10-CM | POA: Diagnosis not present

## 2018-02-02 DIAGNOSIS — M9901 Segmental and somatic dysfunction of cervical region: Secondary | ICD-10-CM | POA: Diagnosis not present

## 2018-02-15 ENCOUNTER — Other Ambulatory Visit: Payer: Self-pay | Admitting: Family Medicine

## 2018-02-15 DIAGNOSIS — I10 Essential (primary) hypertension: Secondary | ICD-10-CM

## 2018-02-17 DIAGNOSIS — M62830 Muscle spasm of back: Secondary | ICD-10-CM | POA: Diagnosis not present

## 2018-02-17 DIAGNOSIS — M5416 Radiculopathy, lumbar region: Secondary | ICD-10-CM | POA: Diagnosis not present

## 2018-02-17 DIAGNOSIS — M9901 Segmental and somatic dysfunction of cervical region: Secondary | ICD-10-CM | POA: Diagnosis not present

## 2018-02-17 DIAGNOSIS — M9903 Segmental and somatic dysfunction of lumbar region: Secondary | ICD-10-CM | POA: Diagnosis not present

## 2018-02-18 ENCOUNTER — Other Ambulatory Visit: Payer: Self-pay | Admitting: Family Medicine

## 2018-02-18 DIAGNOSIS — E1169 Type 2 diabetes mellitus with other specified complication: Secondary | ICD-10-CM

## 2018-02-18 DIAGNOSIS — E669 Obesity, unspecified: Principal | ICD-10-CM

## 2018-02-21 ENCOUNTER — Ambulatory Visit: Payer: Self-pay | Admitting: Family Medicine

## 2018-02-28 ENCOUNTER — Other Ambulatory Visit: Payer: Self-pay | Admitting: Family Medicine

## 2018-02-28 DIAGNOSIS — E119 Type 2 diabetes mellitus without complications: Secondary | ICD-10-CM

## 2018-04-27 ENCOUNTER — Other Ambulatory Visit: Payer: Self-pay | Admitting: Family Medicine

## 2018-04-27 DIAGNOSIS — E7849 Other hyperlipidemia: Secondary | ICD-10-CM

## 2018-05-05 ENCOUNTER — Ambulatory Visit: Payer: Self-pay | Admitting: General Surgery

## 2018-05-26 ENCOUNTER — Ambulatory Visit: Payer: Self-pay | Admitting: Family Medicine

## 2018-06-15 ENCOUNTER — Ambulatory Visit: Payer: Self-pay | Admitting: Family Medicine

## 2018-07-07 ENCOUNTER — Ambulatory Visit: Payer: Medicare Other | Admitting: General Surgery

## 2018-07-25 DIAGNOSIS — M9905 Segmental and somatic dysfunction of pelvic region: Secondary | ICD-10-CM | POA: Diagnosis not present

## 2018-07-25 DIAGNOSIS — M9901 Segmental and somatic dysfunction of cervical region: Secondary | ICD-10-CM | POA: Diagnosis not present

## 2018-07-25 DIAGNOSIS — M9903 Segmental and somatic dysfunction of lumbar region: Secondary | ICD-10-CM | POA: Diagnosis not present

## 2018-07-25 DIAGNOSIS — M955 Acquired deformity of pelvis: Secondary | ICD-10-CM | POA: Diagnosis not present

## 2018-07-27 DIAGNOSIS — M9901 Segmental and somatic dysfunction of cervical region: Secondary | ICD-10-CM | POA: Diagnosis not present

## 2018-07-27 DIAGNOSIS — M955 Acquired deformity of pelvis: Secondary | ICD-10-CM | POA: Diagnosis not present

## 2018-07-27 DIAGNOSIS — M9903 Segmental and somatic dysfunction of lumbar region: Secondary | ICD-10-CM | POA: Diagnosis not present

## 2018-07-27 DIAGNOSIS — M9905 Segmental and somatic dysfunction of pelvic region: Secondary | ICD-10-CM | POA: Diagnosis not present

## 2018-07-28 DIAGNOSIS — M9903 Segmental and somatic dysfunction of lumbar region: Secondary | ICD-10-CM | POA: Diagnosis not present

## 2018-07-28 DIAGNOSIS — M955 Acquired deformity of pelvis: Secondary | ICD-10-CM | POA: Diagnosis not present

## 2018-07-28 DIAGNOSIS — M9901 Segmental and somatic dysfunction of cervical region: Secondary | ICD-10-CM | POA: Diagnosis not present

## 2018-07-28 DIAGNOSIS — M9905 Segmental and somatic dysfunction of pelvic region: Secondary | ICD-10-CM | POA: Diagnosis not present

## 2018-08-01 DIAGNOSIS — M955 Acquired deformity of pelvis: Secondary | ICD-10-CM | POA: Diagnosis not present

## 2018-08-01 DIAGNOSIS — M9903 Segmental and somatic dysfunction of lumbar region: Secondary | ICD-10-CM | POA: Diagnosis not present

## 2018-08-01 DIAGNOSIS — M9905 Segmental and somatic dysfunction of pelvic region: Secondary | ICD-10-CM | POA: Diagnosis not present

## 2018-08-01 DIAGNOSIS — M9901 Segmental and somatic dysfunction of cervical region: Secondary | ICD-10-CM | POA: Diagnosis not present

## 2018-08-15 DIAGNOSIS — M9905 Segmental and somatic dysfunction of pelvic region: Secondary | ICD-10-CM | POA: Diagnosis not present

## 2018-08-15 DIAGNOSIS — M955 Acquired deformity of pelvis: Secondary | ICD-10-CM | POA: Diagnosis not present

## 2018-08-15 DIAGNOSIS — M9901 Segmental and somatic dysfunction of cervical region: Secondary | ICD-10-CM | POA: Diagnosis not present

## 2018-08-15 DIAGNOSIS — M9903 Segmental and somatic dysfunction of lumbar region: Secondary | ICD-10-CM | POA: Diagnosis not present

## 2018-08-18 DIAGNOSIS — M955 Acquired deformity of pelvis: Secondary | ICD-10-CM | POA: Diagnosis not present

## 2018-08-18 DIAGNOSIS — M9903 Segmental and somatic dysfunction of lumbar region: Secondary | ICD-10-CM | POA: Diagnosis not present

## 2018-08-18 DIAGNOSIS — M9901 Segmental and somatic dysfunction of cervical region: Secondary | ICD-10-CM | POA: Diagnosis not present

## 2018-08-18 DIAGNOSIS — M9905 Segmental and somatic dysfunction of pelvic region: Secondary | ICD-10-CM | POA: Diagnosis not present

## 2018-08-25 ENCOUNTER — Ambulatory Visit: Payer: Medicare Other | Admitting: General Surgery

## 2018-08-30 ENCOUNTER — Ambulatory Visit: Payer: Self-pay | Admitting: Family Medicine

## 2018-09-06 DIAGNOSIS — M9901 Segmental and somatic dysfunction of cervical region: Secondary | ICD-10-CM | POA: Diagnosis not present

## 2018-09-06 DIAGNOSIS — M9905 Segmental and somatic dysfunction of pelvic region: Secondary | ICD-10-CM | POA: Diagnosis not present

## 2018-09-06 DIAGNOSIS — M955 Acquired deformity of pelvis: Secondary | ICD-10-CM | POA: Diagnosis not present

## 2018-09-06 DIAGNOSIS — M9903 Segmental and somatic dysfunction of lumbar region: Secondary | ICD-10-CM | POA: Diagnosis not present

## 2018-09-21 DIAGNOSIS — M9903 Segmental and somatic dysfunction of lumbar region: Secondary | ICD-10-CM | POA: Diagnosis not present

## 2018-09-21 DIAGNOSIS — M9905 Segmental and somatic dysfunction of pelvic region: Secondary | ICD-10-CM | POA: Diagnosis not present

## 2018-09-21 DIAGNOSIS — M9901 Segmental and somatic dysfunction of cervical region: Secondary | ICD-10-CM | POA: Diagnosis not present

## 2018-09-21 DIAGNOSIS — M955 Acquired deformity of pelvis: Secondary | ICD-10-CM | POA: Diagnosis not present

## 2018-10-25 DIAGNOSIS — M9901 Segmental and somatic dysfunction of cervical region: Secondary | ICD-10-CM | POA: Diagnosis not present

## 2018-10-25 DIAGNOSIS — M9903 Segmental and somatic dysfunction of lumbar region: Secondary | ICD-10-CM | POA: Diagnosis not present

## 2018-10-25 DIAGNOSIS — M955 Acquired deformity of pelvis: Secondary | ICD-10-CM | POA: Diagnosis not present

## 2018-10-25 DIAGNOSIS — M9905 Segmental and somatic dysfunction of pelvic region: Secondary | ICD-10-CM | POA: Diagnosis not present

## 2018-11-02 ENCOUNTER — Ambulatory Visit: Payer: Self-pay | Admitting: Family Medicine

## 2018-11-07 ENCOUNTER — Encounter: Payer: Self-pay | Admitting: Family Medicine

## 2018-11-07 ENCOUNTER — Other Ambulatory Visit: Payer: Self-pay

## 2018-11-07 ENCOUNTER — Ambulatory Visit (INDEPENDENT_AMBULATORY_CARE_PROVIDER_SITE_OTHER): Payer: Medicare Other | Admitting: Family Medicine

## 2018-11-07 VITALS — BP 128/70 | HR 72 | Temp 97.9°F | Resp 16 | Ht 63.0 in | Wt 177.0 lb

## 2018-11-07 DIAGNOSIS — Z23 Encounter for immunization: Secondary | ICD-10-CM | POA: Diagnosis not present

## 2018-11-07 DIAGNOSIS — C20 Malignant neoplasm of rectum: Secondary | ICD-10-CM

## 2018-11-07 DIAGNOSIS — E039 Hypothyroidism, unspecified: Secondary | ICD-10-CM | POA: Diagnosis not present

## 2018-11-07 DIAGNOSIS — I10 Essential (primary) hypertension: Secondary | ICD-10-CM | POA: Diagnosis not present

## 2018-11-07 DIAGNOSIS — I351 Nonrheumatic aortic (valve) insufficiency: Secondary | ICD-10-CM

## 2018-11-07 DIAGNOSIS — D649 Anemia, unspecified: Secondary | ICD-10-CM

## 2018-11-07 DIAGNOSIS — E119 Type 2 diabetes mellitus without complications: Secondary | ICD-10-CM

## 2018-11-07 DIAGNOSIS — N183 Chronic kidney disease, stage 3 unspecified: Secondary | ICD-10-CM

## 2018-11-07 DIAGNOSIS — E7849 Other hyperlipidemia: Secondary | ICD-10-CM | POA: Diagnosis not present

## 2018-11-07 NOTE — Progress Notes (Signed)
Patient: Tracy Silva Female    DOB: 1940/08/03   78 y.o.   MRN: [id number] Visit Date: 11/07/2018  Today's Provider: Wilhemena Durie, MD   Chief Complaint  Patient presents with  . Hypertension  . Hypothyroidism  . Diabetes  . Abnormal Lab   Subjective:   HPI  Hypertension, follow-up:  BP Readings from Last 3 Encounters:  11/07/18 128/70  01/10/18 126/62  11/03/17 130/60    She was last seen for hypertension 10 months ago.  BP at that visit was 126/62. Management since that visit includes no changes. She reports good compliance with treatment. She is not having side effects.  She is not exercising. She is adherent to low salt diet.   Outside blood pressures are checked occasionally. She is experiencing none.  Patient denies exertional chest pressure/discomfort, lower extremity edema and palpitations.    Weight trend: stable Wt Readings from Last 3 Encounters:  11/07/18 177 lb (80.3 kg)  01/10/18 176 lb (79.8 kg)  11/03/17 175 lb (79.4 kg)    Current diet: well balanced  Hypothyroidism, follow up: Patient was last seen in the office 10 months ago. No medications were changed since last visit. She is currently taking levothyroxine 24mcg once daily, and reports good compliance and good symptom control.  Lab Results  Component Value Date   TSH 0.180 (L) 10/26/2017   Abnormal Kidney functions: Patient was advised on last visit her kidney functs was slightly abnormal. She has not seen a nephrologist yet.  She has a history of myalgias to any statin medication per her .  Allergies  Allergen Reactions  . Codeine Other (See Comments)    Headaches Heart palpations  . Statins     Muscle pain, joint pain  . Adhesive [Tape] Rash    FOR  LONG PERIODS OF TIME FOR  LONG PERIODS OF TIME     Current Outpatient Medications:  .  amLODipine-benazepril (LOTREL) 5-10 MG capsule, TAKE 1 CAPSULE EVERY DAY FOR BLOOD PRESSURE (HIGH), Disp: 90 capsule, Rfl: 3  .  ezetimibe (ZETIA) 10 MG tablet, TAKE ONE TABLET EVERY MORNING, Disp: 90 tablet, Rfl: 3 .  glipiZIDE (GLUCOTROL) 5 MG tablet, TAKE ONE TABLET BY MOUTH EVERY DAY BEFORE BREAKFAST, Disp: 90 tablet, Rfl: 3 .  hydrochlorothiazide (MICROZIDE) 12.5 MG capsule, TAKE 1 CAPSULE EVERY DAY, Disp: 90 capsule, Rfl: 3 .  levothyroxine (SYNTHROID, LEVOTHROID) 75 MCG tablet, TAKE 1 TABLET EVERY DAY ON AN EMPTY STOMACH WITH A GLASS OF WATER AT LEAST 30-60 MINUTES BEFORE BREAKFAST FOR THYROID, Disp: 30 tablet, Rfl: 12 .  metoprolol succinate (TOPROL-XL) 50 MG 24 hr tablet, TAKE ONE TABLET EVERY DAY WITH OR IMMEDIATELY FOLLOWING A MEAL, Disp: 90 tablet, Rfl: 3 .  Multiple Vitamins-Calcium (ONE-A-DAY WOMENS PO), Take by mouth daily., Disp: , Rfl:  .  pioglitazone (ACTOS) 45 MG tablet, TAKE ONE TABLET EVERY DAY, Disp: 90 tablet, Rfl: 3 .  sertraline (ZOLOFT) 50 MG tablet, Take 1 tablet (50 mg total) by mouth daily., Disp: 30 tablet, Rfl: 3 .  ferrous sulfate 325 (65 FE) MG tablet, Take 325 mg by mouth daily with breakfast., Disp: , Rfl:   Review of Systems  Constitutional: Negative.   Eyes: Negative.   Respiratory: Negative for cough and shortness of breath.   Cardiovascular: Negative for chest pain, palpitations and leg swelling.  Gastrointestinal: Negative.   Endocrine: Negative for cold intolerance, heat intolerance, polydipsia, polyphagia and polyuria.  Musculoskeletal: Positive for arthralgias. Negative for  myalgias.  Allergic/Immunologic: Negative.   Neurological: Negative for dizziness, light-headedness and headaches.  Psychiatric/Behavioral: Negative for agitation, self-injury, sleep disturbance and suicidal ideas. The patient is not nervous/anxious.     Social History   Tobacco Use  . Smoking status: Former Smoker    Packs/day: 0.50    Years: 4.00    Pack years: 2.00    Quit date: 01/26/1971    Years since quitting: 47.8  . Smokeless tobacco: Never Used  Substance Use Topics  . Alcohol use:  Yes    Comment: occasionally with dinner      Objective:   BP 128/70   Pulse 72   Temp 97.9 F (36.6 C)   Resp 16   Ht 5\' 3"  (1.6 m)   Wt 177 lb (80.3 kg)   SpO2 98%   BMI 31.35 kg/m  Vitals:   11/07/18 1033  BP: 128/70  Pulse: 72  Resp: 16  Temp: 97.9 F (36.6 C)  SpO2: 98%  Weight: 177 lb (80.3 kg)  Height: 5\' 3"  (1.6 m)  Body mass index is 31.35 kg/m.   Physical Exam Vitals signs reviewed.  Constitutional:      Appearance: She is well-developed.  HENT:     Head: Normocephalic and atraumatic.     Right Ear: External ear normal.     Left Ear: External ear normal.     Nose: Nose normal.  Eyes:     General: No scleral icterus.    Conjunctiva/sclera: Conjunctivae normal.  Neck:     Thyroid: No thyromegaly.  Cardiovascular:     Rate and Rhythm: Normal rate and regular rhythm.     Heart sounds: Murmur present.     Comments: 2/6 systolic murmur. Varicose veins noted. Pulmonary:     Effort: Pulmonary effort is normal.     Breath sounds: Normal breath sounds.  Abdominal:     Palpations: Abdomen is soft.  Skin:    General: Skin is warm and dry.  Neurological:     Mental Status: She is alert and oriented to person, place, and time.  Psychiatric:        Behavior: Behavior normal.        Thought Content: Thought content normal.        Judgment: Judgment normal.      No results found for any visits on 11/07/18.     Assessment & Plan    1. Essential (primary) hypertension Controlled on amlodipine and hydrochlorothiazide and metoprolol  2. Stage 3 chronic kidney disease, unspecified whether stage 3a or 3b CKD  - Comprehensive metabolic panel  3. Other hyperlipidemia Patient has been statin intolerant but with myalgias - Lipid panel  4. Acquired hypothyroidism  - TSH  5. Anemia, unspecified type  - CBC with Differential/Platelet  6. Need for influenza vaccination  - Flu Vaccine QUAD High Dose(Fluad)  7. Rectal cancer ([place]) Surgically  treated 2 years ago  8. Aortic valve insufficiency, etiology of cardiac valve disease unspecified   9. Type 2 diabetes mellitus without complication, without long-term current use of insulin ([place])      Wilhemena Durie, MD  [facility]

## 2018-11-08 DIAGNOSIS — N183 Chronic kidney disease, stage 3 unspecified: Secondary | ICD-10-CM | POA: Diagnosis not present

## 2018-11-08 DIAGNOSIS — E7849 Other hyperlipidemia: Secondary | ICD-10-CM | POA: Diagnosis not present

## 2018-11-08 DIAGNOSIS — E039 Hypothyroidism, unspecified: Secondary | ICD-10-CM | POA: Diagnosis not present

## 2018-11-08 DIAGNOSIS — D649 Anemia, unspecified: Secondary | ICD-10-CM | POA: Diagnosis not present

## 2018-11-09 LAB — COMPREHENSIVE METABOLIC PANEL
ALT: 10 IU/L (ref 0–32)
AST: 17 IU/L (ref 0–40)
Albumin/Globulin Ratio: 1.8 (ref 1.2–2.2)
Albumin: 4.2 g/dL (ref 3.7–4.7)
Alkaline Phosphatase: 70 IU/L (ref 39–117)
BUN/Creatinine Ratio: 17 (ref 12–28)
BUN: 40 mg/dL — ABNORMAL HIGH (ref 8–27)
Bilirubin Total: <0.2 mg/dL (ref 0.0–1.2)
CO2: 20 mmol/L (ref 20–29)
Calcium: 9.7 mg/dL (ref 8.7–10.3)
Chloride: 107 mmol/L — ABNORMAL HIGH (ref 96–106)
Creatinine, Ser: 2.29 mg/dL — ABNORMAL HIGH (ref 0.57–1.00)
GFR calc Af Amer: 23 mL/min/{1.73_m2} — ABNORMAL LOW (ref >59)
GFR calc non Af Amer: 20 mL/min/{1.73_m2} — ABNORMAL LOW (ref >59)
Globulin, Total: 2.4 g/dL (ref 1.5–4.5)
Glucose: 114 mg/dL — ABNORMAL HIGH (ref 65–99)
Potassium: 4.4 mmol/L (ref 3.5–5.2)
Sodium: 141 mmol/L (ref 134–144)
Total Protein: 6.6 g/dL (ref 6.0–8.5)

## 2018-11-09 LAB — CBC WITH DIFFERENTIAL/PLATELET
Basophils Absolute: 0 10*3/uL (ref 0.0–0.2)
Basos: 1 %
EOS (ABSOLUTE): 0.2 10*3/uL (ref 0.0–0.4)
Eos: 5 %
Hematocrit: 29.6 % — ABNORMAL LOW (ref 34.0–46.6)
Hemoglobin: 9.7 g/dL — ABNORMAL LOW (ref 11.1–15.9)
Immature Grans (Abs): 0 10*3/uL (ref 0.0–0.1)
Immature Granulocytes: 0 %
Lymphocytes Absolute: 1.5 10*3/uL (ref 0.7–3.1)
Lymphs: 32 %
MCH: 32 pg (ref 26.6–33.0)
MCHC: 32.8 g/dL (ref 31.5–35.7)
MCV: 98 fL — ABNORMAL HIGH (ref 79–97)
Monocytes Absolute: 0.4 10*3/uL (ref 0.1–0.9)
Monocytes: 10 %
Neutrophils Absolute: 2.4 10*3/uL (ref 1.4–7.0)
Neutrophils: 52 %
Platelets: 220 10*3/uL (ref 150–450)
RBC: 3.03 x10E6/uL — ABNORMAL LOW (ref 3.77–5.28)
RDW: 13.6 % (ref 11.7–15.4)
WBC: 4.5 10*3/uL (ref 3.4–10.8)

## 2018-11-09 LAB — LIPID PANEL
Chol/HDL Ratio: 4.3 ratio (ref 0.0–4.4)
Cholesterol, Total: 235 mg/dL — ABNORMAL HIGH (ref 100–199)
HDL: 55 mg/dL (ref >39)
LDL Chol Calc (NIH): 138 mg/dL — ABNORMAL HIGH (ref 0–99)
Triglycerides: 234 mg/dL — ABNORMAL HIGH (ref 0–149)
VLDL Cholesterol Cal: 42 mg/dL — ABNORMAL HIGH (ref 5–40)

## 2018-11-09 LAB — TSH: TSH: 14.4 u[IU]/mL — ABNORMAL HIGH (ref 0.450–4.500)

## 2018-11-10 ENCOUNTER — Telehealth: Payer: Self-pay

## 2018-11-10 NOTE — Telephone Encounter (Signed)
Attempted call, no answer or voicemail.

## 2018-11-10 NOTE — Telephone Encounter (Signed)
-----   Message from Jerrol Banana., MD sent at 11/10/2018  1:17 PM EDT ----- Is patient taking Synthroid.  If so increase dose a little.  If not restart it.  Borderline anemia which is chronic.  Kidney function some worse, avoid anti-inflammatories and push liquids.  Refer back to nephrology.

## 2018-11-23 ENCOUNTER — Ambulatory Visit: Payer: Medicare Other

## 2018-11-25 ENCOUNTER — Other Ambulatory Visit: Payer: Self-pay | Admitting: Family Medicine

## 2018-12-20 DIAGNOSIS — M9905 Segmental and somatic dysfunction of pelvic region: Secondary | ICD-10-CM | POA: Diagnosis not present

## 2018-12-20 DIAGNOSIS — M9901 Segmental and somatic dysfunction of cervical region: Secondary | ICD-10-CM | POA: Diagnosis not present

## 2018-12-20 DIAGNOSIS — M9903 Segmental and somatic dysfunction of lumbar region: Secondary | ICD-10-CM | POA: Diagnosis not present

## 2018-12-20 DIAGNOSIS — M955 Acquired deformity of pelvis: Secondary | ICD-10-CM | POA: Diagnosis not present

## 2018-12-24 ENCOUNTER — Other Ambulatory Visit: Payer: Self-pay | Admitting: Family Medicine

## 2018-12-24 DIAGNOSIS — I10 Essential (primary) hypertension: Secondary | ICD-10-CM

## 2019-01-02 ENCOUNTER — Other Ambulatory Visit: Payer: Self-pay | Admitting: Family Medicine

## 2019-01-02 DIAGNOSIS — E119 Type 2 diabetes mellitus without complications: Secondary | ICD-10-CM

## 2019-01-12 ENCOUNTER — Telehealth: Payer: Self-pay | Admitting: Family Medicine

## 2019-01-12 NOTE — Telephone Encounter (Signed)
Patient called to schedule appt for "fatigue and flu like symptoms" Patient was offered a virtual appt or a telephone call- Patient denied appt. Patient stated that she is 78 years old. And she does not know what can be done with out seeing her. Please advise [phone]

## 2019-01-16 NOTE — Telephone Encounter (Signed)
Flu like symptoms cannot be seen in our office due to [place] not having proper PPE. She can either have a virtual appt about for it, or if she needs in office she can go to an urgent care to be seen in person where they have N95 mask and can see her in person

## 2019-01-16 NOTE — Telephone Encounter (Signed)
Please Review

## 2019-01-16 NOTE — Telephone Encounter (Signed)
NA if patient calls [facility] can relay message to patient

## 2019-02-06 DIAGNOSIS — M62830 Muscle spasm of back: Secondary | ICD-10-CM | POA: Diagnosis not present

## 2019-02-06 DIAGNOSIS — M5134 Other intervertebral disc degeneration, thoracic region: Secondary | ICD-10-CM | POA: Diagnosis not present

## 2019-02-06 DIAGNOSIS — M9902 Segmental and somatic dysfunction of thoracic region: Secondary | ICD-10-CM | POA: Diagnosis not present

## 2019-02-06 DIAGNOSIS — M9901 Segmental and somatic dysfunction of cervical region: Secondary | ICD-10-CM | POA: Diagnosis not present

## 2019-02-13 DIAGNOSIS — M62830 Muscle spasm of back: Secondary | ICD-10-CM | POA: Diagnosis not present

## 2019-02-13 DIAGNOSIS — M9902 Segmental and somatic dysfunction of thoracic region: Secondary | ICD-10-CM | POA: Diagnosis not present

## 2019-02-13 DIAGNOSIS — M5134 Other intervertebral disc degeneration, thoracic region: Secondary | ICD-10-CM | POA: Diagnosis not present

## 2019-02-13 DIAGNOSIS — M9901 Segmental and somatic dysfunction of cervical region: Secondary | ICD-10-CM | POA: Diagnosis not present

## 2019-02-15 DIAGNOSIS — M62830 Muscle spasm of back: Secondary | ICD-10-CM | POA: Diagnosis not present

## 2019-02-15 DIAGNOSIS — M9902 Segmental and somatic dysfunction of thoracic region: Secondary | ICD-10-CM | POA: Diagnosis not present

## 2019-02-15 DIAGNOSIS — M5134 Other intervertebral disc degeneration, thoracic region: Secondary | ICD-10-CM | POA: Diagnosis not present

## 2019-02-15 DIAGNOSIS — M9901 Segmental and somatic dysfunction of cervical region: Secondary | ICD-10-CM | POA: Diagnosis not present

## 2019-02-20 ENCOUNTER — Other Ambulatory Visit: Payer: Self-pay | Admitting: Family Medicine

## 2019-02-20 DIAGNOSIS — M9901 Segmental and somatic dysfunction of cervical region: Secondary | ICD-10-CM | POA: Diagnosis not present

## 2019-02-20 DIAGNOSIS — M9902 Segmental and somatic dysfunction of thoracic region: Secondary | ICD-10-CM | POA: Diagnosis not present

## 2019-02-20 DIAGNOSIS — M62830 Muscle spasm of back: Secondary | ICD-10-CM | POA: Diagnosis not present

## 2019-02-20 DIAGNOSIS — M5134 Other intervertebral disc degeneration, thoracic region: Secondary | ICD-10-CM | POA: Diagnosis not present

## 2019-02-27 DIAGNOSIS — M9901 Segmental and somatic dysfunction of cervical region: Secondary | ICD-10-CM | POA: Diagnosis not present

## 2019-02-27 DIAGNOSIS — M62830 Muscle spasm of back: Secondary | ICD-10-CM | POA: Diagnosis not present

## 2019-02-27 DIAGNOSIS — M5134 Other intervertebral disc degeneration, thoracic region: Secondary | ICD-10-CM | POA: Diagnosis not present

## 2019-02-27 DIAGNOSIS — M9902 Segmental and somatic dysfunction of thoracic region: Secondary | ICD-10-CM | POA: Diagnosis not present

## 2019-03-06 DIAGNOSIS — M62830 Muscle spasm of back: Secondary | ICD-10-CM | POA: Diagnosis not present

## 2019-03-06 DIAGNOSIS — M9901 Segmental and somatic dysfunction of cervical region: Secondary | ICD-10-CM | POA: Diagnosis not present

## 2019-03-06 DIAGNOSIS — M5134 Other intervertebral disc degeneration, thoracic region: Secondary | ICD-10-CM | POA: Diagnosis not present

## 2019-03-06 DIAGNOSIS — M9902 Segmental and somatic dysfunction of thoracic region: Secondary | ICD-10-CM | POA: Diagnosis not present

## 2019-03-15 DIAGNOSIS — M9901 Segmental and somatic dysfunction of cervical region: Secondary | ICD-10-CM | POA: Diagnosis not present

## 2019-03-15 DIAGNOSIS — M5134 Other intervertebral disc degeneration, thoracic region: Secondary | ICD-10-CM | POA: Diagnosis not present

## 2019-03-15 DIAGNOSIS — M9902 Segmental and somatic dysfunction of thoracic region: Secondary | ICD-10-CM | POA: Diagnosis not present

## 2019-03-15 DIAGNOSIS — M62830 Muscle spasm of back: Secondary | ICD-10-CM | POA: Diagnosis not present

## 2019-03-22 DIAGNOSIS — M62830 Muscle spasm of back: Secondary | ICD-10-CM | POA: Diagnosis not present

## 2019-03-22 DIAGNOSIS — M5134 Other intervertebral disc degeneration, thoracic region: Secondary | ICD-10-CM | POA: Diagnosis not present

## 2019-03-22 DIAGNOSIS — M9902 Segmental and somatic dysfunction of thoracic region: Secondary | ICD-10-CM | POA: Diagnosis not present

## 2019-03-22 DIAGNOSIS — M9901 Segmental and somatic dysfunction of cervical region: Secondary | ICD-10-CM | POA: Diagnosis not present

## 2019-03-29 DIAGNOSIS — M62830 Muscle spasm of back: Secondary | ICD-10-CM | POA: Diagnosis not present

## 2019-03-29 DIAGNOSIS — M9901 Segmental and somatic dysfunction of cervical region: Secondary | ICD-10-CM | POA: Diagnosis not present

## 2019-03-29 DIAGNOSIS — M5134 Other intervertebral disc degeneration, thoracic region: Secondary | ICD-10-CM | POA: Diagnosis not present

## 2019-03-29 DIAGNOSIS — M9902 Segmental and somatic dysfunction of thoracic region: Secondary | ICD-10-CM | POA: Diagnosis not present

## 2019-04-04 ENCOUNTER — Other Ambulatory Visit: Payer: Self-pay | Admitting: Family Medicine

## 2019-04-04 DIAGNOSIS — E669 Obesity, unspecified: Secondary | ICD-10-CM

## 2019-04-04 DIAGNOSIS — E1169 Type 2 diabetes mellitus with other specified complication: Secondary | ICD-10-CM

## 2019-04-04 NOTE — Telephone Encounter (Signed)
Requested medication (s) are due for refill today: yes  Requested medication (s) are on the active medication list: yes  Last refill: 02/18/18  Future visit scheduled: yes  Notes to clinic:  Pharmacy request for new RX. Last A1C 10/26/17.    Requested Prescriptions  Pending Prescriptions Disp Refills   glipiZIDE (GLUCOTROL) 5 MG tablet [Pharmacy Med Name: GLIPIZIDE 5 MG TAB] 90 tablet 3    Sig: TAKE ONE TABLET BY MOUTH EVERY DAY BEFORE BREAKFAST      Endocrinology:  Diabetes - Sulfonylureas Failed - 04/04/2019 12:06 PM      Failed - HBA1C is between 0 and 7.9 and within 180 days    Hgb A1c MFr Bld  Date Value Ref Range Status  10/26/2017 6.5 (H) 4.8 - 5.6 % Final    Comment:             Prediabetes: 5.7 - 6.4          Diabetes: >6.4          Glycemic control for adults with diabetes: <7.0           Passed - Valid encounter within last 6 months    Recent Outpatient Visits           4 months ago Essential (primary) hypertension   [place] Jerrol Banana., MD   1 year ago Essential (primary) hypertension   [facility] Jerrol Banana., MD   1 year ago CKD (chronic kidney disease) stage 3, GFR 30-59 ml/min [facility])   [facility] Jerrol Banana., MD   1 year ago Diabetes mellitus type 2 in obese [facility])   [facility] Jerrol Banana., MD   1 year ago [place] Jerrol Banana., MD       Future Appointments             In 5 months Jerrol Banana., MD [facility], [place]

## 2019-04-17 DIAGNOSIS — M62830 Muscle spasm of back: Secondary | ICD-10-CM | POA: Diagnosis not present

## 2019-04-17 DIAGNOSIS — M9901 Segmental and somatic dysfunction of cervical region: Secondary | ICD-10-CM | POA: Diagnosis not present

## 2019-04-17 DIAGNOSIS — M9902 Segmental and somatic dysfunction of thoracic region: Secondary | ICD-10-CM | POA: Diagnosis not present

## 2019-04-17 DIAGNOSIS — M5134 Other intervertebral disc degeneration, thoracic region: Secondary | ICD-10-CM | POA: Diagnosis not present

## 2019-04-24 ENCOUNTER — Other Ambulatory Visit: Payer: Self-pay | Admitting: Family Medicine

## 2019-04-24 DIAGNOSIS — E7849 Other hyperlipidemia: Secondary | ICD-10-CM

## 2019-04-24 NOTE — Telephone Encounter (Signed)
Requested Prescriptions  Pending Prescriptions Disp Refills  . ezetimibe (ZETIA) 10 MG tablet [Pharmacy Med Name: EZETIMIBE 10 MG TAB] 90 tablet 3    Sig: TAKE ONE TABLET BY MOUTH EVERY MORNING     Cardiovascular:  Antilipid - Sterol Transport Inhibitors Failed - 04/24/2019 11:09 AM      Failed - Total Cholesterol in normal range and within 360 days    Cholesterol, Total  Date Value Ref Range Status  11/08/2018 235 (H) 100 - 199 mg/dL Final         Failed - LDL in normal range and within 360 days    LDL Chol Calc (NIH)  Date Value Ref Range Status  11/08/2018 138 (H) 0 - 99 mg/dL Final         Failed - Triglycerides in normal range and within 360 days    Triglycerides  Date Value Ref Range Status  11/08/2018 234 (H) 0 - 149 mg/dL Final         Passed - HDL in normal range and within 360 days    HDL  Date Value Ref Range Status  11/08/2018 55 >39 mg/dL Final         Passed - Valid encounter within last 12 months    Recent Outpatient Visits          5 months ago Essential (primary) hypertension   [place] Jerrol Banana., MD   1 year ago Essential (primary) hypertension   [facility] [address] Jerrol Banana., MD   1 year ago CKD (chronic kidney disease) stage 3, GFR 30-59 ml/min [facility])   [facility] Jerrol Banana., MD   1 year ago Diabetes mellitus type 2 in obese [facility])   [facility] Jerrol Banana., MD   1 year ago [place] Jerrol Banana., MD      Future Appointments            In 4 months Jerrol Banana., MD [facility], [facility]           . amLODipine-benazepril (LOTREL) 5-10 MG capsule [Pharmacy Med Name: AMLODIPINE-BENAZEPRIL 5-10 MG CAP] 90 capsule 3    Sig: TAKE 1 CAPSULE BY MOUTH EVERY DAY FOR HIGH BLOOD PRESSURE     Cardiovascular: CCB + ACEI Combos Failed - 04/24/2019 11:09 AM      Failed - Cr in normal range and within  180 days    Creat  Date Value Ref Range Status  11/30/2016 1.62 (H) 0.60 - 0.93 mg/dL Final    Comment:    For patients >41 years of age, the reference limit for Creatinine is approximately 13% higher for people identified as African-American. .    Creatinine, Ser  Date Value Ref Range Status  11/08/2018 2.29 (H) 0.57 - 1.00 mg/dL Final         Passed - K in normal range and within 180 days    Potassium  Date Value Ref Range Status  11/08/2018 4.4 3.5 - 5.2 mmol/L Final  07/01/2012 4.1 3.5 - 5.1 mmol/L Final         Passed - Patient is not pregnant      Passed - Last BP in normal range    BP Readings from Last 1 Encounters:  11/07/18 128/70         Passed - Valid encounter within last 6 months    Recent Outpatient Visits  5 months ago Essential (primary) hypertension   [facility] Jerrol Banana., MD   1 year ago Essential (primary) hypertension   [facility] Jerrol Banana., MD   1 year ago CKD (chronic kidney disease) stage 3, GFR 30-59 ml/min [facility])   [facility] Jerrol Banana., MD   1 year ago Diabetes mellitus type 2 in obese [facility])   [facility] Jerrol Banana., MD   1 year ago [place] Jerrol Banana., MD      Future Appointments            In 4 months Jerrol Banana., MD [facility], [facility]

## 2019-05-03 DIAGNOSIS — M9902 Segmental and somatic dysfunction of thoracic region: Secondary | ICD-10-CM | POA: Diagnosis not present

## 2019-05-03 DIAGNOSIS — M62830 Muscle spasm of back: Secondary | ICD-10-CM | POA: Diagnosis not present

## 2019-05-03 DIAGNOSIS — M9901 Segmental and somatic dysfunction of cervical region: Secondary | ICD-10-CM | POA: Diagnosis not present

## 2019-05-03 DIAGNOSIS — M5134 Other intervertebral disc degeneration, thoracic region: Secondary | ICD-10-CM | POA: Diagnosis not present

## 2019-06-06 DIAGNOSIS — M62830 Muscle spasm of back: Secondary | ICD-10-CM | POA: Diagnosis not present

## 2019-06-06 DIAGNOSIS — M9902 Segmental and somatic dysfunction of thoracic region: Secondary | ICD-10-CM | POA: Diagnosis not present

## 2019-06-06 DIAGNOSIS — M5134 Other intervertebral disc degeneration, thoracic region: Secondary | ICD-10-CM | POA: Diagnosis not present

## 2019-06-06 DIAGNOSIS — M9901 Segmental and somatic dysfunction of cervical region: Secondary | ICD-10-CM | POA: Diagnosis not present

## 2019-06-07 ENCOUNTER — Telehealth: Payer: Self-pay | Admitting: Family Medicine

## 2019-06-07 NOTE — Telephone Encounter (Signed)
Spoke with patient she stated she did not want to do her AWVI

## 2019-06-12 ENCOUNTER — Telehealth: Payer: Self-pay | Admitting: Family Medicine

## 2019-06-12 NOTE — Telephone Encounter (Signed)
Spoke with patient she stated she was doing fine and did not need the AWV.  She stated she will discuss this w/pcp

## 2019-07-05 ENCOUNTER — Other Ambulatory Visit: Payer: Self-pay | Admitting: Family Medicine

## 2019-07-05 DIAGNOSIS — E1169 Type 2 diabetes mellitus with other specified complication: Secondary | ICD-10-CM

## 2019-07-05 NOTE — Telephone Encounter (Signed)
Requested Prescriptions  Pending Prescriptions Disp Refills  . glipiZIDE (GLUCOTROL) 5 MG tablet [Pharmacy Med Name: GLIPIZIDE 5 MG TAB] 90 tablet 1    Sig: TAKE ONE TABLET BY MOUTH EVERY DAY BEFORE BREAKFAST     Endocrinology:  Diabetes - Sulfonylureas Failed - 07/05/2019  8:30 AM      Failed - HBA1C is between 0 and 7.9 and within 180 days    Hgb A1c MFr Bld  Date Value Ref Range Status  10/26/2017 6.5 (H) 4.8 - 5.6 % Final    Comment:             Prediabetes: 5.7 - 6.4          Diabetes: >6.4          Glycemic control for adults with diabetes: <7.0          Failed - Valid encounter within last 6 months    Recent Outpatient Visits          8 months ago Essential (primary) hypertension   [facility] Jerrol Banana., MD   1 year ago Essential (primary) hypertension   [facility] Jerrol Banana., MD   1 year ago CKD (chronic kidney disease) stage 3, GFR 30-59 ml/min [facility])   [facility] Jerrol Banana., MD   1 year ago Diabetes mellitus type 2 in obese [facility])   [facility] Jerrol Banana., MD   1 year ago [place] Jerrol Banana., MD      Future Appointments            In 2 months Jerrol Banana., MD [facility], [place]

## 2019-07-13 ENCOUNTER — Other Ambulatory Visit: Payer: Self-pay | Admitting: Family Medicine

## 2019-07-13 DIAGNOSIS — M62830 Muscle spasm of back: Secondary | ICD-10-CM | POA: Diagnosis not present

## 2019-07-13 DIAGNOSIS — M9901 Segmental and somatic dysfunction of cervical region: Secondary | ICD-10-CM | POA: Diagnosis not present

## 2019-07-13 DIAGNOSIS — M5134 Other intervertebral disc degeneration, thoracic region: Secondary | ICD-10-CM | POA: Diagnosis not present

## 2019-07-13 DIAGNOSIS — M9902 Segmental and somatic dysfunction of thoracic region: Secondary | ICD-10-CM | POA: Diagnosis not present

## 2019-07-13 MED ORDER — LORATADINE 10 MG PO TABS
10.0000 mg | ORAL_TABLET | Freq: Every day | ORAL | 11 refills | Status: DC
Start: 2019-07-13 — End: 2021-09-23

## 2019-07-13 NOTE — Telephone Encounter (Signed)
Yes, loratadine 10 mg daily.  #30, 11 refills

## 2019-07-13 NOTE — Telephone Encounter (Signed)
RX sent to pharmacy  

## 2019-07-13 NOTE — Telephone Encounter (Signed)
Medication Refill - Medication: Sinus congestion medication ( Patient stated she couldn't remember the name of the medication)  Has the patient contacted their pharmacy? yes (Agent: If no, request that the patient contact the pharmacy for the refill.) (Agent: If yes, when and what did the pharmacy advise?)contact PCP Preferred Pharmacy (with phone number or street name):  [place], [place] - [place] Phone:  [phone]  Fax:  [phone]       Agent: Please be advised that RX refills may take up to 3 business days. We ask that you follow-up with your pharmacy.

## 2019-07-13 NOTE — Telephone Encounter (Signed)
Claritin is the only allergy medication I can see in her history- would this be something PCP would want to send in for patient?

## 2019-07-17 ENCOUNTER — Telehealth: Payer: Self-pay | Admitting: Family Medicine

## 2019-07-17 NOTE — Telephone Encounter (Signed)
Pt calling back.  Message from Dr. Rosanna Randy read to pt, verbalizes understanding. States she was not seen as with symptoms present, she could not come for OV. States cough productive, clear phlegm, afebrile, no SOB or other symptoms. No further sinus issues "Just cough." States she will try Robitussin first as advised. Advised to CB if symptoms worsen, persist.

## 2019-07-17 NOTE — Telephone Encounter (Signed)
Patient states she was seen recently for sinus issues but does not feel like the medication prescribed is helping with her congestion. Patient inquired if Dr. Rosanna Randy would be willing to send in something else to help with congestion in chest. Please advise.

## 2019-07-17 NOTE — Telephone Encounter (Signed)
Please advise 

## 2019-07-17 NOTE — Telephone Encounter (Signed)
Pt called back asking if Dr. Rosanna Randy has called

## 2019-07-17 NOTE — Telephone Encounter (Signed)
I do not see where she has been seen anywhere for sinus issues.  I am happy for her to just get some Robitussin and take twice a day.  Otherwise she may need a visit with someone.

## 2019-08-04 NOTE — Progress Notes (Signed)
I,Laura E Walsh,acting as a scribe for Wilhemena Durie, MD.,have documented all relevant documentation on the behalf of Wilhemena Durie, MD,as directed by  Wilhemena Durie, MD while in the presence of Wilhemena Durie, MD.   Established patient visit   Patient: Tracy Silva   DOB: 01/10/1941   79 y.o. Female  MRN: [id number] Visit Date: 08/08/2019  Today's healthcare provider: Wilhemena Durie, MD   Chief Complaint  Patient presents with  . Diabetes  . Hyperlipidemia  . Hypertension  . Hypothyroidism   Subjective    HPI  Pt feels well.  She has not had lab work in a year and a half says she feels well.  Patient has not kept follow-up for colon cancer with surgery or oncology.  She is having no GI problems. Diabetes Mellitus Type II, Follow-up  Lab Results  Component Value Date   HGBA1C 5.9 (A) 08/08/2019   HGBA1C 6.5 (H) 10/26/2017   HGBA1C 6.3 (H) 03/25/2017   Wt Readings from Last 3 Encounters:  08/08/19 175 lb (79.4 kg)  11/07/18 177 lb (80.3 kg)  01/10/18 176 lb (79.8 kg)   Last seen for diabetes 6 months ago.  Management since then includes no changes. She reports excellent compliance with treatment. She is not having side effects.  Symptoms: No fatigue No foot ulcerations  No appetite changes No nausea  No paresthesia of the feet  No polydipsia  No polyuria No visual disturbances   No vomiting     Home blood sugar records: are not being checked  Episodes of hypoglycemia? No    Current insulin regiment: none Most Recent Eye Exam: UTD Current diet habits: in general, a "healthy" diet    Pertinent Labs: Lab Results  Component Value Date   CHOL 235 (H) 11/08/2018   HDL 55 11/08/2018   LDLCALC 138 (H) 11/08/2018   TRIG 234 (H) 11/08/2018   CHOLHDL 4.3 11/08/2018   Lab Results  Component Value Date   NA 141 11/08/2018   K 4.4 11/08/2018   CREATININE 2.29 (H) 11/08/2018   GFRNONAA 20 (L) 11/08/2018   GFRAA 23 (L) 11/08/2018    GLUCOSE 114 (H) 11/08/2018     --------------------------------------------------------------------------------------------------- Hypertension, follow-up  BP Readings from Last 3 Encounters:  08/08/19 116/72  11/07/18 128/70  01/10/18 126/62   Wt Readings from Last 3 Encounters:  08/08/19 175 lb (79.4 kg)  11/07/18 177 lb (80.3 kg)  01/10/18 176 lb (79.8 kg)     She was last seen for hypertension 6 months ago.  BP at that visit was 128/70. Management since that visit includes no changes.  She reports excellent compliance with treatment. She is not having side effects.  She is following a Regular diet. She is exercising. She does not smoke.  Use of agents associated with hypertension: none.   Outside blood pressures are not being checked. Symptoms: No chest pain No chest pressure  No palpitations No syncope  No dyspnea No orthopnea  No paroxysmal nocturnal dyspnea No lower extremity edema   Pertinent labs: Lab Results  Component Value Date   CHOL 235 (H) 11/08/2018   HDL 55 11/08/2018   LDLCALC 138 (H) 11/08/2018   TRIG 234 (H) 11/08/2018   CHOLHDL 4.3 11/08/2018   Lab Results  Component Value Date   NA 141 11/08/2018   K 4.4 11/08/2018   CREATININE 2.29 (H) 11/08/2018   GFRNONAA 20 (L) 11/08/2018   GFRAA 23 (L) 11/08/2018  GLUCOSE 114 (H) 11/08/2018     The ASCVD Risk score Mikey Bussing DC Jr., et al., 2013) failed to calculate for the following reasons:   The 2013 ASCVD risk score is only valid for ages 5 to 41   --------------------------------------------------------------------------------------------------- Hypothyroid, follow-up  Lab Results  Component Value Date   TSH 14.400 (H) 11/08/2018   TSH 0.180 (L) 10/26/2017   TSH 9.050 (H) 05/27/2017   Wt Readings from Last 3 Encounters:  08/08/19 175 lb (79.4 kg)  11/07/18 177 lb (80.3 kg)  01/10/18 176 lb (79.8 kg)    She was last seen for hypothyroid 6 months ago.  Management since that visit  includes checking lab. She reports excellent compliance with treatment. She is not having side effects.   Symptoms: No change in energy level No constipation  No diarrhea No heat / cold intolerance  No nervousness No palpitations  No weight changes    -----------------------------------------------------------------------------------------   Patient Active Problem List   Diagnosis Date Noted  . Colon cancer ([place]) 08/21/2016  . Rectal cancer ([place]) 07/30/2016  . Cancer of descending colon ([place]) 07/30/2016  . LBBB (left bundle branch block) 07/24/2016  . Heart murmur, systolic 34/19/3790  . Acid reflux 09/07/2014  . AI (aortic incompetence) 09/07/2014  . Allergic rhinitis 09/07/2014  . Clinical depression 09/07/2014  . Diabetes mellitus type 2 in obese ([place]) 09/07/2014  . Essential (primary) hypertension 09/07/2014  . H/O thyroid disease 09/07/2014  . HLD (hyperlipidemia) 09/07/2014  . Acquired hypothyroidism 09/07/2014  . Adiposity 09/07/2014  . Post menopausal syndrome 09/07/2014  . Basal cell papilloma 09/07/2014  . Avitaminosis D 09/07/2014   Social History   Tobacco Use  . Smoking status: Former Smoker    Packs/day: 0.50    Years: 4.00    Pack years: 2.00    Quit date: 01/26/1971    Years since quitting: 48.5  . Smokeless tobacco: Never Used  Vaping Use  . Vaping Use: Never used  Substance Use Topics  . Alcohol use: Yes    Comment: occasionally with dinner  . Drug use: No       Medications: Outpatient Medications Prior to Visit  Medication Sig  . amLODipine-benazepril (LOTREL) 5-10 MG capsule TAKE 1 CAPSULE BY MOUTH EVERY DAY FOR HIGH BLOOD PRESSURE  . ezetimibe (ZETIA) 10 MG tablet TAKE ONE TABLET BY MOUTH EVERY MORNING  . glipiZIDE (GLUCOTROL) 5 MG tablet TAKE ONE TABLET BY MOUTH EVERY DAY BEFORE BREAKFAST  . hydrochlorothiazide (MICROZIDE) 12.5 MG capsule TAKE 1 CAPSULE BY MOUTH ONCE DAILY  . levothyroxine (SYNTHROID) 75 MCG tablet TAKE ONE TABLET ON  AN EMPTY STOMACH WITHA GLASS OF WATER AT LEAST 30 TO 60 MINUTES BEFORE BREAKFAST  . metoprolol succinate (TOPROL-XL) 50 MG 24 hr tablet TAKE 1 TABLET BY MOUTH DAILY WITH OR IMMEDIATELY FOLLOWING A MEAL  . Multiple Vitamins-Calcium (ONE-A-DAY WOMENS PO) Take by mouth daily.  Marland Kitchen omeprazole (PRILOSEC OTC) 20 MG tablet Take 20 mg by mouth daily as needed.  . pioglitazone (ACTOS) 45 MG tablet TAKE ONE TABLET EVERY DAY  . ferrous sulfate 325 (65 FE) MG tablet Take 325 mg by mouth daily with breakfast.  . loratadine (CLARITIN) 10 MG tablet Take 1 tablet (10 mg total) by mouth daily.  . sertraline (ZOLOFT) 50 MG tablet Take 1 tablet (50 mg total) by mouth daily.   No facility-administered medications prior to visit.    Review of Systems  Constitutional: Negative.   Eyes: Negative.   Respiratory: Negative.   Cardiovascular:  Negative.   Gastrointestinal: Negative.   Endocrine: Negative.   Skin: Negative for wound.  Allergic/Immunologic: Negative.   Neurological: Negative for dizziness, light-headedness and headaches.  Psychiatric/Behavioral: Negative.     Last CBC Lab Results  Component Value Date   WBC 4.5 11/08/2018   HGB 9.7 (L) 11/08/2018   HCT 29.6 (L) 11/08/2018   MCV 98 (H) 11/08/2018   MCH 32.0 11/08/2018   RDW 13.6 11/08/2018   PLT 220 38/75/6433   Last metabolic panel Lab Results  Component Value Date   GLUCOSE 114 (H) 11/08/2018   NA 141 11/08/2018   K 4.4 11/08/2018   CL 107 (H) 11/08/2018   CO2 20 11/08/2018   BUN 40 (H) 11/08/2018   CREATININE 2.29 (H) 11/08/2018   GFRNONAA 20 (L) 11/08/2018   GFRAA 23 (L) 11/08/2018   CALCIUM 9.7 11/08/2018   PROT 6.6 11/08/2018   ALBUMIN 4.2 11/08/2018   LABGLOB 2.4 11/08/2018   AGRATIO 1.8 11/08/2018   BILITOT <0.2 11/08/2018   ALKPHOS 70 11/08/2018   AST 17 11/08/2018   ALT 10 11/08/2018   ANIONGAP 9 09/03/2016   Last lipids Lab Results  Component Value Date   CHOL 235 (H) 11/08/2018   HDL 55 11/08/2018   LDLCALC  138 (H) 11/08/2018   TRIG 234 (H) 11/08/2018   CHOLHDL 4.3 11/08/2018   Last hemoglobin A1c Lab Results  Component Value Date   HGBA1C 5.9 (A) 08/08/2019   Last thyroid functions Lab Results  Component Value Date   TSH 14.400 (H) 11/08/2018   Last vitamin D No results found for: 25OHVITD2, 25OHVITD3, VD25OH Last vitamin B12 and Folate No results found for: VITAMINB12, FOLATE  Objective    BP 116/72 (BP Location: Left Arm, Patient Position: Sitting, Cuff Size: Large)   Pulse 72   Temp (!) 96.9 F (36.1 C) (Temporal)   Wt 175 lb (79.4 kg)   SpO2 95%   BMI 31.00 kg/m  BP Readings from Last 3 Encounters:  08/08/19 116/72  11/07/18 128/70  01/10/18 126/62   Wt Readings from Last 3 Encounters:  08/08/19 175 lb (79.4 kg)  11/07/18 177 lb (80.3 kg)  01/10/18 176 lb (79.8 kg)      Physical Exam Constitutional:      Appearance: Normal appearance.  HENT:     Head: Normocephalic and atraumatic.  Cardiovascular:     Rate and Rhythm: Normal rate and regular rhythm.     Heart sounds: Murmur heard.  Systolic murmur is present with a grade of 2/6.   Pulmonary:     Effort: Pulmonary effort is normal.     Breath sounds: Normal breath sounds.  Abdominal:     General: Bowel sounds are normal.     Tenderness: There is no abdominal tenderness.  Musculoskeletal:     Right lower leg: No edema.     Left lower leg: No edema.  Lymphadenopathy:     Cervical: No cervical adenopathy.  Skin:    General: Skin is warm and dry.  Neurological:     Mental Status: She is alert and oriented to person, place, and time. Mental status is at baseline.  Psychiatric:        Mood and Affect: Mood normal.        Behavior: Behavior normal.        Thought Content: Thought content normal.        Judgment: Judgment normal.       Results for orders placed or performed in visit  on 08/08/19  POCT glycosylated hemoglobin (Hb A1C)  Result Value Ref Range   Hemoglobin A1C 5.9 (A) 4.0 - 5.6 %     Assessment & Plan     Problem List Items Addressed This Visit      Cardiovascular and Mediastinum   Essential (primary) hypertension    Well controlled Continue current medications Recheck metabolic panel F/u in 6 months       Relevant Orders   CBC with Differential/Platelet   Comprehensive metabolic panel     Endocrine   Diabetes mellitus type 2 in obese (HCC)    A1C well controlled at 5.9% Will stop Glipizide 5mg .  Recheck in four months.       Relevant Orders   Comprehensive metabolic panel   Acquired hypothyroidism    Previously uncontrolled. Continue Synthroid at current dose  Recheck TSH and adjust Synthroid as indicated        Relevant Orders   TSH     Other   HLD (hyperlipidemia)    Previously uncontrolled Continue statin Repeat FLP and CMP Goal LDL < 70       Relevant Orders   Lipid panel    Other Visit Diagnoses    Type 2 diabetes mellitus without complication, without long-term current use of insulin (HCC)    -  Primary   Relevant Orders   POCT glycosylated hemoglobin (Hb A1C) (Completed)   CBC with Differential/Platelet   Comprehensive metabolic panel    Rectal cancer Resected in 2018.    Return in about 4 months (around 12/09/2019).      I, Wilhemena Durie, MD, have reviewed all documentation for this visit. The documentation on 08/11/19 for the exam, diagnosis, procedures, and orders are all accurate and complete.    Richard Cranford Mon, MD  [facility] [phone] (phone) [phone] (fax)  [place]

## 2019-08-07 DIAGNOSIS — M9901 Segmental and somatic dysfunction of cervical region: Secondary | ICD-10-CM | POA: Diagnosis not present

## 2019-08-07 DIAGNOSIS — M9902 Segmental and somatic dysfunction of thoracic region: Secondary | ICD-10-CM | POA: Diagnosis not present

## 2019-08-07 DIAGNOSIS — M5134 Other intervertebral disc degeneration, thoracic region: Secondary | ICD-10-CM | POA: Diagnosis not present

## 2019-08-07 DIAGNOSIS — M62830 Muscle spasm of back: Secondary | ICD-10-CM | POA: Diagnosis not present

## 2019-08-08 ENCOUNTER — Ambulatory Visit (INDEPENDENT_AMBULATORY_CARE_PROVIDER_SITE_OTHER): Payer: Medicare Other | Admitting: Family Medicine

## 2019-08-08 ENCOUNTER — Other Ambulatory Visit: Payer: Self-pay

## 2019-08-08 VITALS — BP 116/72 | HR 72 | Temp 96.9°F | Wt 175.0 lb

## 2019-08-08 DIAGNOSIS — I10 Essential (primary) hypertension: Secondary | ICD-10-CM

## 2019-08-08 DIAGNOSIS — E1169 Type 2 diabetes mellitus with other specified complication: Secondary | ICD-10-CM | POA: Diagnosis not present

## 2019-08-08 DIAGNOSIS — C20 Malignant neoplasm of rectum: Secondary | ICD-10-CM

## 2019-08-08 DIAGNOSIS — E039 Hypothyroidism, unspecified: Secondary | ICD-10-CM | POA: Diagnosis not present

## 2019-08-08 DIAGNOSIS — E669 Obesity, unspecified: Secondary | ICD-10-CM

## 2019-08-08 DIAGNOSIS — E119 Type 2 diabetes mellitus without complications: Secondary | ICD-10-CM | POA: Diagnosis not present

## 2019-08-08 DIAGNOSIS — E7849 Other hyperlipidemia: Secondary | ICD-10-CM

## 2019-08-08 LAB — POCT GLYCOSYLATED HEMOGLOBIN (HGB A1C): Hemoglobin A1C: 5.9 % — AB (ref 4.0–5.6)

## 2019-08-08 NOTE — Patient Instructions (Signed)

## 2019-08-08 NOTE — Assessment & Plan Note (Addendum)
A1C well controlled at 5.9% Will stop Glipizide 5mg .  Recheck in four months.

## 2019-08-08 NOTE — Assessment & Plan Note (Signed)
Well controlled Continue current medications Recheck metabolic panel F/u in 6 months  

## 2019-08-08 NOTE — Assessment & Plan Note (Signed)
Previously uncontrolled Continue Synthroid at current dose  Recheck TSH and adjust Synthroid as indicated   

## 2019-08-08 NOTE — Assessment & Plan Note (Signed)
Previously uncontrolled Continue statin Repeat FLP and CMP Goal LDL < 70  

## 2019-08-16 DIAGNOSIS — M9902 Segmental and somatic dysfunction of thoracic region: Secondary | ICD-10-CM | POA: Diagnosis not present

## 2019-08-16 DIAGNOSIS — M5134 Other intervertebral disc degeneration, thoracic region: Secondary | ICD-10-CM | POA: Diagnosis not present

## 2019-08-16 DIAGNOSIS — M62830 Muscle spasm of back: Secondary | ICD-10-CM | POA: Diagnosis not present

## 2019-08-16 DIAGNOSIS — M9901 Segmental and somatic dysfunction of cervical region: Secondary | ICD-10-CM | POA: Diagnosis not present

## 2019-08-17 DIAGNOSIS — I10 Essential (primary) hypertension: Secondary | ICD-10-CM | POA: Diagnosis not present

## 2019-08-17 DIAGNOSIS — E1169 Type 2 diabetes mellitus with other specified complication: Secondary | ICD-10-CM | POA: Diagnosis not present

## 2019-08-17 DIAGNOSIS — E119 Type 2 diabetes mellitus without complications: Secondary | ICD-10-CM | POA: Diagnosis not present

## 2019-08-17 DIAGNOSIS — E7849 Other hyperlipidemia: Secondary | ICD-10-CM | POA: Diagnosis not present

## 2019-08-18 ENCOUNTER — Telehealth: Payer: Self-pay

## 2019-08-18 LAB — COMPREHENSIVE METABOLIC PANEL
ALT: 10 IU/L (ref 0–32)
AST: 16 IU/L (ref 0–40)
Albumin/Globulin Ratio: 1.8 (ref 1.2–2.2)
Albumin: 4.1 g/dL (ref 3.6–4.6)
Alkaline Phosphatase: 74 IU/L (ref 48–121)
BUN/Creatinine Ratio: 19 (ref 12–28)
BUN: 42 mg/dL — ABNORMAL HIGH (ref 8–27)
Bilirubin Total: <0.2 mg/dL (ref 0.0–1.2)
CO2: 21 mmol/L (ref 20–29)
Calcium: 9.3 mg/dL (ref 8.7–10.3)
Chloride: 106 mmol/L (ref 96–106)
Creatinine, Ser: 2.18 mg/dL — ABNORMAL HIGH (ref 0.57–1.00)
GFR calc Af Amer: 24 mL/min/{1.73_m2} — ABNORMAL LOW (ref >59)
GFR calc non Af Amer: 21 mL/min/{1.73_m2} — ABNORMAL LOW (ref >59)
Globulin, Total: 2.3 g/dL (ref 1.5–4.5)
Glucose: 100 mg/dL — ABNORMAL HIGH (ref 65–99)
Potassium: 4.6 mmol/L (ref 3.5–5.2)
Sodium: 141 mmol/L (ref 134–144)
Total Protein: 6.4 g/dL (ref 6.0–8.5)

## 2019-08-18 LAB — LIPID PANEL
Chol/HDL Ratio: 4 ratio (ref 0.0–4.4)
Cholesterol, Total: 217 mg/dL — ABNORMAL HIGH (ref 100–199)
HDL: 54 mg/dL (ref >39)
LDL Chol Calc (NIH): 128 mg/dL — ABNORMAL HIGH (ref 0–99)
Triglycerides: 201 mg/dL — ABNORMAL HIGH (ref 0–149)
VLDL Cholesterol Cal: 35 mg/dL (ref 5–40)

## 2019-08-18 LAB — CBC WITH DIFFERENTIAL/PLATELET
Basophils Absolute: 0 10*3/uL (ref 0.0–0.2)
Basos: 1 %
EOS (ABSOLUTE): 0.2 10*3/uL (ref 0.0–0.4)
Eos: 5 %
Hematocrit: 29.3 % — ABNORMAL LOW (ref 34.0–46.6)
Hemoglobin: 9.4 g/dL — ABNORMAL LOW (ref 11.1–15.9)
Immature Grans (Abs): 0 10*3/uL (ref 0.0–0.1)
Immature Granulocytes: 0 %
Lymphocytes Absolute: 1.1 10*3/uL (ref 0.7–3.1)
Lymphs: 35 %
MCH: 31.3 pg (ref 26.6–33.0)
MCHC: 32.1 g/dL (ref 31.5–35.7)
MCV: 98 fL — ABNORMAL HIGH (ref 79–97)
Monocytes Absolute: 0.4 10*3/uL (ref 0.1–0.9)
Monocytes: 11 %
Neutrophils Absolute: 1.6 10*3/uL (ref 1.4–7.0)
Neutrophils: 48 %
Platelets: 215 10*3/uL (ref 150–450)
RBC: 3 x10E6/uL — ABNORMAL LOW (ref 3.77–5.28)
RDW: 14 % (ref 11.7–15.4)
WBC: 3.3 10*3/uL — ABNORMAL LOW (ref 3.4–10.8)

## 2019-08-18 LAB — TSH: TSH: 11.7 u[IU]/mL — ABNORMAL HIGH (ref 0.450–4.500)

## 2019-08-18 NOTE — Telephone Encounter (Signed)
-----   Message from Jerrol Banana., MD sent at 08/18/2019 12:43 PM EDT ----- Labs stable for now.

## 2019-08-18 NOTE — Telephone Encounter (Signed)
Called to advise patient of her lab results.

## 2019-08-21 DIAGNOSIS — M5134 Other intervertebral disc degeneration, thoracic region: Secondary | ICD-10-CM | POA: Diagnosis not present

## 2019-08-21 DIAGNOSIS — M9901 Segmental and somatic dysfunction of cervical region: Secondary | ICD-10-CM | POA: Diagnosis not present

## 2019-08-21 DIAGNOSIS — M62830 Muscle spasm of back: Secondary | ICD-10-CM | POA: Diagnosis not present

## 2019-08-21 DIAGNOSIS — M9902 Segmental and somatic dysfunction of thoracic region: Secondary | ICD-10-CM | POA: Diagnosis not present

## 2019-08-23 DIAGNOSIS — M9902 Segmental and somatic dysfunction of thoracic region: Secondary | ICD-10-CM | POA: Diagnosis not present

## 2019-08-23 DIAGNOSIS — M62830 Muscle spasm of back: Secondary | ICD-10-CM | POA: Diagnosis not present

## 2019-08-23 DIAGNOSIS — M9901 Segmental and somatic dysfunction of cervical region: Secondary | ICD-10-CM | POA: Diagnosis not present

## 2019-08-23 DIAGNOSIS — M5134 Other intervertebral disc degeneration, thoracic region: Secondary | ICD-10-CM | POA: Diagnosis not present

## 2019-08-29 DIAGNOSIS — M5134 Other intervertebral disc degeneration, thoracic region: Secondary | ICD-10-CM | POA: Diagnosis not present

## 2019-08-29 DIAGNOSIS — M62830 Muscle spasm of back: Secondary | ICD-10-CM | POA: Diagnosis not present

## 2019-08-29 DIAGNOSIS — M9901 Segmental and somatic dysfunction of cervical region: Secondary | ICD-10-CM | POA: Diagnosis not present

## 2019-08-29 DIAGNOSIS — M9902 Segmental and somatic dysfunction of thoracic region: Secondary | ICD-10-CM | POA: Diagnosis not present

## 2019-09-07 ENCOUNTER — Ambulatory Visit: Payer: Self-pay | Admitting: Family Medicine

## 2019-09-26 DIAGNOSIS — M9902 Segmental and somatic dysfunction of thoracic region: Secondary | ICD-10-CM | POA: Diagnosis not present

## 2019-09-26 DIAGNOSIS — M9901 Segmental and somatic dysfunction of cervical region: Secondary | ICD-10-CM | POA: Diagnosis not present

## 2019-09-26 DIAGNOSIS — M5134 Other intervertebral disc degeneration, thoracic region: Secondary | ICD-10-CM | POA: Diagnosis not present

## 2019-09-26 DIAGNOSIS — M62830 Muscle spasm of back: Secondary | ICD-10-CM | POA: Diagnosis not present

## 2019-10-04 ENCOUNTER — Other Ambulatory Visit: Payer: Self-pay | Admitting: Family Medicine

## 2019-10-04 DIAGNOSIS — E119 Type 2 diabetes mellitus without complications: Secondary | ICD-10-CM

## 2019-10-04 MED ORDER — PIOGLITAZONE HCL 45 MG PO TABS: 45.0000 mg | ORAL_TABLET | Freq: Every day | ORAL | 3 refills | Status: DC

## 2019-10-04 NOTE — Telephone Encounter (Signed)
[facility] is requesting refills on Pioglitazone HCL 45 mg.

## 2019-10-04 NOTE — Telephone Encounter (Signed)
Patient's pharmacy requested refill, refill sent to patient's pharmacy.

## 2019-10-16 DIAGNOSIS — M62830 Muscle spasm of back: Secondary | ICD-10-CM | POA: Diagnosis not present

## 2019-10-16 DIAGNOSIS — M9901 Segmental and somatic dysfunction of cervical region: Secondary | ICD-10-CM | POA: Diagnosis not present

## 2019-10-16 DIAGNOSIS — M5134 Other intervertebral disc degeneration, thoracic region: Secondary | ICD-10-CM | POA: Diagnosis not present

## 2019-10-16 DIAGNOSIS — M9902 Segmental and somatic dysfunction of thoracic region: Secondary | ICD-10-CM | POA: Diagnosis not present

## 2019-10-19 DIAGNOSIS — M9901 Segmental and somatic dysfunction of cervical region: Secondary | ICD-10-CM | POA: Diagnosis not present

## 2019-10-19 DIAGNOSIS — M9902 Segmental and somatic dysfunction of thoracic region: Secondary | ICD-10-CM | POA: Diagnosis not present

## 2019-10-19 DIAGNOSIS — M62830 Muscle spasm of back: Secondary | ICD-10-CM | POA: Diagnosis not present

## 2019-10-19 DIAGNOSIS — M5134 Other intervertebral disc degeneration, thoracic region: Secondary | ICD-10-CM | POA: Diagnosis not present

## 2019-11-07 DIAGNOSIS — M62830 Muscle spasm of back: Secondary | ICD-10-CM | POA: Diagnosis not present

## 2019-11-07 DIAGNOSIS — M9902 Segmental and somatic dysfunction of thoracic region: Secondary | ICD-10-CM | POA: Diagnosis not present

## 2019-11-07 DIAGNOSIS — M9901 Segmental and somatic dysfunction of cervical region: Secondary | ICD-10-CM | POA: Diagnosis not present

## 2019-11-07 DIAGNOSIS — M5134 Other intervertebral disc degeneration, thoracic region: Secondary | ICD-10-CM | POA: Diagnosis not present

## 2019-11-13 ENCOUNTER — Ambulatory Visit (INDEPENDENT_AMBULATORY_CARE_PROVIDER_SITE_OTHER): Payer: Medicare Other | Admitting: Family Medicine

## 2019-11-13 ENCOUNTER — Other Ambulatory Visit: Payer: Self-pay

## 2019-11-13 DIAGNOSIS — Z23 Encounter for immunization: Secondary | ICD-10-CM | POA: Diagnosis not present

## 2019-11-14 DIAGNOSIS — M9902 Segmental and somatic dysfunction of thoracic region: Secondary | ICD-10-CM | POA: Diagnosis not present

## 2019-11-14 DIAGNOSIS — M62830 Muscle spasm of back: Secondary | ICD-10-CM | POA: Diagnosis not present

## 2019-11-14 DIAGNOSIS — M5134 Other intervertebral disc degeneration, thoracic region: Secondary | ICD-10-CM | POA: Diagnosis not present

## 2019-11-14 DIAGNOSIS — M9901 Segmental and somatic dysfunction of cervical region: Secondary | ICD-10-CM | POA: Diagnosis not present

## 2019-11-21 DIAGNOSIS — M62830 Muscle spasm of back: Secondary | ICD-10-CM | POA: Diagnosis not present

## 2019-11-21 DIAGNOSIS — M9902 Segmental and somatic dysfunction of thoracic region: Secondary | ICD-10-CM | POA: Diagnosis not present

## 2019-11-21 DIAGNOSIS — M5134 Other intervertebral disc degeneration, thoracic region: Secondary | ICD-10-CM | POA: Diagnosis not present

## 2019-11-21 DIAGNOSIS — M9901 Segmental and somatic dysfunction of cervical region: Secondary | ICD-10-CM | POA: Diagnosis not present

## 2019-11-23 ENCOUNTER — Ambulatory Visit: Payer: Self-pay | Admitting: *Deleted

## 2019-11-23 NOTE — Telephone Encounter (Signed)
Caller called to confirm she is supposed to stop taking her glipizide 5 mg.Per Dr. Alben Spittle note, A1c was 5.9% , notes to stop glipizide 5 mg and to f/u in 4 months. appt scheduled for 12/02/19. Caller reports she has no high or  low glucose symptoms. Care advise given. Patient verbalized understanding and to call back if needed.   Reason for Disposition . [1] Caller requesting NON-URGENT health information AND [2] PCP's office is the best resource  Answer Assessment - Initial Assessment Questions 1. REASON FOR CALL or QUESTION: "What is your reason for calling today?" or "How can I best help you?" or "What question do you have that I can help answer?"     Why did PCP tell me to stop my glipizide?  Protocols used: INFORMATION ONLY CALL - NO TRIAGE-A-AH

## 2019-12-04 ENCOUNTER — Other Ambulatory Visit: Payer: Self-pay | Admitting: Family Medicine

## 2019-12-04 NOTE — Telephone Encounter (Signed)
Requested Prescriptions  Pending Prescriptions Disp Refills  . levothyroxine (SYNTHROID) 75 MCG tablet [Pharmacy Med Name: LEVOTHYROXINE SODIUM 75 MCG TAB] 30 tablet 12    Sig: TAKE ONE TABLET ON AN EMPTY STOMACH WITHA GLASS OF WATER AT LEAST 30 TO 55 MINUTES BEFORE BREAKFAST     Endocrinology:  Hypothyroid Agents Failed - 12/04/2019 12:50 PM      Failed - TSH needs to be rechecked within 3 months after an abnormal result. Refill until TSH is due.      Failed - TSH in normal range and within 360 days    TSH  Date Value Ref Range Status  08/17/2019 11.700 (H) 0.450 - 4.500 uIU/mL Final         Passed - Valid encounter within last 12 months    Recent Outpatient Visits          3 months ago Type 2 diabetes mellitus without complication, without long-term current use of insulin [facility])   [facility] Jerrol Banana., MD   1 year ago Essential (primary) hypertension   [facility] Jerrol Banana., MD   1 year ago Essential (primary) hypertension   [facility] Jerrol Banana., MD   2 years ago CKD (chronic kidney disease) stage 3, GFR 30-59 ml/min [facility])   [facility] Jerrol Banana., MD   2 years ago Diabetes mellitus type 2 in obese [facility])   [facility] Jerrol Banana., MD      Future Appointments            In 1 week Jerrol Banana., MD [facility]

## 2019-12-05 DIAGNOSIS — M9901 Segmental and somatic dysfunction of cervical region: Secondary | ICD-10-CM | POA: Diagnosis not present

## 2019-12-05 DIAGNOSIS — M9902 Segmental and somatic dysfunction of thoracic region: Secondary | ICD-10-CM | POA: Diagnosis not present

## 2019-12-05 DIAGNOSIS — M62830 Muscle spasm of back: Secondary | ICD-10-CM | POA: Diagnosis not present

## 2019-12-05 DIAGNOSIS — M5134 Other intervertebral disc degeneration, thoracic region: Secondary | ICD-10-CM | POA: Diagnosis not present

## 2019-12-12 ENCOUNTER — Other Ambulatory Visit: Payer: Self-pay

## 2019-12-12 ENCOUNTER — Ambulatory Visit (INDEPENDENT_AMBULATORY_CARE_PROVIDER_SITE_OTHER): Payer: Medicare Other | Admitting: Family Medicine

## 2019-12-12 ENCOUNTER — Encounter: Payer: Self-pay | Admitting: Family Medicine

## 2019-12-12 VITALS — BP 119/70 | HR 80 | Temp 97.7°F | Resp 16 | Ht 63.0 in | Wt 174.0 lb

## 2019-12-12 DIAGNOSIS — Z23 Encounter for immunization: Secondary | ICD-10-CM

## 2019-12-12 DIAGNOSIS — E119 Type 2 diabetes mellitus without complications: Secondary | ICD-10-CM

## 2019-12-12 DIAGNOSIS — I10 Essential (primary) hypertension: Secondary | ICD-10-CM | POA: Diagnosis not present

## 2019-12-12 DIAGNOSIS — C187 Malignant neoplasm of sigmoid colon: Secondary | ICD-10-CM | POA: Diagnosis not present

## 2019-12-12 DIAGNOSIS — Z6830 Body mass index (BMI) 30.0-30.9, adult: Secondary | ICD-10-CM

## 2019-12-12 DIAGNOSIS — E7849 Other hyperlipidemia: Secondary | ICD-10-CM | POA: Diagnosis not present

## 2019-12-12 DIAGNOSIS — E6609 Other obesity due to excess calories: Secondary | ICD-10-CM

## 2019-12-12 LAB — POCT GLYCOSYLATED HEMOGLOBIN (HGB A1C)
Est. average glucose Bld gHb Est-mCnc: 137
Hemoglobin A1C: 6.4 % — AB (ref 4.0–5.6)

## 2019-12-12 NOTE — Progress Notes (Signed)
I,April Miller,acting as a scribe for Wilhemena Durie, MD.,have documented all relevant documentation on the behalf of Wilhemena Durie, MD,as directed by  Wilhemena Durie, MD while in the presence of Wilhemena Durie, MD.   Established patient visit   Patient: Tracy Silva   DOB: November 08, 1940   79 y.o. Female  MRN: [id number] Visit Date: 12/12/2019  Today's healthcare provider: Wilhemena Durie, MD   Chief Complaint  Patient presents with  . Diabetes  . Follow-up  . Hypertension   Subjective    HPI  Overall is feeling well. In discussing her occasions she states she did have a myopathy  from statin. Diabetes Mellitus Type II, follow-up  Lab Results  Component Value Date   HGBA1C 6.4 (A) 12/12/2019   HGBA1C 5.9 (A) 08/08/2019   HGBA1C 6.5 (H) 10/26/2017   Last seen for diabetes 4 months ago.  Management since then includes; Will stop Glipizide 5mg .  She reports good compliance with treatment. She is not having side effects. none  Home blood sugar records: fasting range: not checking  Episodes of hypoglycemia? No none   Current insulin regiment: n/a Most Recent Eye Exam: 3/21  -------------------------------------------------------------------- Hypertension, follow-up  BP Readings from Last 3 Encounters:  12/12/19 119/70  08/08/19 116/72  11/07/18 128/70   Wt Readings from Last 3 Encounters:  12/12/19 174 lb (78.9 kg)  08/08/19 175 lb (79.4 kg)  11/07/18 177 lb (80.3 kg)     She was last seen for hypertension 4 months ago.  BP at that visit was 116/72. Management since that visit includes; Well controlled. She reports good compliance with treatment. She is not having side effects. none She is not exercising. She is adherent to low salt diet.   Outside blood pressures are not checking.  She does not smoke.  Use of agents associated with hypertension: none.   -------------------------------------------------------------------        Medications: Outpatient Medications Prior to Visit  Medication Sig  . amLODipine-benazepril (LOTREL) 5-10 MG capsule TAKE 1 CAPSULE BY MOUTH EVERY DAY FOR HIGH BLOOD PRESSURE  . hydrochlorothiazide (MICROZIDE) 12.5 MG capsule TAKE 1 CAPSULE BY MOUTH ONCE DAILY  . levothyroxine (SYNTHROID) 75 MCG tablet TAKE ONE TABLET ON AN EMPTY STOMACH WITHA GLASS OF WATER AT LEAST 30 TO 60 MINUTES BEFORE BREAKFAST  . metoprolol succinate (TOPROL-XL) 50 MG 24 hr tablet TAKE 1 TABLET BY MOUTH DAILY WITH OR IMMEDIATELY FOLLOWING A MEAL  . Multiple Vitamins-Calcium (ONE-A-DAY WOMENS PO) Take by mouth daily.  Marland Kitchen omeprazole (PRILOSEC OTC) 20 MG tablet Take 20 mg by mouth daily as needed.  . pioglitazone (ACTOS) 45 MG tablet Take 1 tablet (45 mg total) by mouth daily.  Marland Kitchen ezetimibe (ZETIA) 10 MG tablet TAKE ONE TABLET BY MOUTH EVERY MORNING (Patient not taking: Reported on 12/12/2019)  . ferrous sulfate 325 (65 FE) MG tablet Take 325 mg by mouth daily with breakfast. (Patient not taking: Reported on 12/12/2019)  . glipiZIDE (GLUCOTROL) 5 MG tablet TAKE ONE TABLET BY MOUTH EVERY DAY BEFORE BREAKFAST (Patient not taking: Reported on 12/12/2019)  . loratadine (CLARITIN) 10 MG tablet Take 1 tablet (10 mg total) by mouth daily. (Patient not taking: Reported on 12/12/2019)  . sertraline (ZOLOFT) 50 MG tablet Take 1 tablet (50 mg total) by mouth daily. (Patient not taking: Reported on 12/12/2019)   No facility-administered medications prior to visit.    Review of Systems  Constitutional: Negative for appetite change, chills, fatigue and fever.  Respiratory:  Negative for chest tightness and shortness of breath.   Cardiovascular: Negative for chest pain and palpitations.  Gastrointestinal: Negative for abdominal pain, nausea and vomiting.  Neurological: Negative for dizziness and weakness.       Objective    BP 119/70 (BP Location: Left Arm, Cuff Size: Large)   Pulse 80   Temp 97.7 F (36.5 C) (Oral)   Resp 16    Ht 5\' 3"  (1.6 m)   Wt 174 lb (78.9 kg)   SpO2 98%   BMI 30.82 kg/m  BP Readings from Last 3 Encounters:  12/12/19 119/70  08/08/19 116/72  11/07/18 128/70   Wt Readings from Last 3 Encounters:  12/12/19 174 lb (78.9 kg)  08/08/19 175 lb (79.4 kg)  11/07/18 177 lb (80.3 kg)      Physical Exam Vitals reviewed.  Constitutional:      Appearance: Normal appearance.  HENT:     Head: Normocephalic and atraumatic.  Cardiovascular:     Rate and Rhythm: Normal rate and regular rhythm.     Heart sounds: Murmur heard.  Systolic murmur is present with a grade of 2/6.   Pulmonary:     Effort: Pulmonary effort is normal.     Breath sounds: Normal breath sounds.  Abdominal:     General: Bowel sounds are normal.     Tenderness: There is no abdominal tenderness.  Musculoskeletal:     Right lower leg: No edema.     Left lower leg: No edema.  Lymphadenopathy:     Cervical: No cervical adenopathy.  Skin:    General: Skin is warm and dry.  Neurological:     Mental Status: She is alert and oriented to person, place, and time. Mental status is at baseline.  Psychiatric:        Mood and Affect: Mood normal.        Behavior: Behavior normal.        Thought Content: Thought content normal.        Judgment: Judgment normal.       Results for orders placed or performed in visit on 12/12/19  POCT glycosylated hemoglobin (Hb A1C)  Result Value Ref Range   Hemoglobin A1C 6.4 (A) 4.0 - 5.6 %   Est. average glucose Bld gHb Est-mCnc 137   POCT UA - Microalbumin  Result Value Ref Range   Microalbumin Ur, POC Negative mg/L    Assessment & Plan     1. Type 2 diabetes mellitus without complication, without long-term current use of insulin (HCC) A1c under good control at 6.4. Letter stopping glipizide - POCT glycosylated hemoglobin (Hb A1C) - POCT UA - Microalbumin  2. Essential (primary) hypertension Blood pressure too well controlled. Watch for hypotension and possibly change  medications on amlodipine and benazepril and HCTZ. And metoprolol. Definitely consider stopping HCTZ  3. Encounter for immunization  - [employer] Vaccine  4. Malignant neoplasm of sigmoid colon ([place]) Cancer followed by surgery  5. Other hyperlipidemia Anxiety has she is statin intolerant.  6. Class 1 obesity due to excess calories with serious comorbidity and body mass index (BMI) of 30.0 to 30.9 in adult    No follow-ups on file.         Tracy Silva Mon, MD  [facility] [phone] (phone) [phone] (fax)  [place]

## 2019-12-19 LAB — POCT UA - MICROALBUMIN: Microalbumin Ur, POC: NEGATIVE mg/L

## 2019-12-25 DIAGNOSIS — M9901 Segmental and somatic dysfunction of cervical region: Secondary | ICD-10-CM | POA: Diagnosis not present

## 2019-12-25 DIAGNOSIS — M5134 Other intervertebral disc degeneration, thoracic region: Secondary | ICD-10-CM | POA: Diagnosis not present

## 2019-12-25 DIAGNOSIS — M62830 Muscle spasm of back: Secondary | ICD-10-CM | POA: Diagnosis not present

## 2019-12-25 DIAGNOSIS — M9902 Segmental and somatic dysfunction of thoracic region: Secondary | ICD-10-CM | POA: Diagnosis not present

## 2020-01-08 DIAGNOSIS — M62830 Muscle spasm of back: Secondary | ICD-10-CM | POA: Diagnosis not present

## 2020-01-08 DIAGNOSIS — M9902 Segmental and somatic dysfunction of thoracic region: Secondary | ICD-10-CM | POA: Diagnosis not present

## 2020-01-08 DIAGNOSIS — M5134 Other intervertebral disc degeneration, thoracic region: Secondary | ICD-10-CM | POA: Diagnosis not present

## 2020-01-08 DIAGNOSIS — M9901 Segmental and somatic dysfunction of cervical region: Secondary | ICD-10-CM | POA: Diagnosis not present

## 2020-01-15 DIAGNOSIS — M62830 Muscle spasm of back: Secondary | ICD-10-CM | POA: Diagnosis not present

## 2020-01-15 DIAGNOSIS — M9901 Segmental and somatic dysfunction of cervical region: Secondary | ICD-10-CM | POA: Diagnosis not present

## 2020-01-15 DIAGNOSIS — M9902 Segmental and somatic dysfunction of thoracic region: Secondary | ICD-10-CM | POA: Diagnosis not present

## 2020-01-15 DIAGNOSIS — M5134 Other intervertebral disc degeneration, thoracic region: Secondary | ICD-10-CM | POA: Diagnosis not present

## 2020-01-22 DIAGNOSIS — M9901 Segmental and somatic dysfunction of cervical region: Secondary | ICD-10-CM | POA: Diagnosis not present

## 2020-01-22 DIAGNOSIS — M5134 Other intervertebral disc degeneration, thoracic region: Secondary | ICD-10-CM | POA: Diagnosis not present

## 2020-01-22 DIAGNOSIS — M9902 Segmental and somatic dysfunction of thoracic region: Secondary | ICD-10-CM | POA: Diagnosis not present

## 2020-01-22 DIAGNOSIS — M62830 Muscle spasm of back: Secondary | ICD-10-CM | POA: Diagnosis not present

## 2020-02-06 DIAGNOSIS — M9901 Segmental and somatic dysfunction of cervical region: Secondary | ICD-10-CM | POA: Diagnosis not present

## 2020-02-06 DIAGNOSIS — M62830 Muscle spasm of back: Secondary | ICD-10-CM | POA: Diagnosis not present

## 2020-02-06 DIAGNOSIS — M9902 Segmental and somatic dysfunction of thoracic region: Secondary | ICD-10-CM | POA: Diagnosis not present

## 2020-02-06 DIAGNOSIS — M5134 Other intervertebral disc degeneration, thoracic region: Secondary | ICD-10-CM | POA: Diagnosis not present

## 2020-02-26 ENCOUNTER — Other Ambulatory Visit: Payer: Self-pay | Admitting: Family Medicine

## 2020-03-12 ENCOUNTER — Telehealth: Payer: Self-pay

## 2020-03-12 DIAGNOSIS — M62830 Muscle spasm of back: Secondary | ICD-10-CM | POA: Diagnosis not present

## 2020-03-12 DIAGNOSIS — M9902 Segmental and somatic dysfunction of thoracic region: Secondary | ICD-10-CM | POA: Diagnosis not present

## 2020-03-12 DIAGNOSIS — M5134 Other intervertebral disc degeneration, thoracic region: Secondary | ICD-10-CM | POA: Diagnosis not present

## 2020-03-12 DIAGNOSIS — M9901 Segmental and somatic dysfunction of cervical region: Secondary | ICD-10-CM | POA: Diagnosis not present

## 2020-03-12 NOTE — Telephone Encounter (Signed)
Copied from [place] [id number]. Topic: General - Other >> Mar 12, 2020 11:13 AM Leward Quan A wrote: Reason for CRM: Patient called in to inquire of Dr Rosanna Randy which medication he took her off of a few months ago. Need to know if it was glipiZIDE (GLUCOTROL) 5 MG tablet  or  pioglitazone (ACTOS) 45 MG tablet. Would like a call back from the nurse so that she can inform the pharmacy not to fill this medication anymore. Please call Ph#  [phone]

## 2020-03-12 NOTE — Telephone Encounter (Signed)
Per office note from 12/12/2019, stopped glipizide.

## 2020-03-13 NOTE — Telephone Encounter (Signed)
Advised 

## 2020-03-13 NOTE — Telephone Encounter (Signed)
LMOVM for pt to return call 

## 2020-03-25 ENCOUNTER — Other Ambulatory Visit: Payer: Self-pay | Admitting: Family Medicine

## 2020-03-25 DIAGNOSIS — I10 Essential (primary) hypertension: Secondary | ICD-10-CM

## 2020-03-27 DIAGNOSIS — M5134 Other intervertebral disc degeneration, thoracic region: Secondary | ICD-10-CM | POA: Diagnosis not present

## 2020-03-27 DIAGNOSIS — M9901 Segmental and somatic dysfunction of cervical region: Secondary | ICD-10-CM | POA: Diagnosis not present

## 2020-03-27 DIAGNOSIS — M9902 Segmental and somatic dysfunction of thoracic region: Secondary | ICD-10-CM | POA: Diagnosis not present

## 2020-03-27 DIAGNOSIS — M62830 Muscle spasm of back: Secondary | ICD-10-CM | POA: Diagnosis not present

## 2020-04-03 DIAGNOSIS — M62830 Muscle spasm of back: Secondary | ICD-10-CM | POA: Diagnosis not present

## 2020-04-03 DIAGNOSIS — M9901 Segmental and somatic dysfunction of cervical region: Secondary | ICD-10-CM | POA: Diagnosis not present

## 2020-04-03 DIAGNOSIS — M9902 Segmental and somatic dysfunction of thoracic region: Secondary | ICD-10-CM | POA: Diagnosis not present

## 2020-04-03 DIAGNOSIS — M5134 Other intervertebral disc degeneration, thoracic region: Secondary | ICD-10-CM | POA: Diagnosis not present

## 2020-04-10 ENCOUNTER — Ambulatory Visit: Payer: Self-pay | Admitting: Family Medicine

## 2020-04-11 ENCOUNTER — Encounter: Payer: Self-pay | Admitting: Family Medicine

## 2020-04-11 ENCOUNTER — Other Ambulatory Visit: Payer: Self-pay

## 2020-04-11 ENCOUNTER — Ambulatory Visit (INDEPENDENT_AMBULATORY_CARE_PROVIDER_SITE_OTHER): Payer: Medicare Other | Admitting: Family Medicine

## 2020-04-11 VITALS — BP 129/51 | HR 69 | Temp 98.6°F | Resp 16 | Ht 63.0 in | Wt 169.0 lb

## 2020-04-11 DIAGNOSIS — I351 Nonrheumatic aortic (valve) insufficiency: Secondary | ICD-10-CM | POA: Diagnosis not present

## 2020-04-11 DIAGNOSIS — E119 Type 2 diabetes mellitus without complications: Secondary | ICD-10-CM | POA: Diagnosis not present

## 2020-04-11 DIAGNOSIS — C187 Malignant neoplasm of sigmoid colon: Secondary | ICD-10-CM | POA: Diagnosis not present

## 2020-04-11 DIAGNOSIS — Z6830 Body mass index (BMI) 30.0-30.9, adult: Secondary | ICD-10-CM

## 2020-04-11 DIAGNOSIS — E6609 Other obesity due to excess calories: Secondary | ICD-10-CM

## 2020-04-11 DIAGNOSIS — E7800 Pure hypercholesterolemia, unspecified: Secondary | ICD-10-CM | POA: Diagnosis not present

## 2020-04-11 LAB — POCT GLYCOSYLATED HEMOGLOBIN (HGB A1C): Hemoglobin A1C: 6.3 % — AB (ref 4.0–5.6)

## 2020-04-11 NOTE — Progress Notes (Signed)
Established patient visit   Patient: Tracy Silva   DOB: 02-17-1940   80 y.o. Female  MRN: [id number] Visit Date: 04/11/2020  Today's healthcare provider: Wilhemena Durie, MD   Chief Complaint  Patient presents with  . Diabetes  . Hypertension   Subjective    HPI  Patient comes in today for follow-up.  She has extreme stress as her husband is in failing health and also has dementia with some behavioral disturbance. Diabetes Mellitus Type II, follow-up  Lab Results  Component Value Date   HGBA1C 6.3 (A) 04/11/2020   HGBA1C 6.4 (A) 12/12/2019   HGBA1C 5.9 (A) 08/08/2019   Last seen for diabetes 4 months ago.  Management since then includes; A1c under good control at 6.4. Letter stopping glipizide. She reports good compliance with treatment. She is not having side effects.   Home blood sugar records: not being checked  Episodes of hypoglycemia? No    Current insulin regiment: none Most Recent Eye Exam: due   Hypertension, follow-up  BP Readings from Last 3 Encounters:  04/11/20 (!) 129/51  12/12/19 119/70  08/08/19 116/72   Wt Readings from Last 3 Encounters:  04/11/20 169 lb (76.7 kg)  12/12/19 174 lb (78.9 kg)  08/08/19 175 lb (79.4 kg)     She was last seen for hypertension 4 months ago.  BP at that visit was 119/70. Management since that visit includes; Blood pressure too well controlled. Watch for hypotension and possibly change medications on amlodipine and benazepril and HCTZ. And metoprolol. Definitely consider stopping HCTZ. She reports good compliance with treatment. She is not having side effects.  She is not exercising. She is adherent to low salt diet.   Outside blood pressures are checked occasionally.  She does not smoke.  Use of agents associated with hypertension: none.        Medications: Outpatient Medications Prior to Visit  Medication Sig  . amLODipine-benazepril (LOTREL) 5-10 MG capsule TAKE 1 CAPSULE BY MOUTH EVERY  DAY FOR HIGH BLOOD PRESSURE  . ezetimibe (ZETIA) 10 MG tablet TAKE ONE TABLET BY MOUTH EVERY MORNING  . hydrochlorothiazide (MICROZIDE) 12.5 MG capsule TAKE 1 CAPSULE BY MOUTH ONCE DAILY  . levothyroxine (SYNTHROID) 75 MCG tablet TAKE ONE TABLET ON AN EMPTY STOMACH WITHA GLASS OF WATER AT LEAST 30 TO 60 MINUTES BEFORE BREAKFAST  . metoprolol succinate (TOPROL-XL) 50 MG 24 hr tablet TAKE 1 TABLET BY MOUTH DAILY WITH OR IMMEDIATELY FOLLOWING A MEAL  . Multiple Vitamins-Calcium (ONE-A-DAY WOMENS PO) Take by mouth daily.  Marland Kitchen omeprazole (PRILOSEC OTC) 20 MG tablet Take 20 mg by mouth daily as needed.  . pioglitazone (ACTOS) 45 MG tablet Take 1 tablet (45 mg total) by mouth daily.  . ferrous sulfate 325 (65 FE) MG tablet Take 325 mg by mouth daily with breakfast.  . glipiZIDE (GLUCOTROL) 5 MG tablet TAKE ONE TABLET BY MOUTH EVERY DAY BEFORE BREAKFAST  . loratadine (CLARITIN) 10 MG tablet Take 1 tablet (10 mg total) by mouth daily.  . sertraline (ZOLOFT) 50 MG tablet Take 1 tablet (50 mg total) by mouth daily.   No facility-administered medications prior to visit.    Review of Systems  Constitutional: Negative for appetite change, chills, fatigue and fever.  Respiratory: Negative for chest tightness and shortness of breath.   Cardiovascular: Negative for chest pain and palpitations.  Gastrointestinal: Negative for abdominal pain, nausea and vomiting.  Neurological: Negative for dizziness and weakness.  Objective    BP (!) 129/51   Pulse 69   Temp 98.6 F (37 C)   Resp 16   Ht 5\' 3"  (1.6 m)   Wt 169 lb (76.7 kg)   BMI 29.94 kg/m  BP Readings from Last 3 Encounters:  04/11/20 (!) 129/51  12/12/19 119/70  08/08/19 116/72   Wt Readings from Last 3 Encounters:  04/11/20 169 lb (76.7 kg)  12/12/19 174 lb (78.9 kg)  08/08/19 175 lb (79.4 kg)       Physical Exam Vitals reviewed.  Constitutional:      Appearance: Normal appearance.  HENT:     Head: Normocephalic and  atraumatic.  Cardiovascular:     Rate and Rhythm: Normal rate and regular rhythm.     Heart sounds: Murmur heard.   Systolic murmur is present with a grade of 2/6.   Pulmonary:     Effort: Pulmonary effort is normal.     Breath sounds: Normal breath sounds.  Abdominal:     General: Bowel sounds are normal.     Tenderness: There is no abdominal tenderness.  Musculoskeletal:     Right lower leg: No edema.     Left lower leg: No edema.  Lymphadenopathy:     Cervical: No cervical adenopathy.  Skin:    General: Skin is warm and dry.  Neurological:     Mental Status: She is alert and oriented to person, place, and time. Mental status is at baseline.  Psychiatric:        Mood and Affect: Mood normal.        Behavior: Behavior normal.        Thought Content: Thought content normal.        Judgment: Judgment normal.   Patient states she is allergic to mold.   Results for orders placed or performed in visit on 04/11/20  POCT glycosylated hemoglobin (Hb A1C)  Result Value Ref Range   Hemoglobin A1C 6.3 (A) 4.0 - 5.6 %   HbA1c POC (<> result, manual entry)     HbA1c, POC (prediabetic range)     HbA1c, POC (controlled diabetic range)      Assessment & Plan     1. Type 2 diabetes mellitus without complication, without long-term current use of insulin (HCC) A1c under good control at 6.3.  Continue to work on diet and exercise. - POCT glycosylated hemoglobin (Hb A1C)  2. Aortic valve insufficiency, etiology of cardiac valve disease unspecified   3. Malignant neoplasm of sigmoid colon (HCC)   4. Class 1 obesity due to excess calories with serious comorbidity and body mass index (BMI) of 30.0 to 30.9 in adult   5. Pure hypercholesterolemia On Zetia in  statin intolerant patient.   No follow-ups on file.      I, Wilhemena Durie, MD, have reviewed all documentation for this visit. The documentation on 04/14/20 for the exam, diagnosis, procedures, and orders are all  accurate and complete.    Kinslei Labine Cranford Mon, MD  [facility] [phone] (phone) [phone] (fax)  [place]

## 2020-04-17 DIAGNOSIS — M62830 Muscle spasm of back: Secondary | ICD-10-CM | POA: Diagnosis not present

## 2020-04-17 DIAGNOSIS — M9902 Segmental and somatic dysfunction of thoracic region: Secondary | ICD-10-CM | POA: Diagnosis not present

## 2020-04-17 DIAGNOSIS — M5134 Other intervertebral disc degeneration, thoracic region: Secondary | ICD-10-CM | POA: Diagnosis not present

## 2020-04-17 DIAGNOSIS — M9901 Segmental and somatic dysfunction of cervical region: Secondary | ICD-10-CM | POA: Diagnosis not present

## 2020-04-25 ENCOUNTER — Other Ambulatory Visit: Payer: Self-pay | Admitting: Family Medicine

## 2020-04-25 DIAGNOSIS — E7849 Other hyperlipidemia: Secondary | ICD-10-CM

## 2020-05-28 DIAGNOSIS — M5134 Other intervertebral disc degeneration, thoracic region: Secondary | ICD-10-CM | POA: Diagnosis not present

## 2020-05-28 DIAGNOSIS — M9901 Segmental and somatic dysfunction of cervical region: Secondary | ICD-10-CM | POA: Diagnosis not present

## 2020-05-28 DIAGNOSIS — M9902 Segmental and somatic dysfunction of thoracic region: Secondary | ICD-10-CM | POA: Diagnosis not present

## 2020-05-28 DIAGNOSIS — M62830 Muscle spasm of back: Secondary | ICD-10-CM | POA: Diagnosis not present

## 2020-05-30 ENCOUNTER — Other Ambulatory Visit: Payer: Self-pay | Admitting: Family Medicine

## 2020-06-12 DIAGNOSIS — M5134 Other intervertebral disc degeneration, thoracic region: Secondary | ICD-10-CM | POA: Diagnosis not present

## 2020-06-12 DIAGNOSIS — M9901 Segmental and somatic dysfunction of cervical region: Secondary | ICD-10-CM | POA: Diagnosis not present

## 2020-06-12 DIAGNOSIS — M62830 Muscle spasm of back: Secondary | ICD-10-CM | POA: Diagnosis not present

## 2020-06-12 DIAGNOSIS — M9902 Segmental and somatic dysfunction of thoracic region: Secondary | ICD-10-CM | POA: Diagnosis not present

## 2020-06-26 DIAGNOSIS — M9902 Segmental and somatic dysfunction of thoracic region: Secondary | ICD-10-CM | POA: Diagnosis not present

## 2020-06-26 DIAGNOSIS — M62830 Muscle spasm of back: Secondary | ICD-10-CM | POA: Diagnosis not present

## 2020-06-26 DIAGNOSIS — M9901 Segmental and somatic dysfunction of cervical region: Secondary | ICD-10-CM | POA: Diagnosis not present

## 2020-06-26 DIAGNOSIS — M5134 Other intervertebral disc degeneration, thoracic region: Secondary | ICD-10-CM | POA: Diagnosis not present

## 2020-07-15 DIAGNOSIS — M9901 Segmental and somatic dysfunction of cervical region: Secondary | ICD-10-CM | POA: Diagnosis not present

## 2020-07-15 DIAGNOSIS — M9902 Segmental and somatic dysfunction of thoracic region: Secondary | ICD-10-CM | POA: Diagnosis not present

## 2020-07-15 DIAGNOSIS — M62830 Muscle spasm of back: Secondary | ICD-10-CM | POA: Diagnosis not present

## 2020-07-15 DIAGNOSIS — M5134 Other intervertebral disc degeneration, thoracic region: Secondary | ICD-10-CM | POA: Diagnosis not present

## 2020-08-07 DIAGNOSIS — M9901 Segmental and somatic dysfunction of cervical region: Secondary | ICD-10-CM | POA: Diagnosis not present

## 2020-08-07 DIAGNOSIS — M62830 Muscle spasm of back: Secondary | ICD-10-CM | POA: Diagnosis not present

## 2020-08-07 DIAGNOSIS — M5134 Other intervertebral disc degeneration, thoracic region: Secondary | ICD-10-CM | POA: Diagnosis not present

## 2020-08-07 DIAGNOSIS — M9902 Segmental and somatic dysfunction of thoracic region: Secondary | ICD-10-CM | POA: Diagnosis not present

## 2020-08-22 ENCOUNTER — Other Ambulatory Visit: Payer: Self-pay | Admitting: Family Medicine

## 2020-08-22 NOTE — Telephone Encounter (Signed)
Script sent to day.

## 2020-08-26 DIAGNOSIS — M62830 Muscle spasm of back: Secondary | ICD-10-CM | POA: Diagnosis not present

## 2020-08-26 DIAGNOSIS — M5134 Other intervertebral disc degeneration, thoracic region: Secondary | ICD-10-CM | POA: Diagnosis not present

## 2020-08-26 DIAGNOSIS — M9902 Segmental and somatic dysfunction of thoracic region: Secondary | ICD-10-CM | POA: Diagnosis not present

## 2020-08-26 DIAGNOSIS — M9901 Segmental and somatic dysfunction of cervical region: Secondary | ICD-10-CM | POA: Diagnosis not present

## 2020-09-05 ENCOUNTER — Encounter: Payer: Self-pay | Admitting: Family Medicine

## 2020-09-05 ENCOUNTER — Other Ambulatory Visit: Payer: Self-pay

## 2020-09-05 ENCOUNTER — Ambulatory Visit (INDEPENDENT_AMBULATORY_CARE_PROVIDER_SITE_OTHER): Payer: Medicare Other | Admitting: Family Medicine

## 2020-09-05 VITALS — BP 110/65 | HR 64 | Temp 97.5°F | Resp 16 | Ht 63.0 in | Wt 161.0 lb

## 2020-09-05 DIAGNOSIS — D649 Anemia, unspecified: Secondary | ICD-10-CM

## 2020-09-05 DIAGNOSIS — C20 Malignant neoplasm of rectum: Secondary | ICD-10-CM

## 2020-09-05 DIAGNOSIS — I351 Nonrheumatic aortic (valve) insufficiency: Secondary | ICD-10-CM

## 2020-09-05 DIAGNOSIS — I10 Essential (primary) hypertension: Secondary | ICD-10-CM

## 2020-09-05 DIAGNOSIS — Z0000 Encounter for general adult medical examination without abnormal findings: Secondary | ICD-10-CM

## 2020-09-05 DIAGNOSIS — E119 Type 2 diabetes mellitus without complications: Secondary | ICD-10-CM | POA: Diagnosis not present

## 2020-09-05 DIAGNOSIS — E7800 Pure hypercholesterolemia, unspecified: Secondary | ICD-10-CM | POA: Diagnosis not present

## 2020-09-05 DIAGNOSIS — N183 Chronic kidney disease, stage 3 unspecified: Secondary | ICD-10-CM | POA: Diagnosis not present

## 2020-09-05 NOTE — Progress Notes (Signed)
I,April Miller,acting as a scribe for Wilhemena Durie, MD.,have documented all relevant documentation on the behalf of Wilhemena Durie, MD,as directed by  Wilhemena Durie, MD while in the presence of Wilhemena Durie, MD.  Annual Wellness Visit     Patient: Tracy Silva, Female    DOB: 01-19-1941, 80 y.o.   MRN: [id number] Visit Date: 09/05/2020  Today's Provider: Wilhemena Durie, MD   Chief Complaint  Patient presents with   Medicare Wellness   Subjective    Tracy Silva is a 80 y.o. female who presents today for her Annual Wellness Visit.Annual Physical. She reports consuming a general diet. Home exercise routine includes walking. She generally feels well. She reports sleeping well. She does not have additional problems to discuss today.  She is married but her husband is an rapidly failing health.  He also has dementia which complicates things. HPI She has had intentional weight loss.  He has chronic back pain. He has a history of colon cancer but did not wish any follow-up.  She did well with surgical management.      Medications: Outpatient Medications Prior to Visit  Medication Sig   Acetaminophen (TYLENOL PO) Take by mouth daily as needed.   amLODipine-benazepril (LOTREL) 5-10 MG capsule TAKE 1 CAPSULE BY MOUTH EVERY DAY FOR HIGH BLOOD PRESSURE   ezetimibe (ZETIA) 10 MG tablet TAKE ONE TABLET BY MOUTH EVERY MORNING   hydrochlorothiazide (MICROZIDE) 12.5 MG capsule TAKE 1 CAPSULE BY MOUTH ONCE DAILY   levothyroxine (SYNTHROID) 75 MCG tablet TAKE ONE TABLET ON AN EMPTY STOMACH WITHA GLASS OF WATER AT LEAST 30 TO 60 MINUTES BEFORE BREAKFAST   loratadine (CLARITIN) 10 MG tablet Take 1 tablet (10 mg total) by mouth daily.   metoprolol succinate (TOPROL-XL) 50 MG 24 hr tablet TAKE 1 TABLET BY MOUTH DAILY WITH OR IMMEDIATELY FOLLOWING A MEAL   Multiple Vitamins-Calcium (ONE-A-DAY WOMENS PO) Take by mouth daily.   omeprazole (PRILOSEC OTC) 20 MG tablet  Take 20 mg by mouth daily as needed.   pioglitazone (ACTOS) 45 MG tablet Take 1 tablet (45 mg total) by mouth daily.   [DISCONTINUED] ferrous sulfate 325 (65 FE) MG tablet Take 325 mg by mouth daily with breakfast.   [DISCONTINUED] glipiZIDE (GLUCOTROL) 5 MG tablet TAKE ONE TABLET BY MOUTH EVERY DAY BEFORE BREAKFAST   [DISCONTINUED] sertraline (ZOLOFT) 50 MG tablet Take 1 tablet (50 mg total) by mouth daily.   No facility-administered medications prior to visit.    Allergies  Allergen Reactions   Codeine Other (See Comments)    Headaches Heart palpations   Other     Wool and cashmere    Statins     Muscle pain, joint pain   Adhesive [Tape] Rash    FOR  LONG PERIODS OF TIME FOR  LONG PERIODS OF TIME    Patient Care Team: Jerrol Banana., MD as PCP - General (Family Medicine) Bary Castilla, Forest Gleason, MD (General Surgery) Jerrol Banana., MD (Family Medicine)  Review of Systems  All other systems reviewed and are negative.       Objective    Vitals: BP 110/65 (BP Location: Right Arm, Patient Position: Sitting, Cuff Size: Large)   Pulse 64   Temp (!) 97.5 F (36.4 C) (Oral)   Resp 16   Ht 5\' 3"  (1.6 m)   Wt 161 lb (73 kg)   SpO2 99%   BMI 28.52 kg/m  BP Readings from  Last 3 Encounters:  09/05/20 110/65  04/11/20 (!) 129/51  12/12/19 119/70   Wt Readings from Last 3 Encounters:  09/05/20 161 lb (73 kg)  04/11/20 169 lb (76.7 kg)  12/12/19 174 lb (78.9 kg)      Physical Exam Vitals reviewed.  Constitutional:      Appearance: Normal appearance.  HENT:     Head: Normocephalic and atraumatic.  Cardiovascular:     Rate and Rhythm: Normal rate and regular rhythm.     Heart sounds: Murmur heard.  Systolic murmur is present with a grade of 2/6.  Pulmonary:     Effort: Pulmonary effort is normal.     Breath sounds: Normal breath sounds.  Abdominal:     General: Bowel sounds are normal.     Tenderness: There is no abdominal tenderness.   Musculoskeletal:     Right lower leg: No edema.     Left lower leg: No edema.     Comments: She has trace lower extremity edema along with some lymphedema.  Lymphadenopathy:     Cervical: No cervical adenopathy.  Skin:    General: Skin is warm and dry.  Neurological:     Mental Status: She is alert and oriented to person, place, and time. Mental status is at baseline.  Psychiatric:        Mood and Affect: Mood normal.        Behavior: Behavior normal.        Thought Content: Thought content normal.        Judgment: Judgment normal.     Most recent functional status assessment: In your present state of health, do you have any difficulty performing the following activities: 09/05/2020  Hearing? N  Vision? N  Difficulty concentrating or making decisions? N  Walking or climbing stairs? N  Dressing or bathing? N  Doing errands, shopping? N  Some recent data might be hidden   Most recent fall risk assessment: Fall Risk  09/05/2020  Falls in the past year? 0  Number falls in past yr: 0  Injury with Fall? 0  Risk for fall due to : No Fall Risks  Follow up Falls evaluation completed    Most recent depression screenings: PHQ 2/9 Scores 09/05/2020 04/11/2020  PHQ - 2 Score 0 0  PHQ- 9 Score 0 0   Most recent cognitive screening: No flowsheet data found. Most recent Audit-C alcohol use screening Alcohol Use Disorder Test (AUDIT) 09/05/2020  1. How often do you have a drink containing alcohol? 0  2. How many drinks containing alcohol do you have on a typical day when you are drinking? 0  3. How often do you have six or more drinks on one occasion? 0  AUDIT-C Score 0  Alcohol Brief Interventions/Follow-up -   A score of 3 or more in women, and 4 or more in men indicates increased risk for alcohol abuse, EXCEPT if all of the points are from question 1   No results found for any visits on 09/05/20.  Assessment & Plan     Annual wellness visit done today including the all of the  following: Reviewed patient's Family Medical History Reviewed and updated list of patient's medical providers Assessment of cognitive impairment was done Assessed patient's functional ability Established a written schedule for health screening [place] Completed and Reviewed  Exercise Activities and Dietary recommendations  Goals   None     Immunization History  Administered Date(s) Administered   Fluad Quad(high  Dose 65+) 11/07/2018, 11/13/2019   Influenza, High Dose Seasonal PF 09/27/2014, 11/01/2015, 11/25/2016, 11/03/2017   PFIZER(Purple Top)SARS-COV-2 Vaccination 12/12/2019   Pneumococcal Conjugate-13 06/05/2013   Pneumococcal Polysaccharide-23 12/23/2015   Td 12/01/2006    Health Maintenance  Topic Date Due   OPHTHALMOLOGY EXAM  Never done   Zoster Vaccines- Shingrix (1 of 2) Never done   DEXA SCAN  Never done   FOOT EXAM  10/01/2016   TETANUS/TDAP  11/30/2016   COVID-19 Vaccine (2 - Pfizer risk series) 01/02/2020   INFLUENZA VACCINE  08/26/2020   HEMOGLOBIN A1C  10/12/2020   PNA vac Low Risk Adult  Completed   HPV VACCINES  Aged Out   1. Medicare annual wellness visit, subsequent   2. Annual physical exam 38 patient declines breast exam pelvic exam rectal exam.  3. Type 2 diabetes mellitus without complication, without long-term current use of insulin (HCC)  - Hemoglobin A1c  4. Pure hypercholesterolemia  - Lipid panel - TSH - Comprehensive Metabolic Panel (CMET)  5. Essential (primary) hypertension  - CBC w/Diff/Platelet - Comprehensive Metabolic Panel (CMET)  6. Stage 3 chronic kidney disease, unspecified whether stage 3a or 3b CKD (HCC)  - Comprehensive Metabolic Panel (CMET)  7. Anemia, unspecified type  - CBC w/Diff/Platelet  8. Aortic valve insufficiency, etiology of cardiac valve disease unspecified   9. Rectal cancer [facility]) Patient does not wish further follow-up   Discussed health benefits of physical  activity, and encouraged her to engage in regular exercise appropriate for her age and condition.      No follow-ups on file.     I, Wilhemena Durie, MD, have reviewed all documentation for this visit. The documentation on 09/15/20 for the exam, diagnosis, procedures, and orders are all accurate and complete.    Maris Bena Cranford Mon, MD  [facility] [phone] (phone) [phone] (fax)  [place]

## 2020-09-09 DIAGNOSIS — I10 Essential (primary) hypertension: Secondary | ICD-10-CM | POA: Diagnosis not present

## 2020-09-09 DIAGNOSIS — E7800 Pure hypercholesterolemia, unspecified: Secondary | ICD-10-CM | POA: Diagnosis not present

## 2020-09-09 DIAGNOSIS — E119 Type 2 diabetes mellitus without complications: Secondary | ICD-10-CM | POA: Diagnosis not present

## 2020-09-09 DIAGNOSIS — D649 Anemia, unspecified: Secondary | ICD-10-CM | POA: Diagnosis not present

## 2020-09-09 DIAGNOSIS — N183 Chronic kidney disease, stage 3 unspecified: Secondary | ICD-10-CM | POA: Diagnosis not present

## 2020-09-10 LAB — CBC WITH DIFFERENTIAL/PLATELET
Basophils Absolute: 0 10*3/uL (ref 0.0–0.2)
Basos: 1 %
EOS (ABSOLUTE): 0.2 10*3/uL (ref 0.0–0.4)
Eos: 7 %
Hematocrit: 30.6 % — ABNORMAL LOW (ref 34.0–46.6)
Hemoglobin: 10.3 g/dL — ABNORMAL LOW (ref 11.1–15.9)
Immature Grans (Abs): 0 10*3/uL (ref 0.0–0.1)
Immature Granulocytes: 0 %
Lymphocytes Absolute: 1.1 10*3/uL (ref 0.7–3.1)
Lymphs: 37 %
MCH: 32.9 pg (ref 26.6–33.0)
MCHC: 33.7 g/dL (ref 31.5–35.7)
MCV: 98 fL — ABNORMAL HIGH (ref 79–97)
Monocytes Absolute: 0.4 10*3/uL (ref 0.1–0.9)
Monocytes: 13 %
Neutrophils Absolute: 1.3 10*3/uL — ABNORMAL LOW (ref 1.4–7.0)
Neutrophils: 42 %
Platelets: 197 10*3/uL (ref 150–450)
RBC: 3.13 x10E6/uL — ABNORMAL LOW (ref 3.77–5.28)
RDW: 13.7 % (ref 11.7–15.4)
WBC: 3.1 10*3/uL — ABNORMAL LOW (ref 3.4–10.8)

## 2020-09-10 LAB — COMPREHENSIVE METABOLIC PANEL
ALT: 8 IU/L (ref 0–32)
AST: 19 IU/L (ref 0–40)
Albumin/Globulin Ratio: 1.8 (ref 1.2–2.2)
Albumin: 4.2 g/dL (ref 3.6–4.6)
Alkaline Phosphatase: 64 IU/L (ref 44–121)
BUN/Creatinine Ratio: 20 (ref 12–28)
BUN: 41 mg/dL — ABNORMAL HIGH (ref 8–27)
Bilirubin Total: 0.3 mg/dL (ref 0.0–1.2)
CO2: 21 mmol/L (ref 20–29)
Calcium: 9.7 mg/dL (ref 8.7–10.3)
Chloride: 107 mmol/L — ABNORMAL HIGH (ref 96–106)
Creatinine, Ser: 2.04 mg/dL — ABNORMAL HIGH (ref 0.57–1.00)
Globulin, Total: 2.3 g/dL (ref 1.5–4.5)
Glucose: 108 mg/dL — ABNORMAL HIGH (ref 65–99)
Potassium: 4.1 mmol/L (ref 3.5–5.2)
Sodium: 142 mmol/L (ref 134–144)
Total Protein: 6.5 g/dL (ref 6.0–8.5)
eGFR: 24 mL/min/{1.73_m2} — ABNORMAL LOW (ref >59)

## 2020-09-10 LAB — TSH: TSH: 6.48 u[IU]/mL — ABNORMAL HIGH (ref 0.450–4.500)

## 2020-09-10 LAB — HEMOGLOBIN A1C
Est. average glucose Bld gHb Est-mCnc: 126 mg/dL
Hgb A1c MFr Bld: 6 % — ABNORMAL HIGH (ref 4.8–5.6)

## 2020-09-10 LAB — LIPID PANEL
Chol/HDL Ratio: 4.3 ratio (ref 0.0–4.4)
Cholesterol, Total: 239 mg/dL — ABNORMAL HIGH (ref 100–199)
HDL: 55 mg/dL (ref >39)
LDL Chol Calc (NIH): 149 mg/dL — ABNORMAL HIGH (ref 0–99)
Triglycerides: 193 mg/dL — ABNORMAL HIGH (ref 0–149)
VLDL Cholesterol Cal: 35 mg/dL (ref 5–40)

## 2020-09-16 DIAGNOSIS — M9902 Segmental and somatic dysfunction of thoracic region: Secondary | ICD-10-CM | POA: Diagnosis not present

## 2020-09-16 DIAGNOSIS — M9901 Segmental and somatic dysfunction of cervical region: Secondary | ICD-10-CM | POA: Diagnosis not present

## 2020-09-16 DIAGNOSIS — M5134 Other intervertebral disc degeneration, thoracic region: Secondary | ICD-10-CM | POA: Diagnosis not present

## 2020-09-16 DIAGNOSIS — M62830 Muscle spasm of back: Secondary | ICD-10-CM | POA: Diagnosis not present

## 2020-10-03 ENCOUNTER — Other Ambulatory Visit: Payer: Self-pay | Admitting: Family Medicine

## 2020-10-03 DIAGNOSIS — E119 Type 2 diabetes mellitus without complications: Secondary | ICD-10-CM

## 2020-10-14 DIAGNOSIS — M9901 Segmental and somatic dysfunction of cervical region: Secondary | ICD-10-CM | POA: Diagnosis not present

## 2020-10-14 DIAGNOSIS — M5134 Other intervertebral disc degeneration, thoracic region: Secondary | ICD-10-CM | POA: Diagnosis not present

## 2020-10-14 DIAGNOSIS — M9902 Segmental and somatic dysfunction of thoracic region: Secondary | ICD-10-CM | POA: Diagnosis not present

## 2020-10-14 DIAGNOSIS — M62830 Muscle spasm of back: Secondary | ICD-10-CM | POA: Diagnosis not present

## 2020-11-04 DIAGNOSIS — M62830 Muscle spasm of back: Secondary | ICD-10-CM | POA: Diagnosis not present

## 2020-11-04 DIAGNOSIS — M9903 Segmental and somatic dysfunction of lumbar region: Secondary | ICD-10-CM | POA: Diagnosis not present

## 2020-11-04 DIAGNOSIS — M9905 Segmental and somatic dysfunction of pelvic region: Secondary | ICD-10-CM | POA: Diagnosis not present

## 2020-11-04 DIAGNOSIS — M955 Acquired deformity of pelvis: Secondary | ICD-10-CM | POA: Diagnosis not present

## 2020-11-20 ENCOUNTER — Other Ambulatory Visit: Payer: Self-pay | Admitting: Family Medicine

## 2020-11-25 DIAGNOSIS — M9903 Segmental and somatic dysfunction of lumbar region: Secondary | ICD-10-CM | POA: Diagnosis not present

## 2020-11-25 DIAGNOSIS — M62830 Muscle spasm of back: Secondary | ICD-10-CM | POA: Diagnosis not present

## 2020-11-25 DIAGNOSIS — M955 Acquired deformity of pelvis: Secondary | ICD-10-CM | POA: Diagnosis not present

## 2020-11-25 DIAGNOSIS — M9905 Segmental and somatic dysfunction of pelvic region: Secondary | ICD-10-CM | POA: Diagnosis not present

## 2020-12-05 ENCOUNTER — Other Ambulatory Visit: Payer: Self-pay | Admitting: Family Medicine

## 2020-12-05 NOTE — Telephone Encounter (Signed)
Requested Prescriptions  Pending Prescriptions Disp Refills  . levothyroxine (SYNTHROID) 75 MCG tablet [Pharmacy Med Name: LEVOTHYROXINE SODIUM 75 MCG TAB] 30 tablet 9    Sig: TAKE ONE TABLET ON AN EMPTY STOMACH WITHA GLASS OF WATER AT LEAST 30 TO 53 MINUTES BEFORE BREAKFAST     Endocrinology:  Hypothyroid Agents Failed - 12/05/2020  3:05 PM      Failed - TSH needs to be rechecked within 3 months after an abnormal result. Refill until TSH is due.      Failed - TSH in normal range and within 360 days    TSH  Date Value Ref Range Status  09/09/2020 6.480 (H) 0.450 - 4.500 uIU/mL Final         Passed - Valid encounter within last 12 months    Recent Outpatient Visits          3 months ago Medicare annual wellness visit, subsequent   [facility] Jerrol Banana., MD   7 months ago Type 2 diabetes mellitus without complication, without long-term current use of insulin [facility])   [facility] Jerrol Banana., MD   11 months ago Type 2 diabetes mellitus without complication, without long-term current use of insulin [facility])   [facility] Jerrol Banana., MD   1 year ago Type 2 diabetes mellitus without complication, without long-term current use of insulin [facility])   [facility] Jerrol Banana., MD   2 years ago Essential (primary) hypertension   [facility] Jerrol Banana., MD      Future Appointments            In 2 months Jerrol Banana., MD [facility]

## 2020-12-11 ENCOUNTER — Other Ambulatory Visit: Payer: Self-pay

## 2020-12-11 ENCOUNTER — Ambulatory Visit (INDEPENDENT_AMBULATORY_CARE_PROVIDER_SITE_OTHER): Payer: Medicare Other

## 2020-12-11 DIAGNOSIS — M9903 Segmental and somatic dysfunction of lumbar region: Secondary | ICD-10-CM | POA: Diagnosis not present

## 2020-12-11 DIAGNOSIS — Z23 Encounter for immunization: Secondary | ICD-10-CM

## 2020-12-11 DIAGNOSIS — M955 Acquired deformity of pelvis: Secondary | ICD-10-CM | POA: Diagnosis not present

## 2020-12-11 DIAGNOSIS — M9905 Segmental and somatic dysfunction of pelvic region: Secondary | ICD-10-CM | POA: Diagnosis not present

## 2020-12-11 DIAGNOSIS — M62830 Muscle spasm of back: Secondary | ICD-10-CM | POA: Diagnosis not present

## 2020-12-16 DIAGNOSIS — M9903 Segmental and somatic dysfunction of lumbar region: Secondary | ICD-10-CM | POA: Diagnosis not present

## 2020-12-16 DIAGNOSIS — M9905 Segmental and somatic dysfunction of pelvic region: Secondary | ICD-10-CM | POA: Diagnosis not present

## 2020-12-16 DIAGNOSIS — M62830 Muscle spasm of back: Secondary | ICD-10-CM | POA: Diagnosis not present

## 2020-12-16 DIAGNOSIS — M955 Acquired deformity of pelvis: Secondary | ICD-10-CM | POA: Diagnosis not present

## 2020-12-30 DIAGNOSIS — M9903 Segmental and somatic dysfunction of lumbar region: Secondary | ICD-10-CM | POA: Diagnosis not present

## 2020-12-30 DIAGNOSIS — M955 Acquired deformity of pelvis: Secondary | ICD-10-CM | POA: Diagnosis not present

## 2020-12-30 DIAGNOSIS — M9905 Segmental and somatic dysfunction of pelvic region: Secondary | ICD-10-CM | POA: Diagnosis not present

## 2020-12-30 DIAGNOSIS — M62830 Muscle spasm of back: Secondary | ICD-10-CM | POA: Diagnosis not present

## 2020-12-31 ENCOUNTER — Telehealth: Payer: Self-pay | Admitting: *Deleted

## 2020-12-31 NOTE — Chronic Care Management (AMB) (Signed)
  Chronic Care Management   Outreach Note  12/31/2020 Name: COLE KLUGH MRN: [id number] DOB: Dec 28, 1940  EMOGENE MURATALLA is a 80 y.o. year old female who is a primary care patient of Jerrol Banana., MD. I reached out to Donata Duff by phone today in response to a referral sent by Ms. Coralie Common Quade's primary care provider.  An unsuccessful telephone outreach was attempted today. The patient was referred to the case management team for assistance with care management and care coordination.   Follow Up Plan: A HIPAA compliant phone message was left for the patient providing contact information and requesting a return call.  If patient returns call to provider office, please advise to call Embedded Care Management Care Guide Venera Privott at [place], [place] Management  Direct Dial: [phone]

## 2021-01-21 DIAGNOSIS — M955 Acquired deformity of pelvis: Secondary | ICD-10-CM | POA: Diagnosis not present

## 2021-01-21 DIAGNOSIS — M9903 Segmental and somatic dysfunction of lumbar region: Secondary | ICD-10-CM | POA: Diagnosis not present

## 2021-01-21 DIAGNOSIS — M62830 Muscle spasm of back: Secondary | ICD-10-CM | POA: Diagnosis not present

## 2021-01-21 DIAGNOSIS — M9905 Segmental and somatic dysfunction of pelvic region: Secondary | ICD-10-CM | POA: Diagnosis not present

## 2021-01-28 ENCOUNTER — Other Ambulatory Visit: Payer: Self-pay | Admitting: Family Medicine

## 2021-01-28 DIAGNOSIS — I10 Essential (primary) hypertension: Secondary | ICD-10-CM

## 2021-02-06 ENCOUNTER — Ambulatory Visit: Payer: Self-pay | Admitting: *Deleted

## 2021-02-06 NOTE — Chronic Care Management (AMB) (Signed)
Per recent changes referral needed for Pharm D - routed to pcp

## 2021-02-06 NOTE — Telephone Encounter (Signed)
°  Chief Complaint: "Clogged nose" Symptoms: "Stuffy nose at night mostly" Frequency: Onset Saturday Pertinent Negatives: Patient denies sinus pain, fever, pressure, cough, sore throat,,SOB. "Only stuffy nose" Disposition: [] ED /[] Urgent Care (no appt availability in office) / [] Appointment(In office/virtual)/ []  [facility] [facility]/ [x] Home Care/ [] Refused Recommended Disposition /[] [place] [facility]/ []  Follow-up with PCP Additional Notes: Pt requesting advise on OTCmeds to use for "Stuffy nose, mostly at  night" no other symptoms."  Seems reluctant to continue triage questions.  Home care advise given. Would like recommendations from MD on any OTC meds appropriate. Assured pt NT would route to practice for PCPs review and final disposition.        Reason for Disposition  Common cold with no complications  Answer Assessment - Initial Assessment Questions 1. ONSET: "When did the nasal discharge start?"      "Stuffiness" 2. AMOUNT: "How much discharge is there?"      Minimal now. Now stuffy 3. COUGH: "Do you have a cough?" If yes, ask: "Describe the color of your sputum" (clear, white, yellow, green)     No 4. RESPIRATORY DISTRESS: "Describe your breathing."      WNL 5. FEVER: "Do you have a fever?" If Yes, ask: "What is your temperature, how was it measured, and when did it start?"     no 6. SEVERITY: "Overall, how bad are you feeling right now?" (e.g., doesn't interfere with normal activities, staying home from school/work, staying in bed)      Fair 7. OTHER SYMPTOMS: "Do you have any other symptoms?" (e.g., sore throat, earache, wheezing, vomiting)    HAd "Scratchy throat sat. Not presently.  Protocols used: Common Cold-A-AH

## 2021-02-10 DIAGNOSIS — M9903 Segmental and somatic dysfunction of lumbar region: Secondary | ICD-10-CM | POA: Diagnosis not present

## 2021-02-10 DIAGNOSIS — M9905 Segmental and somatic dysfunction of pelvic region: Secondary | ICD-10-CM | POA: Diagnosis not present

## 2021-02-10 DIAGNOSIS — M62830 Muscle spasm of back: Secondary | ICD-10-CM | POA: Diagnosis not present

## 2021-02-10 DIAGNOSIS — M955 Acquired deformity of pelvis: Secondary | ICD-10-CM | POA: Diagnosis not present

## 2021-02-17 ENCOUNTER — Encounter: Payer: Self-pay | Admitting: Family Medicine

## 2021-02-17 ENCOUNTER — Other Ambulatory Visit: Payer: Self-pay

## 2021-02-17 ENCOUNTER — Ambulatory Visit (INDEPENDENT_AMBULATORY_CARE_PROVIDER_SITE_OTHER): Payer: Medicare Other | Admitting: Family Medicine

## 2021-02-17 VITALS — BP 117/53 | HR 69 | Temp 97.6°F | Ht 63.5 in | Wt 159.2 lb

## 2021-02-17 DIAGNOSIS — J029 Acute pharyngitis, unspecified: Secondary | ICD-10-CM

## 2021-02-17 DIAGNOSIS — J018 Other acute sinusitis: Secondary | ICD-10-CM | POA: Diagnosis not present

## 2021-02-17 LAB — POCT RAPID STREP A (OFFICE): Rapid Strep A Screen: NEGATIVE

## 2021-02-17 MED ORDER — AZITHROMYCIN 250 MG PO TABS: ORAL_TABLET | ORAL | 0 refills | Status: AC

## 2021-02-17 MED ORDER — MONTELUKAST SODIUM 10 MG PO TABS: 10.0000 mg | ORAL_TABLET | Freq: Every day | ORAL | 0 refills | Status: DC

## 2021-02-17 NOTE — Progress Notes (Signed)
I,Sha'taria Tyson,acting as a Education administrator for Lelon Huh, MD.,have documented all relevant documentation on the behalf of Lelon Huh, MD,as directed by  Lelon Huh, MD while in the presence of Lelon Huh, MD.  Established patient visit   Patient: Tracy Silva   DOB: 1941/01/09   81 y.o. Female  MRN: [id number] Visit Date: 02/17/2021  Today's healthcare provider: Lelon Huh, MD   Chief Complaint  Patient presents with   Sore Throat   Subjective    Sore Throat    3 weeks ago started having a runny nose. Following week called up here and was advised to take coricidin hp and has now been taking it 2 weeks. Wants to make sure post nasal drip is not causing her to have strep throat considering she has hx of them and this has been going on for 3 weeks now. Would say that today it has gotten a little better. Last took coricidin last night at 11 pm.   Medications: Outpatient Medications Prior to Visit  Medication Sig   Acetaminophen (TYLENOL PO) Take by mouth daily as needed.   amLODipine-benazepril (LOTREL) 5-10 MG capsule TAKE 1 CAPSULE BY MOUTH EVERY DAY FOR HIGH BLOOD PRESSURE   ezetimibe (ZETIA) 10 MG tablet TAKE ONE TABLET BY MOUTH EVERY MORNING   hydrochlorothiazide (MICROZIDE) 12.5 MG capsule TAKE 1 CAPSULE BY MOUTH ONCE DAILY   levothyroxine (SYNTHROID) 75 MCG tablet TAKE ONE TABLET ON AN EMPTY STOMACH WITHA GLASS OF WATER AT LEAST 30 TO 60 MINUTES BEFORE BREAKFAST   loratadine (CLARITIN) 10 MG tablet Take 1 tablet (10 mg total) by mouth daily.   metoprolol succinate (TOPROL-XL) 50 MG 24 hr tablet TAKE 1 TABLET BY MOUTH DAILY WITH OR IMMEDIATELY FOLLOWING A MEAL   Multiple Vitamins-Calcium (ONE-A-DAY WOMENS PO) Take by mouth daily.   omeprazole (PRILOSEC OTC) 20 MG tablet Take 20 mg by mouth daily as needed.   pioglitazone (ACTOS) 45 MG tablet TAKE ONE TABLET BY MOUTH EVERY DAY   No facility-administered medications prior to visit.         Objective    BP  (!) 117/53 (BP Location: Right Arm, Patient Position: Sitting, Cuff Size: Normal)    Pulse 69    Temp 97.6 F (36.4 C) (Oral)    Ht 5' 3.5" (1.613 m)    Wt 159 lb 3.2 oz (72.2 kg)    SpO2 99%    BMI 27.76 kg/m  {Show previous vital signs (optional):23777}  Physical Exam   General Appearance:     Well developed, well nourished female, alert, cooperative, in no acute distress  HENT:   bilateral TM normal without fluid or infection, neck without nodes, pharynx erythematous without exudate, sinuses nontender, and nasal mucosa congested  Eyes:    PERRL, conjunctiva/corneas clear, EOM's intact       Lungs:     Clear to auscultation bilaterally, respirations unlabored  Heart:    Normal heart rate. Normal rhythm.   Neurologic:   Awake, alert, oriented x 3. No apparent focal neurological           defect.       Rapid strep test: Negative.   Assessment & Plan     1. Sore throat   2. Acute non-recurrent sinusitis of other sinus URI versus allergies.  - montelukast (SINGULAIR) 10 MG tablet; Take 1 tablet (10 mg total) by mouth at bedtime.  Dispense: 30 tablet; Refill: 0 - azithromycin (ZITHROMAX) 250 MG tablet; Take 2 tablets  on day 1, then 1 tablet daily on days 2 through 5  Dispense: 6 tablet; Refill: 0        The entirety of the information documented in the History of Present Illness, Review of Systems and Physical Exam were personally obtained by me. Portions of this information were initially documented by the CMA and reviewed by me for thoroughness and accuracy.     Lelon Huh, MD  [facility] [phone] (phone) [phone] (fax)  [place]

## 2021-02-25 ENCOUNTER — Ambulatory Visit: Payer: Medicare Other | Admitting: Family Medicine

## 2021-03-04 DIAGNOSIS — M955 Acquired deformity of pelvis: Secondary | ICD-10-CM | POA: Diagnosis not present

## 2021-03-04 DIAGNOSIS — M9903 Segmental and somatic dysfunction of lumbar region: Secondary | ICD-10-CM | POA: Diagnosis not present

## 2021-03-04 DIAGNOSIS — M9905 Segmental and somatic dysfunction of pelvic region: Secondary | ICD-10-CM | POA: Diagnosis not present

## 2021-03-04 DIAGNOSIS — M62830 Muscle spasm of back: Secondary | ICD-10-CM | POA: Diagnosis not present

## 2021-03-18 DIAGNOSIS — M955 Acquired deformity of pelvis: Secondary | ICD-10-CM | POA: Diagnosis not present

## 2021-03-18 DIAGNOSIS — M9905 Segmental and somatic dysfunction of pelvic region: Secondary | ICD-10-CM | POA: Diagnosis not present

## 2021-03-18 DIAGNOSIS — M62830 Muscle spasm of back: Secondary | ICD-10-CM | POA: Diagnosis not present

## 2021-03-18 DIAGNOSIS — M9903 Segmental and somatic dysfunction of lumbar region: Secondary | ICD-10-CM | POA: Diagnosis not present

## 2021-04-08 DIAGNOSIS — M9905 Segmental and somatic dysfunction of pelvic region: Secondary | ICD-10-CM | POA: Diagnosis not present

## 2021-04-08 DIAGNOSIS — M9903 Segmental and somatic dysfunction of lumbar region: Secondary | ICD-10-CM | POA: Diagnosis not present

## 2021-04-08 DIAGNOSIS — M62830 Muscle spasm of back: Secondary | ICD-10-CM | POA: Diagnosis not present

## 2021-04-08 DIAGNOSIS — M955 Acquired deformity of pelvis: Secondary | ICD-10-CM | POA: Diagnosis not present

## 2021-04-09 ENCOUNTER — Encounter: Payer: Self-pay | Admitting: Family Medicine

## 2021-04-09 ENCOUNTER — Other Ambulatory Visit: Payer: Self-pay

## 2021-04-09 ENCOUNTER — Ambulatory Visit (INDEPENDENT_AMBULATORY_CARE_PROVIDER_SITE_OTHER): Payer: Medicare Other | Admitting: Family Medicine

## 2021-04-09 VITALS — BP 136/70 | HR 65 | Temp 97.6°F | Resp 16 | Ht 63.0 in | Wt 156.0 lb

## 2021-04-09 DIAGNOSIS — E119 Type 2 diabetes mellitus without complications: Secondary | ICD-10-CM | POA: Diagnosis not present

## 2021-04-09 DIAGNOSIS — C20 Malignant neoplasm of rectum: Secondary | ICD-10-CM | POA: Diagnosis not present

## 2021-04-09 DIAGNOSIS — N183 Chronic kidney disease, stage 3 unspecified: Secondary | ICD-10-CM

## 2021-04-09 DIAGNOSIS — E7800 Pure hypercholesterolemia, unspecified: Secondary | ICD-10-CM

## 2021-04-09 DIAGNOSIS — I10 Essential (primary) hypertension: Secondary | ICD-10-CM

## 2021-04-09 NOTE — Progress Notes (Signed)
?  ? ? ?Established patient visit ? ?I,April Miller,acting as a scribe for Wilhemena Durie, MD.,have documented all relevant documentation on the behalf of Wilhemena Durie, MD,as directed by  Wilhemena Durie, MD while in the presence of Wilhemena Durie, MD. ? ? ?Patient: Tracy Silva   DOB: 02-08-1940   81 y.o. Female  MRN: [id number] ?Visit Date: 04/09/2021 ? ?Today's healthcare provider: Wilhemena Durie, MD  ? ?Chief Complaint  ?Patient presents with  ? Follow-up  ? Diabetes  ? Hypertension  ? ?Subjective  ?  ?HPI  ?Patient comes in today for follow-up.  She is doing fairly well.  She has lost a good bit of weight intentionally with sugar busters diet ?Diabetes Mellitus Type II, follow-up ? ?Lab Results  ?Component Value Date  ? HGBA1C 6.0 (H) 09/09/2020  ? HGBA1C 6.3 (A) 04/11/2020  ? HGBA1C 6.4 (A) 12/12/2019  ? ?Last seen for diabetes 7 months ago.  ?Management since then includes continuing the same treatment. ?She reports good compliance with treatment. ?She is not having side effects. none ? ?Home blood sugar records: fasting range:   ? ?Episodes of hypoglycemia? No none ?  ?Current insulin regiment: n/a ?Most Recent Eye Exam:  ? ?--------------------------------------------------------------------------------------------------- ?Hypertension, follow-up ? ?BP Readings from Last 3 Encounters:  ?04/09/21 136/70  ?02/17/21 (!) 117/53  ?09/05/20 110/65  ? Wt Readings from Last 3 Encounters:  ?04/09/21 156 lb (70.8 kg)  ?02/17/21 159 lb 3.2 oz (72.2 kg)  ?09/05/20 161 lb (73 kg)  ?  ? ?She was last seen for hypertension 7 months ago.  ?BP at that visit was 110/65. ?Management since that visit includes; on amlodipine-benazepril. HCTZ, and metoprolol. ?She reports good compliance with treatment. ?She is not having side effects. none ?She is exercising. ?She is adherent to low salt diet.   ?Outside blood pressures are not checking. ? ?She does not smoke. ? ?Use of agents associated with  hypertension: none.  ? ?--------------------------------------------------------------------------------------------------- ?Lipid/Cholesterol, follow-up ? ?Last Lipid Panel: ?Lab Results  ?Component Value Date  ? CHOL 239 (H) 09/09/2020  ? LDLCALC 149 (H) 09/09/2020  ? HDL 55 09/09/2020  ? TRIG 193 (H) 09/09/2020  ? ? ?She was last seen for this 7 months ago.  ?Management since that visit includes; labs checked showing-stable. ? ?She reports good compliance with treatment. ?She is not having side effects. none ? ?She is following a Regular diet. ?Current exercise: walking ? ?Last metabolic panel ?Lab Results  ?Component Value Date  ? GLUCOSE 108 (H) 09/09/2020  ? NA 142 09/09/2020  ? K 4.1 09/09/2020  ? BUN 41 (H) 09/09/2020  ? CREATININE 2.04 (H) 09/09/2020  ? EGFR 24 (L) 09/09/2020  ? GFRNONAA 21 (L) 08/17/2019  ? CALCIUM 9.7 09/09/2020  ? AST 19 09/09/2020  ? ALT 8 09/09/2020  ? ?The ASCVD Risk score (Arnett DK, et al., 2019) failed to calculate for the following reasons: ?  The 2019 ASCVD risk score is only valid for ages 4 to 29 ? ?--------------------------------------------------------------------------------------------------- ? ? ?Medications: ?Outpatient Medications Prior to Visit  ?Medication Sig  ? Acetaminophen (TYLENOL PO) Take by mouth daily as needed.  ? amLODipine-benazepril (LOTREL) 5-10 MG capsule TAKE 1 CAPSULE BY MOUTH EVERY DAY FOR HIGH BLOOD PRESSURE  ? ezetimibe (ZETIA) 10 MG tablet TAKE ONE TABLET BY MOUTH EVERY MORNING  ? hydrochlorothiazide (MICROZIDE) 12.5 MG capsule TAKE 1 CAPSULE BY MOUTH ONCE DAILY  ? levothyroxine (SYNTHROID) 75 MCG tablet TAKE ONE TABLET ON AN  EMPTY STOMACH WITHA GLASS OF WATER AT LEAST 30 TO 60 MINUTES BEFORE BREAKFAST  ? loratadine (CLARITIN) 10 MG tablet Take 1 tablet (10 mg total) by mouth daily.  ? metoprolol succinate (TOPROL-XL) 50 MG 24 hr tablet TAKE 1 TABLET BY MOUTH DAILY WITH OR IMMEDIATELY FOLLOWING A MEAL  ? montelukast (SINGULAIR) 10 MG tablet Take 1  tablet (10 mg total) by mouth at bedtime.  ? Multiple Vitamins-Calcium (ONE-A-DAY WOMENS PO) Take by mouth daily.  ? omeprazole (PRILOSEC OTC) 20 MG tablet Take 20 mg by mouth daily as needed.  ? pioglitazone (ACTOS) 45 MG tablet TAKE ONE TABLET BY MOUTH EVERY DAY  ? ?No facility-administered medications prior to visit.  ? ? ?Review of Systems  ?Constitutional:  Negative for appetite change, chills, fatigue and fever.  ?Respiratory:  Negative for chest tightness and shortness of breath.   ?Cardiovascular:  Negative for chest pain and palpitations.  ?Gastrointestinal:  Negative for abdominal pain, nausea and vomiting.  ?Neurological:  Negative for dizziness and weakness.  ? ?Last hemoglobin A1c ?Lab Results  ?Component Value Date  ? HGBA1C 6.0 (H) 09/09/2020  ? ?  ?  Objective  ?  ?BP 136/70 (BP Location: Right Arm, Patient Position: Sitting, Cuff Size: Normal)   Pulse 65   Temp 97.6 ?F (36.4 ?C) (Temporal)   Resp 16   Ht _0  (1.6 m)   Wt 156 lb (70.8 kg)   SpO2 95%   BMI 27.63 kg/m?  ?BP Readings from Last 3 Encounters:  ?04/09/21 136/70  ?02/17/21 (!) 117/53  ?09/05/20 110/65  ? ?Wt Readings from Last 3 Encounters:  ?04/09/21 156 lb (70.8 kg)  ?02/17/21 159 lb 3.2 oz (72.2 kg)  ?09/05/20 161 lb (73 kg)  ? ?  ? ?Physical Exam ?Vitals reviewed.  ?Constitutional:   ?   Appearance: Normal appearance.  ?HENT:  ?   Head: Normocephalic and atraumatic.  ?Cardiovascular:  ?   Rate and Rhythm: Normal rate and regular rhythm.  ?   Heart sounds: Murmur heard.  ?Systolic murmur is present with a grade of 2/6.  ?Pulmonary:  ?   Effort: Pulmonary effort is normal.  ?   Breath sounds: Normal breath sounds.  ?Abdominal:  ?   General: Bowel sounds are normal.  ?   Tenderness: There is no abdominal tenderness.  ?Musculoskeletal:  ?   Right lower leg: No edema.  ?   Left lower leg: No edema.  ?Lymphadenopathy:  ?   Cervical: No cervical adenopathy.  ?Skin: ?   General: Skin is warm and dry.  ?Neurological:  ?   Mental Status:  She is alert and oriented to person, place, and time. Mental status is at baseline.  ?Psychiatric:     ?   Mood and Affect: Mood normal.     ?   Behavior: Behavior normal.     ?   Thought Content: Thought content normal.     ?   Judgment: Judgment normal.  ?  ? ? ?No results found for any visits on 04/09/21. ? Assessment & Plan  ?  ? ?1. Type 2 diabetes mellitus without complication, without long-term current use of insulin ([place]) ?A1c is 5.8. ?Good control on active ? ?2. Essential (primary) hypertension ?Contolled. ? ?3. Pure hypercholesterolemia ? ? ?4. Stage 3 chronic kidney disease, unspecified whether stage 3a or 3b CKD ([place]) ? ? ?5. H/o Rectal cancer ([place]) ?Colectomy 08/21/16 ? ? ?No follow-ups on file.  ?   ? ?I,  Shanitra Phillippi Cranford Mon, MD, have reviewed all documentation for this visit. The documentation on 04/12/21 for the exam, diagnosis, procedures, and orders are all accurate and complete. ? ? ? ?Verlinda Slotnick Cranford Mon, MD  ?[facility] ?[phone] (phone) ?[phone] (fax) ? ?[facility] ?

## 2021-04-26 ENCOUNTER — Other Ambulatory Visit: Payer: Self-pay | Admitting: Family Medicine

## 2021-04-26 DIAGNOSIS — E7849 Other hyperlipidemia: Secondary | ICD-10-CM

## 2021-04-28 NOTE — Telephone Encounter (Signed)
Requested Prescriptions  ?Pending Prescriptions Disp Refills  ?? amLODipine-benazepril (LOTREL) 5-10 MG capsule [Pharmacy Med Name: AMLODIPINE-BENAZEPRIL 5-10 MG CAP] 90 capsule 1  ?  Sig: TAKE ONE CAPSULE EVERYDAY FOR BLOOD PRESSURE  ?  ? Cardiovascular: CCB + ACEI Combos Failed - 04/26/2021 12:35 PM  ?  ?  Failed - Cr in normal range and within 180 days  ?  Creat  ?Date Value Ref Range Status  ?11/30/2016 1.62 (H) 0.60 - 0.93 mg/dL Final  ?  Comment:  ?  For patients >27 years of age, the reference limit ?for Creatinine is approximately 13% higher for people ?identified as African-American. ?. ?  ? ?Creatinine, Ser  ?Date Value Ref Range Status  ?09/09/2020 2.04 (H) 0.57 - 1.00 mg/dL Final  ?   ?  ?  Failed - K in normal range and within 180 days  ?  Potassium  ?Date Value Ref Range Status  ?09/09/2020 4.1 3.5 - 5.2 mmol/L Final  ?07/01/2012 4.1 3.5 - 5.1 mmol/L Final  ?   ?  ?  Failed - Na in normal range and within 180 days  ?  Sodium  ?Date Value Ref Range Status  ?09/09/2020 142 134 - 144 mmol/L Final  ?07/01/2012 137 136 - 145 mmol/L Final  ?   ?  ?  Failed - eGFR is 30 or above and within 180 days  ?  GFR, Est African American  ?Date Value Ref Range Status  ?11/30/2016 35 (L) > OR = 60 mL/min/1.70m Final  ? ?GFR calc Af Amer  ?Date Value Ref Range Status  ?08/17/2019 24 (L) >59 mL/min/1.73 Final  ?  Comment:  ?  **[facility] currently reports eGFR in compliance with the current** ?  recommendations of the [employer] [facility] will ?  update reporting as new guidelines are published from the NKF-ASN ?  Task force. ?  ? ?GFR, Est Non African American  ?Date Value Ref Range Status  ?11/30/2016 30 (L) > OR = 60 mL/min/1.734mFinal  ? ?GFR calc non Af Amer  ?Date Value Ref Range Status  ?08/17/2019 21 (L) >59 mL/min/1.73 Final  ? ?eGFR  ?Date Value Ref Range Status  ?09/09/2020 24 (L) >59 mL/min/1.73 Final  ?   ?  ?  Passed - Patient is not pregnant  ?  ?  Passed - Last BP in normal range  ?  BP  Readings from Last 1 Encounters:  ?04/09/21 136/70  ?   ?  ?  Passed - Valid encounter within last 6 months  ?  Recent Outpatient Visits   ?      ? 2 weeks ago Type 2 diabetes mellitus without complication, without long-term current use of insulin ([place] ? [facility]  ? 2 months ago Sore [facility]  ? [facility]  ? 7 months ago Medicare annual wellness visit, subsequent  ? [facility]  ? 1 year ago Type 2 diabetes mellitus without complication, [place] long-term [place] use of insulin ([place] ? [facility]  ? 1 year ago Type 2 diabetes mellitus without complication, without long-term current use of insulin ([place] ? [facility]  ?  ?  ?Future Appointments   ?        ? In 4 months [facility]  ?  ? ?  ?  ?  ??  ezetimibe (ZETIA) 10 MG tablet [Pharmacy Med Name: EZETIMIBE 10 MG TAB] 90 tablet 3  ?  Sig: TAKE 1 TABLET BY MOUTH EVERY MORNING  ?  ? Cardiovascular:  Antilipid - Sterol Transport Inhibitors Failed - 04/26/2021 12:35 PM  ?  ?  Failed - Lipid Panel in normal range within the last 12 months  ?  Cholesterol, Total  ?Date Value Ref Range Status  ?09/09/2020 239 (H) 100 - 199 mg/dL Final  ? ?LDL Chol Calc (NIH)  ?Date Value Ref Range Status  ?09/09/2020 149 (H) 0 - 99 mg/dL Final  ? ?HDL  ?Date Value Ref Range Status  ?09/09/2020 55 >39 mg/dL Final  ? ?Triglycerides  ?Date Value Ref Range Status  ?09/09/2020 193 (H) 0 - 149 mg/dL Final  ? ?  ?  ?  Passed - AST in normal range and within 360 days  ?  AST  ?Date Value Ref Range Status  ?09/09/2020 19 0 - 40 IU/L Final  ?   ?  ?  Passed - ALT in normal range and within 360 days  ?  ALT  ?Date Value Ref Range Status  ?09/09/2020 8 0 - 32 IU/L Final  ?   ?  ?  Passed - Patient is not pregnant  ?  ?  Passed - Valid encounter  within last 12 months  ?  Recent Outpatient Visits   ?      ? 2 weeks ago Type 2 diabetes mellitus without complication, without long-term current use of insulin ([place])  ? [facility] Jerrol Banana., MD  ? 2 months ago Sore throat  ? [facility] Caryn Section, Kirstie Peri, MD  ? 7 months ago Medicare annual wellness visit, subsequent  ? [facility] Jerrol Banana., MD  ? 1 year ago Type 2 diabetes mellitus without complication, without long-term current use of insulin ([place])  ? [facility] Jerrol Banana., MD  ? 1 year ago Type 2 diabetes mellitus without complication, without long-term current use of insulin ([place])  ? [facility] Jerrol Banana., MD  ?  ?  ?Future Appointments   ?        ? In 4 months Jerrol Banana., MD [facility], [facility]  ?  ? ?  ?  ?  ? ? ?

## 2021-04-29 DIAGNOSIS — M955 Acquired deformity of pelvis: Secondary | ICD-10-CM | POA: Diagnosis not present

## 2021-04-29 DIAGNOSIS — M9905 Segmental and somatic dysfunction of pelvic region: Secondary | ICD-10-CM | POA: Diagnosis not present

## 2021-04-29 DIAGNOSIS — M62830 Muscle spasm of back: Secondary | ICD-10-CM | POA: Diagnosis not present

## 2021-04-29 DIAGNOSIS — M9903 Segmental and somatic dysfunction of lumbar region: Secondary | ICD-10-CM | POA: Diagnosis not present

## 2021-05-20 DIAGNOSIS — M955 Acquired deformity of pelvis: Secondary | ICD-10-CM | POA: Diagnosis not present

## 2021-05-20 DIAGNOSIS — M9905 Segmental and somatic dysfunction of pelvic region: Secondary | ICD-10-CM | POA: Diagnosis not present

## 2021-05-20 DIAGNOSIS — M9903 Segmental and somatic dysfunction of lumbar region: Secondary | ICD-10-CM | POA: Diagnosis not present

## 2021-05-20 DIAGNOSIS — M62830 Muscle spasm of back: Secondary | ICD-10-CM | POA: Diagnosis not present

## 2021-06-04 DIAGNOSIS — M9905 Segmental and somatic dysfunction of pelvic region: Secondary | ICD-10-CM | POA: Diagnosis not present

## 2021-06-04 DIAGNOSIS — M62830 Muscle spasm of back: Secondary | ICD-10-CM | POA: Diagnosis not present

## 2021-06-04 DIAGNOSIS — M955 Acquired deformity of pelvis: Secondary | ICD-10-CM | POA: Diagnosis not present

## 2021-06-04 DIAGNOSIS — M9903 Segmental and somatic dysfunction of lumbar region: Secondary | ICD-10-CM | POA: Diagnosis not present

## 2021-06-16 DIAGNOSIS — M62830 Muscle spasm of back: Secondary | ICD-10-CM | POA: Diagnosis not present

## 2021-06-16 DIAGNOSIS — M9903 Segmental and somatic dysfunction of lumbar region: Secondary | ICD-10-CM | POA: Diagnosis not present

## 2021-06-16 DIAGNOSIS — M955 Acquired deformity of pelvis: Secondary | ICD-10-CM | POA: Diagnosis not present

## 2021-06-16 DIAGNOSIS — M9905 Segmental and somatic dysfunction of pelvic region: Secondary | ICD-10-CM | POA: Diagnosis not present

## 2021-06-30 DIAGNOSIS — M9903 Segmental and somatic dysfunction of lumbar region: Secondary | ICD-10-CM | POA: Diagnosis not present

## 2021-06-30 DIAGNOSIS — M9905 Segmental and somatic dysfunction of pelvic region: Secondary | ICD-10-CM | POA: Diagnosis not present

## 2021-06-30 DIAGNOSIS — M62830 Muscle spasm of back: Secondary | ICD-10-CM | POA: Diagnosis not present

## 2021-06-30 DIAGNOSIS — M955 Acquired deformity of pelvis: Secondary | ICD-10-CM | POA: Diagnosis not present

## 2021-07-21 ENCOUNTER — Other Ambulatory Visit: Payer: Self-pay | Admitting: Family Medicine

## 2021-07-22 DIAGNOSIS — M62830 Muscle spasm of back: Secondary | ICD-10-CM | POA: Diagnosis not present

## 2021-07-22 DIAGNOSIS — M9903 Segmental and somatic dysfunction of lumbar region: Secondary | ICD-10-CM | POA: Diagnosis not present

## 2021-07-22 DIAGNOSIS — M9905 Segmental and somatic dysfunction of pelvic region: Secondary | ICD-10-CM | POA: Diagnosis not present

## 2021-07-22 DIAGNOSIS — M955 Acquired deformity of pelvis: Secondary | ICD-10-CM | POA: Diagnosis not present

## 2021-08-18 DIAGNOSIS — M955 Acquired deformity of pelvis: Secondary | ICD-10-CM | POA: Diagnosis not present

## 2021-08-18 DIAGNOSIS — M9905 Segmental and somatic dysfunction of pelvic region: Secondary | ICD-10-CM | POA: Diagnosis not present

## 2021-08-18 DIAGNOSIS — M62830 Muscle spasm of back: Secondary | ICD-10-CM | POA: Diagnosis not present

## 2021-08-18 DIAGNOSIS — M9903 Segmental and somatic dysfunction of lumbar region: Secondary | ICD-10-CM | POA: Diagnosis not present

## 2021-08-25 ENCOUNTER — Other Ambulatory Visit: Payer: Self-pay | Admitting: Family Medicine

## 2021-08-26 NOTE — Telephone Encounter (Signed)
Requested medication (s) are due for refill today:   Yes  Requested medication (s) are on the active medication list:   Yes  Future visit scheduled:   Yes in 4 weeks   Last ordered: 01/20/2021 #90, 3 refills  No refills remain and labs due reason returned   Requested Prescriptions  Pending Prescriptions Disp Refills   hydrochlorothiazide (MICROZIDE) 12.5 MG capsule [Pharmacy Med Name: HYDROCHLOROTHIAZIDE 12.5 MG CAP] 90 capsule 3    Sig: TAKE 1 CAPSULE BY MOUTH ONCE DAILY     Cardiovascular: Diuretics - Thiazide Failed - 08/25/2021 10:57 AM      Failed - Cr in normal range and within 180 days    Creat  Date Value Ref Range Status  11/30/2016 1.62 (H) 0.60 - 0.93 mg/dL Final    Comment:    For patients >34 years of age, the reference limit for Creatinine is approximately 13% higher for people identified as African-American. .    Creatinine, Ser  Date Value Ref Range Status  09/09/2020 2.04 (H) 0.57 - 1.00 mg/dL Final         Failed - K in normal range and within 180 days    Potassium  Date Value Ref Range Status  09/09/2020 4.1 3.5 - 5.2 mmol/L Final  07/01/2012 4.1 3.5 - 5.1 mmol/L Final         Failed - Na in normal range and within 180 days    Sodium  Date Value Ref Range Status  09/09/2020 142 134 - 144 mmol/L Final  07/01/2012 137 136 - 145 mmol/L Final         Passed - Last BP in normal range    BP Readings from Last 1 Encounters:  04/09/21 136/70         Passed - Valid encounter within last 6 months    Recent Outpatient Visits           4 months ago Type 2 diabetes mellitus without complication, without long-term current use of insulin [facility])   [facility] Jerrol Banana., MD   6 months ago Sore throat   [facility] Birdie Sons, MD   11 months ago Medicare annual wellness visit, subsequent   [facility] Jerrol Banana., MD   1 year ago Type 2 diabetes mellitus without complication,  without long-term current use of insulin [facility])   [facility] Jerrol Banana., MD   1 year ago Type 2 diabetes mellitus without complication, without long-term current use of insulin [facility])   [facility] Jerrol Banana., MD       Future Appointments             In 4 weeks Jerrol Banana., MD [facility], [facility]

## 2021-08-26 NOTE — Telephone Encounter (Signed)
Patient due for labs and OV. Next appt is 09/23/2021 with Dr. Rosanna Randy. I called patient and [facility] pharmacy and confirmed that she has enough medication to last until her upcomming appt.

## 2021-09-01 ENCOUNTER — Other Ambulatory Visit: Payer: Self-pay | Admitting: Family Medicine

## 2021-09-08 DIAGNOSIS — M9903 Segmental and somatic dysfunction of lumbar region: Secondary | ICD-10-CM | POA: Diagnosis not present

## 2021-09-08 DIAGNOSIS — M955 Acquired deformity of pelvis: Secondary | ICD-10-CM | POA: Diagnosis not present

## 2021-09-08 DIAGNOSIS — M62830 Muscle spasm of back: Secondary | ICD-10-CM | POA: Diagnosis not present

## 2021-09-08 DIAGNOSIS — M9905 Segmental and somatic dysfunction of pelvic region: Secondary | ICD-10-CM | POA: Diagnosis not present

## 2021-09-22 NOTE — Progress Notes (Unsigned)
Established patient visit  I,Dennice Tindol,acting as a scribe for Wilhemena Durie, MD.,have documented all relevant documentation on the behalf of Wilhemena Durie, MD,as directed by  Wilhemena Durie, MD while in the presence of Wilhemena Durie, MD.   Patient: Tracy Silva   DOB: [birth year]   81 y.o. Female  MRN: [id number] Visit Date: 09/23/2021  Today's healthcare provider: Wilhemena Durie, MD   Chief Complaint  Patient presents with   Follow-up   Hypertension   Diabetes   Hyperlipidemia   Subjective    HPI  Diabetes Mellitus Type II, follow-up  Lab Results  Component Value Date   HGBA1C 6.0 (H) 09/09/2020   HGBA1C 6.3 (A) 04/11/2020   HGBA1C 6.4 (A) 12/12/2019   Last seen for diabetes 5 months ago.  Management since then includes continuing the same treatment.  Home blood sugar records: fasting range: not checking Most Recent Eye Exam: need information  --------------------------------------------------------------------------------------------------- Hypertension, follow-up  BP Readings from Last 3 Encounters:  04/09/21 136/70  02/17/21 (!) 117/53  09/05/20 110/65   Wt Readings from Last 3 Encounters:  04/09/21 156 lb (70.8 kg)  02/17/21 159 lb 3.2 oz (72.2 kg)  09/05/20 161 lb (73 kg)     She was last seen for hypertension 5 months ago.  Management since that visit includes; amlodipine-benazepril 5-10 mg, HCTZ, and metoprolol 50 mg.  Outside blood pressures are fasting range: not checking. --------------------------------------------------------------------------------------------------- Lipid/Cholesterol, follow-up  Last Lipid Panel: Lab Results  Component Value Date   CHOL 239 (H) 09/09/2020   LDLCALC 149 (H) 09/09/2020   HDL 55 09/09/2020   TRIG 193 (H) 09/09/2020    She was last seen for this 1 years ago.  Management since that visit includes; on Zetia 10 mg.  Last metabolic panel Lab Results  Component Value Date    GLUCOSE 108 (H) 09/09/2020   NA 142 09/09/2020   K 4.1 09/09/2020   BUN 41 (H) 09/09/2020   CREATININE 2.04 (H) 09/09/2020   EGFR 24 (L) 09/09/2020   GFRNONAA 21 (L) 08/17/2019   CALCIUM 9.7 09/09/2020   AST 19 09/09/2020   ALT 8 09/09/2020   The ASCVD Risk score (Arnett DK, et al., 2019) failed to calculate for the following reasons:   The 2019 ASCVD risk score is only valid for ages 43 to 4  ---------------------------------------------------------------------------------------------------   Medications: Outpatient Medications Prior to Visit  Medication Sig   amLODipine-benazepril (LOTREL) 5-10 MG capsule TAKE ONE CAPSULE EVERYDAY FOR BLOOD PRESSURE   ezetimibe (ZETIA) 10 MG tablet TAKE 1 TABLET BY MOUTH EVERY MORNING   hydrochlorothiazide (MICROZIDE) 12.5 MG capsule TAKE 1 CAPSULE BY MOUTH ONCE DAILY   levothyroxine (SYNTHROID) 75 MCG tablet TAKE ONE TABLET ON AN EMPTY STOMACH WITHA GLASS OF WATER AT LEAST 30 TO 60 MINUTES BEFORE BREAKFAST   metoprolol succinate (TOPROL-XL) 50 MG 24 hr tablet TAKE 1 TABLET BY MOUTH DAILY WITH OR IMMEDIATELY FOLLOWING A MEAL   Multiple Vitamins-Calcium (ONE-A-DAY WOMENS PO) Take by mouth daily.   omeprazole (PRILOSEC OTC) 20 MG tablet Take 20 mg by mouth daily as needed.   pioglitazone (ACTOS) 45 MG tablet TAKE ONE TABLET BY MOUTH EVERY DAY   [DISCONTINUED] Acetaminophen (TYLENOL PO) Take by mouth daily as needed.   [DISCONTINUED] loratadine (CLARITIN) 10 MG tablet Take 1 tablet (10 mg total) by mouth daily. (Patient not taking: Reported on 09/23/2021)   [DISCONTINUED] montelukast (SINGULAIR) 10 MG tablet Take 1 tablet (10 mg  total) by mouth at bedtime. (Patient not taking: Reported on 09/23/2021)   No facility-administered medications prior to visit.    Review of Systems  {Labs  Heme  Chem  Endocrine  Serology  Results Review (optional):23779}   Objective    There were no vitals taken for this visit. {Show previous vital signs  (optional):23777}  Physical Exam  ***  No results found for any visits on 09/23/21.  Assessment & Plan     ***  No follow-ups on file.      {provider attestation***:1}   Wilhemena Durie, MD  [facility] [phone] (phone) [phone] (fax)  [employer]

## 2021-09-23 ENCOUNTER — Ambulatory Visit (INDEPENDENT_AMBULATORY_CARE_PROVIDER_SITE_OTHER): Payer: Medicare Other | Admitting: Family Medicine

## 2021-09-23 ENCOUNTER — Encounter: Payer: Self-pay | Admitting: Family Medicine

## 2021-09-23 VITALS — BP 98/62 | HR 89 | Resp 16 | Ht 63.0 in | Wt 148.0 lb

## 2021-09-23 DIAGNOSIS — E119 Type 2 diabetes mellitus without complications: Secondary | ICD-10-CM | POA: Diagnosis not present

## 2021-09-23 DIAGNOSIS — N183 Chronic kidney disease, stage 3 unspecified: Secondary | ICD-10-CM | POA: Diagnosis not present

## 2021-09-23 DIAGNOSIS — Z85048 Personal history of other malignant neoplasm of rectum, rectosigmoid junction, and anus: Secondary | ICD-10-CM

## 2021-09-23 DIAGNOSIS — I10 Essential (primary) hypertension: Secondary | ICD-10-CM

## 2021-09-23 DIAGNOSIS — Z6830 Body mass index (BMI) 30.0-30.9, adult: Secondary | ICD-10-CM

## 2021-09-23 DIAGNOSIS — E7800 Pure hypercholesterolemia, unspecified: Secondary | ICD-10-CM

## 2021-09-23 DIAGNOSIS — I351 Nonrheumatic aortic (valve) insufficiency: Secondary | ICD-10-CM

## 2021-09-23 DIAGNOSIS — E6609 Other obesity due to excess calories: Secondary | ICD-10-CM

## 2021-09-23 DIAGNOSIS — D649 Anemia, unspecified: Secondary | ICD-10-CM

## 2021-09-23 DIAGNOSIS — Z0000 Encounter for general adult medical examination without abnormal findings: Secondary | ICD-10-CM

## 2021-09-23 NOTE — Patient Instructions (Signed)
STOP PIOGLITAZONE. TRY PRILOSEC EVERY OTHER DAY.

## 2021-09-23 NOTE — Progress Notes (Unsigned)
Annual Wellness Visit    I,April Miller,acting as a scribe for Wilhemena Durie, MD.,have documented all relevant documentation on the behalf of Wilhemena Durie, MD,as directed by  Wilhemena Durie, MD while in the presence of Wilhemena Durie, MD.   Patient: Tracy Silva, Female    DOB: [birth year], 81 y.o.   MRN: [id number] Visit Date: 09/23/2021  Today's Provider: Wilhemena Durie, MD   Chief Complaint  Patient presents with   Medicare Wellness   Subjective    Tracy Silva is a 81 y.o. female who presents today for her Annual Wellness Visit.annual Physical. She reports consuming a general diet. The patient does not participate in regular exercise at present. She generally feels fairly well. She reports sleeping fairly well. She does not have additional problems to discuss today.   Hypertension  Diabetes  Hyperlipidemia      Medications: Outpatient Medications Prior to Visit  Medication Sig   amLODipine-benazepril (LOTREL) 5-10 MG capsule TAKE ONE CAPSULE EVERYDAY FOR BLOOD PRESSURE   ezetimibe (ZETIA) 10 MG tablet TAKE 1 TABLET BY MOUTH EVERY MORNING   hydrochlorothiazide (MICROZIDE) 12.5 MG capsule TAKE 1 CAPSULE BY MOUTH ONCE DAILY   levothyroxine (SYNTHROID) 75 MCG tablet TAKE ONE TABLET ON AN EMPTY STOMACH WITHA GLASS OF WATER AT LEAST 30 TO 60 MINUTES BEFORE BREAKFAST   metoprolol succinate (TOPROL-XL) 50 MG 24 hr tablet TAKE 1 TABLET BY MOUTH DAILY WITH OR IMMEDIATELY FOLLOWING A MEAL   Multiple Vitamins-Calcium (ONE-A-DAY WOMENS PO) Take by mouth daily.   omeprazole (PRILOSEC OTC) 20 MG tablet Take 20 mg by mouth daily as needed.   pioglitazone (ACTOS) 45 MG tablet TAKE ONE TABLET BY MOUTH EVERY DAY   [DISCONTINUED] Acetaminophen (TYLENOL PO) Take by mouth daily as needed.   [DISCONTINUED] loratadine (CLARITIN) 10 MG tablet Take 1 tablet (10 mg total) by mouth daily. (Patient not taking: Reported on 09/23/2021)   [DISCONTINUED] montelukast  (SINGULAIR) 10 MG tablet Take 1 tablet (10 mg total) by mouth at bedtime. (Patient not taking: Reported on 09/23/2021)   No facility-administered medications prior to visit.    Allergies  Allergen Reactions   Codeine Other (See Comments)    Headaches Heart palpations   Other     Wool and cashmere    Statins     Muscle pain, joint pain   Adhesive [Tape] Rash    FOR  LONG PERIODS OF TIME FOR  LONG PERIODS OF TIME    Patient Care Team: Jerrol Banana., MD as PCP - General (Family Medicine) Bary Castilla, Forest Gleason, MD (General Surgery) Jerrol Banana., MD (Family Medicine)  Review of Systems  All other systems reviewed and are negative.   {Labs  Heme  Chem  Endocrine  Serology  Results Review (optional):23779}    Objective    Vitals: BP 98/62 (BP Location: Left Arm, Patient Position: Sitting, Cuff Size: Normal)   Pulse 89   Resp 16   Ht '5\' 3"'$  (1.6 m)   Wt 148 lb (67.1 kg)   SpO2 98%   BMI 26.22 kg/m  {Show previous vital signs (optional):23777}   Physical Exam ***  Most recent functional status assessment:     No data to display         Most recent fall risk assessment:    09/05/2020   10:30 AM  Fall Risk   Falls in the past year? 0  Number falls in past yr: 0  Injury  with Fall? 0  Risk for fall due to : No Fall Risks  Follow up Falls evaluation completed    Most recent depression screenings:    09/05/2020   10:30 AM 04/11/2020    3:37 PM  PHQ 2/9 Scores  PHQ - 2 Score 0 0  PHQ- 9 Score 0 0   Most recent cognitive screening:     No data to display         Most recent Audit-C alcohol use screening    09/05/2020   10:30 AM  Alcohol Use Disorder Test (AUDIT)  1. How often do you have a drink containing alcohol? 0  2. How many drinks containing alcohol do you have on a typical day when you are drinking? 0  3. How often do you have six or more drinks on one occasion? 0  AUDIT-C Score 0   A score of 3 or more in women, and 4 or  more in men indicates increased risk for alcohol abuse, EXCEPT if all of the points are from question 1   No results found for any visits on 09/23/21.  Assessment & Plan     Annual wellness visit done today including the all of the following: Reviewed patient's Family Medical History Reviewed and updated list of patient's medical providers Assessment of cognitive impairment was done Assessed patient's functional ability Established a written schedule for health screening [place] Completed and Reviewed  Exercise Activities and Dietary recommendations  Goals   None     Immunization History  Administered Date(s) Administered   Fluad Quad(high Dose 65+) 11/07/2018, 11/13/2019, 12/11/2020   Influenza, High Dose Seasonal PF 09/27/2014, 11/01/2015, 11/25/2016, 11/03/2017   PFIZER(Purple Top)SARS-COV-2 Vaccination 12/12/2019   Pneumococcal Conjugate-13 06/05/2013   Pneumococcal Polysaccharide-23 12/23/2015   Td 12/01/2006    Health Maintenance  Topic Date Due   OPHTHALMOLOGY EXAM  Never done   Zoster Vaccines- Shingrix (1 of 2) Never done   DEXA SCAN  Never done   FOOT EXAM  10/01/2016   TETANUS/TDAP  11/30/2016   COVID-19 Vaccine (2 - Pfizer risk series) 01/02/2020   Diabetic kidney evaluation - Urine ACR  12/18/2020   HEMOGLOBIN A1C  03/12/2021   Diabetic kidney evaluation - GFR measurement  09/09/2021   INFLUENZA VACCINE  08/26/2021   Pneumonia Vaccine 32+ Years old  Completed   HPV VACCINES  Aged Out     Discussed health benefits of physical activity, and encouraged her to engage in regular exercise appropriate for her age and condition.    ***  No follow-ups on file.     {provider attestation***:1}   Wilhemena Durie, MD  [facility] [phone] (phone) [phone] (fax)  [place]

## 2021-09-30 DIAGNOSIS — E7800 Pure hypercholesterolemia, unspecified: Secondary | ICD-10-CM | POA: Diagnosis not present

## 2021-09-30 DIAGNOSIS — D649 Anemia, unspecified: Secondary | ICD-10-CM | POA: Diagnosis not present

## 2021-09-30 DIAGNOSIS — I10 Essential (primary) hypertension: Secondary | ICD-10-CM | POA: Diagnosis not present

## 2021-09-30 DIAGNOSIS — E119 Type 2 diabetes mellitus without complications: Secondary | ICD-10-CM | POA: Diagnosis not present

## 2021-09-30 DIAGNOSIS — N183 Chronic kidney disease, stage 3 unspecified: Secondary | ICD-10-CM | POA: Diagnosis not present

## 2021-10-01 LAB — CBC WITH DIFFERENTIAL/PLATELET
Basophils Absolute: 0 10*3/uL (ref 0.0–0.2)
Basos: 1 %
EOS (ABSOLUTE): 0.2 10*3/uL (ref 0.0–0.4)
Eos: 5 %
Hematocrit: 31.5 % — ABNORMAL LOW (ref 34.0–46.6)
Hemoglobin: 10.1 g/dL — ABNORMAL LOW (ref 11.1–15.9)
Immature Grans (Abs): 0 10*3/uL (ref 0.0–0.1)
Immature Granulocytes: 0 %
Lymphocytes Absolute: 1.4 10*3/uL (ref 0.7–3.1)
Lymphs: 45 %
MCH: 32.5 pg (ref 26.6–33.0)
MCHC: 32.1 g/dL (ref 31.5–35.7)
MCV: 101 fL — ABNORMAL HIGH (ref 79–97)
Monocytes Absolute: 0.4 10*3/uL (ref 0.1–0.9)
Monocytes: 12 %
Neutrophils Absolute: 1.2 10*3/uL — ABNORMAL LOW (ref 1.4–7.0)
Neutrophils: 37 %
Platelets: 184 10*3/uL (ref 150–450)
RBC: 3.11 x10E6/uL — ABNORMAL LOW (ref 3.77–5.28)
RDW: 13.5 % (ref 11.7–15.4)
WBC: 3.1 10*3/uL — ABNORMAL LOW (ref 3.4–10.8)

## 2021-10-01 LAB — COMPREHENSIVE METABOLIC PANEL
ALT: 10 IU/L (ref 0–32)
AST: 21 IU/L (ref 0–40)
Albumin/Globulin Ratio: 1.9 (ref 1.2–2.2)
Albumin: 4.2 g/dL (ref 3.7–4.7)
Alkaline Phosphatase: 65 IU/L (ref 44–121)
BUN/Creatinine Ratio: 20 (ref 12–28)
BUN: 40 mg/dL — ABNORMAL HIGH (ref 8–27)
Bilirubin Total: 0.2 mg/dL (ref 0.0–1.2)
CO2: 21 mmol/L (ref 20–29)
Calcium: 9.5 mg/dL (ref 8.7–10.3)
Chloride: 108 mmol/L — ABNORMAL HIGH (ref 96–106)
Creatinine, Ser: 1.96 mg/dL — ABNORMAL HIGH (ref 0.57–1.00)
Globulin, Total: 2.2 g/dL (ref 1.5–4.5)
Glucose: 97 mg/dL (ref 70–99)
Potassium: 4.3 mmol/L (ref 3.5–5.2)
Sodium: 144 mmol/L (ref 134–144)
Total Protein: 6.4 g/dL (ref 6.0–8.5)
eGFR: 25 mL/min/{1.73_m2} — ABNORMAL LOW (ref >59)

## 2021-10-01 LAB — HEMOGLOBIN A1C
Est. average glucose Bld gHb Est-mCnc: 131 mg/dL
Hgb A1c MFr Bld: 6.2 % — ABNORMAL HIGH (ref 4.8–5.6)

## 2021-10-01 LAB — LIPID PANEL
Chol/HDL Ratio: 4 ratio (ref 0.0–4.4)
Cholesterol, Total: 212 mg/dL — ABNORMAL HIGH (ref 100–199)
HDL: 53 mg/dL (ref >39)
LDL Chol Calc (NIH): 130 mg/dL — ABNORMAL HIGH (ref 0–99)
Triglycerides: 162 mg/dL — ABNORMAL HIGH (ref 0–149)
VLDL Cholesterol Cal: 29 mg/dL (ref 5–40)

## 2021-10-01 LAB — TSH: TSH: 7.26 u[IU]/mL — ABNORMAL HIGH (ref 0.450–4.500)

## 2021-10-09 ENCOUNTER — Other Ambulatory Visit: Payer: Self-pay | Admitting: Family Medicine

## 2021-10-09 DIAGNOSIS — E119 Type 2 diabetes mellitus without complications: Secondary | ICD-10-CM

## 2021-10-27 ENCOUNTER — Other Ambulatory Visit: Payer: Self-pay | Admitting: Family Medicine

## 2021-10-27 DIAGNOSIS — I10 Essential (primary) hypertension: Secondary | ICD-10-CM

## 2021-11-03 ENCOUNTER — Ambulatory Visit (INDEPENDENT_AMBULATORY_CARE_PROVIDER_SITE_OTHER): Payer: Medicare Other | Admitting: Family Medicine

## 2021-11-03 ENCOUNTER — Encounter: Payer: Self-pay | Admitting: Family Medicine

## 2021-11-03 VITALS — BP 90/58 | HR 81 | Temp 98.3°F | Resp 16 | Wt 151.3 lb

## 2021-11-03 DIAGNOSIS — J019 Acute sinusitis, unspecified: Secondary | ICD-10-CM | POA: Diagnosis not present

## 2021-11-03 DIAGNOSIS — R040 Epistaxis: Secondary | ICD-10-CM | POA: Insufficient documentation

## 2021-11-03 DIAGNOSIS — R059 Cough, unspecified: Secondary | ICD-10-CM | POA: Diagnosis not present

## 2021-11-03 LAB — POC COVID19 BINAXNOW: SARS Coronavirus 2 Ag: NEGATIVE

## 2021-11-03 MED ORDER — AMOXICILLIN-POT CLAVULANATE 875-125 MG PO TABS: 1.0000 | ORAL_TABLET | Freq: Two times a day (BID) | ORAL | 0 refills | Status: AC

## 2021-11-03 NOTE — Assessment & Plan Note (Signed)
Acute  Patient denies fevers and chills covid test in office was negative  Recommended follow up if she develops SOB along with cough

## 2021-11-03 NOTE — Patient Instructions (Signed)
Afrin nasal spray please use this as directed for no more than three days to help with your nosebleed

## 2021-11-03 NOTE — Assessment & Plan Note (Signed)
Acute  Will treat with Augmentin for 10 day course  Patient to follow up if no improvement during treatment course  COVID negative

## 2021-11-03 NOTE — Assessment & Plan Note (Signed)
Acute Suspect this is due to irritation in setting of likely sinus infection  Recommended three days maximum treatment with Afrin nasal spray

## 2021-11-03 NOTE — Progress Notes (Signed)
I,Joseline E Rosas,acting as a scribe for [employer], MD.,have documented all relevant documentation on the behalf of Eulis Foster, MD,as directed by  Eulis Foster, MD while in the presence of Eulis Foster, MD.   Established patient visit   Patient: Tracy Silva   DOB: 24-May-1940   81 y.o. Female  MRN: [id number] Visit Date: 11/03/2021  Today's healthcare provider: Eulis Foster, MD   Chief Complaint  Patient presents with   URI   Subjective    URI  This is a new problem. The current episode started 1 to 4 weeks ago. The problem has been gradually worsening. There has been no fever. Associated symptoms include congestion, coughing, ear pain (right), rhinorrhea and sinus pain. Pertinent negatives include no abdominal pain, chest pain, diarrhea, dysuria, headaches, nausea, plugged ear sensation, sneezing, vomiting or wheezing. Sore throat: irritation.Associated symptoms comments: Nosebleed since yesterday. Treatments tried: vicks vapor rub, orange juice. The treatment provided mild relief.     Medications: Outpatient Medications Prior to Visit  Medication Sig   amLODipine-benazepril (LOTREL) 5-10 MG capsule TAKE ONE CAPSULE EVERYDAY FOR BLOOD PRESSURE   ezetimibe (ZETIA) 10 MG tablet TAKE 1 TABLET BY MOUTH EVERY MORNING   hydrochlorothiazide (MICROZIDE) 12.5 MG capsule TAKE 1 CAPSULE BY MOUTH ONCE DAILY   levothyroxine (SYNTHROID) 75 MCG tablet TAKE ONE TABLET ON AN EMPTY STOMACH WITHA GLASS OF WATER AT LEAST 30 TO 60 MINUTES BEFORE BREAKFAST   metoprolol succinate (TOPROL-XL) 50 MG 24 hr tablet TAKE 1 TABLET BY MOUTH DAILY WITH OR IMMEDIATELY FOLLOWING A MEAL   Multiple Vitamins-Calcium (ONE-A-DAY WOMENS PO) Take by mouth daily.   omeprazole (PRILOSEC OTC) 20 MG tablet Take 20 mg by mouth daily as needed.   [DISCONTINUED] pioglitazone (ACTOS) 45 MG tablet TAKE ONE TABLET BY MOUTH EVERY DAY   No facility-administered  medications prior to visit.    Review of Systems  HENT:  Positive for congestion, ear pain (right), rhinorrhea and sinus pain. Negative for sneezing. Sore throat: irritation.  Respiratory:  Positive for cough. Negative for wheezing.   Cardiovascular:  Negative for chest pain.  Gastrointestinal:  Negative for abdominal pain, diarrhea, nausea and vomiting.  Genitourinary:  Negative for dysuria.  Neurological:  Negative for headaches.       Objective    BP (!) 90/58 (BP Location: Left Arm, Patient Position: Sitting, Cuff Size: Normal)   Pulse 81   Temp 98.3 F (36.8 C) (Oral)   Resp 16   Wt 151 lb 4.8 oz (68.6 kg)   BMI 26.80 kg/m    Physical Exam HENT:     Head: Normocephalic and atraumatic. No raccoon eyes, right periorbital erythema or left periorbital erythema.     Right Ear: Decreased hearing noted. No drainage. There is impacted cerumen.     Left Ear: Tympanic membrane normal. No decreased hearing noted. No drainage.  No middle ear effusion. There is no impacted cerumen. Tympanic membrane is not perforated or erythematous.     Ears:     Comments: Right ear canal occluded with dried and hardened cerumen limiting ability to visualize right TM     Nose: Nasal tenderness, congestion and rhinorrhea present.     Right Nostril: Epistaxis present. No occlusion.     Left Nostril: No epistaxis or occlusion.     Right Sinus: No maxillary sinus tenderness or frontal sinus tenderness.     Left Sinus: No maxillary sinus tenderness or frontal sinus tenderness.     Mouth/Throat:  Pharynx: Oropharynx is clear. No oropharyngeal exudate or posterior oropharyngeal erythema.  Cardiovascular:     Rate and Rhythm: Normal rate and regular rhythm.     Heart sounds: Murmur heard.  Pulmonary:     Effort: Pulmonary effort is normal. No respiratory distress.     Breath sounds: Normal breath sounds. No wheezing, rhonchi or rales.       No results found for any visits on 11/03/21.   Assessment & Plan     Problem List Items Addressed This Visit       Respiratory   Acute non-recurrent sinusitis - Primary    Acute  Will treat with Augmentin for 10 day course  Patient to follow up if no improvement during treatment course  COVID negative       Relevant Medications   amoxicillin-clavulanate (AUGMENTIN) 875-125 MG tablet     Other   Cough in adult    Acute  Patient denies fevers and chills covid test in office was negative  Recommended follow up if she develops SOB along with cough       Relevant Orders   POC COVID-19   Right-sided epistaxis    Acute Suspect this is due to irritation in setting of likely sinus infection  Recommended three days maximum treatment with Afrin nasal spray         Return if symptoms worsen or fail to improve.     I, Eulis Foster, MD, have reviewed all documentation for this visit. The documentation on 11/03/21 for the exam, diagnosis, procedures, and orders are all accurate and complete.    Eulis Foster, MD  [facility] [phone] (phone) [phone] (fax)  [place]

## 2021-11-26 ENCOUNTER — Telehealth: Payer: Self-pay | Admitting: Family Medicine

## 2021-11-26 NOTE — Telephone Encounter (Signed)
[facility] faxed refill request for the following medications:  hydrochlorothiazide (MICROZIDE) 12.5 MG capsule   Please advise.

## 2021-11-26 NOTE — Telephone Encounter (Signed)
I would recommend stopping the HCTZ. No refill needed.   Eulis Foster, MD  [facility]  [phone]

## 2021-11-26 NOTE — Telephone Encounter (Signed)
Does she need a refill for this? BP reading were low when she came in. Dr. Marlan Palau last office visit said  "Blood pressure remains low consider backing off on her regimen would take away diuretic first"

## 2021-11-27 NOTE — Telephone Encounter (Signed)
Called, left VM to call back regarding medication refill.

## 2021-11-27 NOTE — Telephone Encounter (Signed)
Called patient to advise that she stop taking HCTZ. Unable to leave vm. Okay for PEC to advise.

## 2021-11-28 NOTE — Telephone Encounter (Signed)
LMTCB if any concerns or questions.

## 2021-11-28 NOTE — Telephone Encounter (Signed)
Pt called back and advised that she is to stop taking HCTZ. Pt seemed quite puzzled and NT had to read the note form PCP several times. She may be clear now but not sure. Can someone from practice call her to reiterate this information?

## 2021-12-01 NOTE — Telephone Encounter (Signed)
Pt is calling back to insists that Dr. Rosanna Randy did not advise her to stop taking her HCTZ that it was her diabetic medications. Pt is request a call back because she needs her HCTZ. Please advise CB- [phone]

## 2021-12-03 ENCOUNTER — Encounter: Payer: Self-pay | Admitting: Family Medicine

## 2021-12-03 ENCOUNTER — Ambulatory Visit (INDEPENDENT_AMBULATORY_CARE_PROVIDER_SITE_OTHER): Payer: Medicare Other | Admitting: Family Medicine

## 2021-12-03 VITALS — BP 97/42 | HR 70 | Resp 16 | Wt 149.0 lb

## 2021-12-03 DIAGNOSIS — Z780 Asymptomatic menopausal state: Secondary | ICD-10-CM

## 2021-12-03 DIAGNOSIS — E669 Obesity, unspecified: Secondary | ICD-10-CM | POA: Diagnosis not present

## 2021-12-03 DIAGNOSIS — E1169 Type 2 diabetes mellitus with other specified complication: Secondary | ICD-10-CM | POA: Diagnosis not present

## 2021-12-03 DIAGNOSIS — I10 Essential (primary) hypertension: Secondary | ICD-10-CM | POA: Diagnosis not present

## 2021-12-03 MED ORDER — METOPROLOL SUCCINATE ER 25 MG PO TB24: 25.0000 mg | ORAL_TABLET | Freq: Every day | ORAL | 1 refills | Status: DC

## 2021-12-03 NOTE — Patient Instructions (Signed)
Please start taking the '25mg'$  metoprolol daily instead of the '50mg'$  pill   We will test your urine today   I will see you back on 11/28 at 1:20 PM   Please monitor your blood pressure 2-3 times per week with a goal of greater than 100/70, less than 140/90.

## 2021-12-03 NOTE — Progress Notes (Signed)
I,Tracy Silva,acting as a scribe for [employer], MD.,have documented all relevant documentation on the behalf of Tracy Foster, MD,as directed by  Tracy Foster, MD while in the presence of Tracy Foster, MD.   Established patient visit   Patient: Tracy Silva   DOB: 1940-11-17   81 y.o. Female  MRN: [id number] Visit Date: 12/03/2021  Today's healthcare provider: Eulis Foster, MD   Chief Complaint  Patient presents with   Follow-up   Hypertension   Subjective    HPI   Hypertension, follow-up  BP Readings from Last 3 Encounters:  12/03/21 (!) 97/42  11/03/21 (!) 90/58  09/23/21 98/62   Wt Readings from Last 3 Encounters:  12/03/21 149 lb (67.6 kg)  11/03/21 151 lb 4.8 oz (68.6 kg)  09/23/21 148 lb (67.1 kg)     She was last seen for hypertension 3 months ago.  Management since that visit includes; Blood pressure remains low consider backing off on her regimen would take away diuretic first. .  Outside blood pressures are not checking.  Pertinent labs Lab Results  Component Value Date   CHOL 212 (H) 09/30/2021   HDL 53 09/30/2021   LDLCALC 130 (H) 09/30/2021   TRIG 162 (H) 09/30/2021   CHOLHDL 4.0 09/30/2021   Lab Results  Component Value Date   NA 144 09/30/2021   K 4.3 09/30/2021   CREATININE 1.96 (H) 09/30/2021   EGFR 25 (L) 09/30/2021   GLUCOSE 97 09/30/2021   TSH 7.260 (H) 09/30/2021     The ASCVD Risk score (Arnett DK, et al., 2019) failed to calculate for the following reasons:   The 2019 ASCVD risk score is only valid for ages 40 to 46  ---------------------------------------------------------------------------------------------------   Medications: Outpatient Medications Prior to Visit  Medication Sig   ezetimibe (ZETIA) 10 MG tablet TAKE 1 TABLET BY MOUTH EVERY MORNING   levothyroxine (SYNTHROID) 75 MCG tablet TAKE ONE TABLET ON AN EMPTY STOMACH WITHA GLASS OF WATER AT LEAST 30  TO 60 MINUTES BEFORE BREAKFAST   Multiple Vitamins-Calcium (ONE-A-DAY WOMENS PO) Take by mouth daily.   omeprazole (PRILOSEC OTC) 20 MG tablet Take 20 mg by mouth daily as needed.   [DISCONTINUED] amLODipine-benazepril (LOTREL) 5-10 MG capsule TAKE ONE CAPSULE EVERYDAY FOR BLOOD PRESSURE   [DISCONTINUED] hydrochlorothiazide (MICROZIDE) 12.5 MG capsule TAKE 1 CAPSULE BY MOUTH ONCE DAILY   [DISCONTINUED] metoprolol succinate (TOPROL-XL) 50 MG 24 hr tablet TAKE 1 TABLET BY MOUTH DAILY WITH OR IMMEDIATELY FOLLOWING A MEAL   No facility-administered medications prior to visit.    Review of Systems  Constitutional:  Negative for appetite change, chills, fatigue and fever.  Respiratory:  Negative for chest tightness and shortness of breath.   Cardiovascular:  Negative for chest pain and palpitations.  Gastrointestinal:  Negative for abdominal pain, nausea and vomiting.  Neurological:  Negative for dizziness and weakness.       Objective    BP (!) 97/42 (BP Location: Left Arm, Patient Position: Sitting, Cuff Size: Normal)   Pulse 70   Resp 16   Wt 149 lb (67.6 kg)   SpO2 99%   BMI 26.39 kg/m    Physical Exam Vitals reviewed.  Constitutional:      General: She is not in acute distress.    Appearance: Normal appearance. She is not ill-appearing, toxic-appearing or diaphoretic.  Eyes:     Conjunctiva/sclera: Conjunctivae normal.  Cardiovascular:     Rate and Rhythm: Normal rate and regular rhythm.  Pulses:          Dorsalis pedis pulses are 1+ on the right side.       Posterior tibial pulses are 2+ on the right side and 2+ on the left side.     Heart sounds: Murmur heard.     No friction rub. No gallop.  Pulmonary:     Effort: Pulmonary effort is normal. No respiratory distress.     Breath sounds: Normal breath sounds. No stridor. No wheezing, rhonchi or rales.  Abdominal:     General: Bowel sounds are normal. There is no distension.     Palpations: Abdomen is soft.      Tenderness: There is no abdominal tenderness.  Musculoskeletal:     Right lower leg: No edema.     Left lower leg: No edema.     Left foot: Deformity present.  Feet:     Right foot:     Skin integrity: Skin integrity normal.     Toenail Condition: Right toenails are abnormally thick.     Left foot:     Skin integrity: Skin integrity normal.     Toenail Condition: Left toenails are abnormally thick.  Skin:    Findings: No erythema or rash.  Neurological:     Mental Status: She is alert and oriented to person, place, and time.     No results found for any visits on 12/03/21.   Assessment & Plan     Problem List Items Addressed This Visit       Cardiovascular and Mediastinum   Essential (primary) hypertension - Primary    BP hypotensive  Updated medication list Discontinued HCTZ and amlodipine-ACEi  Decreased metoprolol succinate to 61m daily from 561mdaily  Patient to follow up 12/23/21 for BP recheck  Recommended checking BP 2-3 times per week and monitoring for any symptoms of dizziness, patient voiced understanding       Relevant Medications   metoprolol succinate (TOPROL-XL) 25 MG 24 hr tablet     Endocrine   Diabetes mellitus type 2 in obese ([place]  Relevant Orders   Urine microalbumin-creatinine with uACR     Other   Postmenopausal estrogen deficiency   Relevant Orders   DG Bone Density     Return in about 20 days (around 12/23/2021) for hypotension .     I, MaEulis FosterMD, have reviewed all documentation for this visit. The documentation on 12/03/21 for the exam, diagnosis, procedures, and orders are all accurate and complete.  Portions of this information were initially documented by the CMA and reviewed by me for thoroughness and accuracy.      MaEulis FosterMD  [phone]) [phone])  [place]

## 2021-12-03 NOTE — Assessment & Plan Note (Signed)
BP hypotensive  Updated medication list Discontinued HCTZ and amlodipine-ACEi  Decreased metoprolol succinate to '25mg'$  daily from '50mg'$  daily  Patient to follow up 12/23/21 for BP recheck  Recommended checking BP 2-3 times per week and monitoring for any symptoms of dizziness, patient voiced understanding

## 2021-12-03 NOTE — Telephone Encounter (Signed)
Discussed during visit today   BP hypotensive    Discontinued BP medications and updated list   Patient will now take '25mg'$  metoprolol daily and follow up scheduled for 12/23/21    Eulis Foster, MD  [facility]  [phone]

## 2021-12-04 ENCOUNTER — Ambulatory Visit: Payer: Self-pay | Admitting: *Deleted

## 2021-12-04 NOTE — Telephone Encounter (Signed)
  Chief Complaint: Question about Amlodipine-Benazepril.  What instructions did Dr. Julienne Kass her regarding how to take it or should she still take it from [place] yesterday. Symptoms: N/A Frequency: N/A Pertinent Negatives: Patient denies N/A Disposition: '[]'$ ED /'[]'$ Urgent Care (no appt availability in office) / '[]'$ Appointment(In office/virtual)/ '[]'$  [facility]/ '[x]'$ Home Care/ '[]'$ Refused Recommended Disposition /'[]'$ [facility]/ '[]'$  Follow-up with PCP Additional Notes: By referring to OV notes from yesterday I answered her question.  She is to stop the Amlodipine-Benazepril.  The dose of the metoprolol was reduced to 25 mg and to check with her pharmacy for it.  Pt. Verbalized understanding of new instructions for her BP medication.

## 2021-12-04 NOTE — Telephone Encounter (Signed)
Message from [place] sent at 12/04/2021 10:02 AM EST  Summary: med management   Pt stated she had an appointment with PCP yesterday and stated she failed to tell her about medication amLODipine-benazepril (LOTREL) 5-10 MG capsule. Pt stated it has do with her blood pressure, and she needs to know if she needs to continue taking it or not.  Pt seeking clinical advice          Call History   Type Contact Phone/Fax User  12/04/2021 09:52 AM EST Phone (Incoming) [place], [place] (Self) [phone] Lemmie Evens) [place]ill, Alondra   Reason for Disposition  Caller has medicine question only, adult not sick, AND triager answers question  Answer Assessment - Initial Assessment Questions 1. NAME of MEDICINE: "What medicine(s) are you calling about?"     Amlodipine-benazepril 5-10 mg 2. QUESTION: "What is your question?" (e.g., double dose of medicine, side effect)     Should I continue taking the Amlodipine-Benazepril?   I forgot to tell the dr. about it yesterday while I was there for my visit.  Referring to Dr. Vickki Muff notes from [place] yesterday I let pt. Know she was to stop the Amlodipine-Benazepril.  Her BP was too low so the dr. Geraldine Solar her to stop that medication.   Pt. Verbalized understanding and repeatedly it back to me that she was not to take the Amlodipine-Benazepril any longer.    She lowered my dose of metoprolol to 25 mg.   I let her know that was correct.  She is to take this and to check with her pharmacy.    3. PRESCRIBER: "Who prescribed the medicine?" Reason: if prescribed by specialist, call should be referred to that group.     Dr. Alba Cory. 4. SYMPTOMS: "Do you have any symptoms?" If Yes, ask: "What symptoms are you having?"  "How bad are the symptoms (e.g., mild, moderate, severe)     N/A 5. PREGNANCY:  "Is there any chance that you are pregnant?" "When was your last menstrual period?"     N/A  Protocols used: Medication Question Call-A-AH

## 2021-12-05 LAB — MICROALBUMIN / CREATININE URINE RATIO
Creatinine, Urine: 229.5 mg/dL
Microalb/Creat Ratio: 27 mg/g creat (ref 0–29)
Microalbumin, Urine: 62.2 ug/mL

## 2021-12-05 LAB — SPECIMEN STATUS REPORT

## 2021-12-16 ENCOUNTER — Inpatient Hospital Stay
Admit: 2021-12-16 | Discharge: 2021-12-16 | Disposition: A | Discharge status: Home / Self Care | Payer: Medicare Other | Attending: Cardiology | Admitting: Cardiology

## 2021-12-16 ENCOUNTER — Emergency Department: Payer: Medicare Other

## 2021-12-16 ENCOUNTER — Other Ambulatory Visit: Payer: Self-pay

## 2021-12-16 ENCOUNTER — Inpatient Hospital Stay
Admission: EM | Admit: 2021-12-16 | Discharge: 2021-12-23 | DRG: 280 | Disposition: A | Discharge status: Home Health | Payer: Medicare Other | Attending: Internal Medicine | Admitting: Internal Medicine

## 2021-12-16 DIAGNOSIS — Z803 Family history of malignant neoplasm of breast: Secondary | ICD-10-CM

## 2021-12-16 DIAGNOSIS — Z885 Allergy status to narcotic agent status: Secondary | ICD-10-CM | POA: Diagnosis not present

## 2021-12-16 DIAGNOSIS — Z1152 Encounter for screening for COVID-19: Secondary | ICD-10-CM

## 2021-12-16 DIAGNOSIS — I447 Left bundle-branch block, unspecified: Secondary | ICD-10-CM | POA: Diagnosis present

## 2021-12-16 DIAGNOSIS — R079 Chest pain, unspecified: Secondary | ICD-10-CM | POA: Diagnosis not present

## 2021-12-16 DIAGNOSIS — I5031 Acute diastolic (congestive) heart failure: Secondary | ICD-10-CM

## 2021-12-16 DIAGNOSIS — Z79899 Other long term (current) drug therapy: Secondary | ICD-10-CM

## 2021-12-16 DIAGNOSIS — R531 Weakness: Secondary | ICD-10-CM | POA: Diagnosis present

## 2021-12-16 DIAGNOSIS — D509 Iron deficiency anemia, unspecified: Secondary | ICD-10-CM | POA: Diagnosis present

## 2021-12-16 DIAGNOSIS — J9601 Acute respiratory failure with hypoxia: Secondary | ICD-10-CM | POA: Diagnosis present

## 2021-12-16 DIAGNOSIS — Z85528 Personal history of other malignant neoplasm of kidney: Secondary | ICD-10-CM

## 2021-12-16 DIAGNOSIS — I130 Hypertensive heart and chronic kidney disease with heart failure and stage 1 through stage 4 chronic kidney disease, or unspecified chronic kidney disease: Secondary | ICD-10-CM | POA: Diagnosis present

## 2021-12-16 DIAGNOSIS — N1832 Chronic kidney disease, stage 3b: Secondary | ICD-10-CM | POA: Diagnosis present

## 2021-12-16 DIAGNOSIS — Z87891 Personal history of nicotine dependence: Secondary | ICD-10-CM

## 2021-12-16 DIAGNOSIS — E1122 Type 2 diabetes mellitus with diabetic chronic kidney disease: Secondary | ICD-10-CM | POA: Diagnosis present

## 2021-12-16 DIAGNOSIS — I2584 Coronary atherosclerosis due to calcified coronary lesion: Secondary | ICD-10-CM | POA: Diagnosis present

## 2021-12-16 DIAGNOSIS — I350 Nonrheumatic aortic (valve) stenosis: Secondary | ICD-10-CM | POA: Diagnosis not present

## 2021-12-16 DIAGNOSIS — K219 Gastro-esophageal reflux disease without esophagitis: Secondary | ICD-10-CM | POA: Diagnosis present

## 2021-12-16 DIAGNOSIS — I5043 Acute on chronic combined systolic (congestive) and diastolic (congestive) heart failure: Secondary | ICD-10-CM | POA: Diagnosis present

## 2021-12-16 DIAGNOSIS — I358 Other nonrheumatic aortic valve disorders: Secondary | ICD-10-CM | POA: Diagnosis present

## 2021-12-16 DIAGNOSIS — I352 Nonrheumatic aortic (valve) stenosis with insufficiency: Secondary | ICD-10-CM | POA: Diagnosis present

## 2021-12-16 DIAGNOSIS — I214 Non-ST elevation (NSTEMI) myocardial infarction: Secondary | ICD-10-CM | POA: Diagnosis present

## 2021-12-16 DIAGNOSIS — I160 Hypertensive urgency: Secondary | ICD-10-CM | POA: Diagnosis present

## 2021-12-16 DIAGNOSIS — I878 Other specified disorders of veins: Secondary | ICD-10-CM | POA: Diagnosis present

## 2021-12-16 DIAGNOSIS — N189 Chronic kidney disease, unspecified: Secondary | ICD-10-CM

## 2021-12-16 DIAGNOSIS — Z888 Allergy status to other drugs, medicaments and biological substances status: Secondary | ICD-10-CM

## 2021-12-16 DIAGNOSIS — I2510 Atherosclerotic heart disease of native coronary artery without angina pectoris: Secondary | ICD-10-CM | POA: Diagnosis not present

## 2021-12-16 DIAGNOSIS — D649 Anemia, unspecified: Secondary | ICD-10-CM

## 2021-12-16 DIAGNOSIS — J810 Acute pulmonary edema: Secondary | ICD-10-CM | POA: Diagnosis not present

## 2021-12-16 DIAGNOSIS — F329 Major depressive disorder, single episode, unspecified: Secondary | ICD-10-CM | POA: Diagnosis present

## 2021-12-16 DIAGNOSIS — I25119 Atherosclerotic heart disease of native coronary artery with unspecified angina pectoris: Secondary | ICD-10-CM | POA: Diagnosis present

## 2021-12-16 DIAGNOSIS — Z800 Family history of malignant neoplasm of digestive organs: Secondary | ICD-10-CM

## 2021-12-16 DIAGNOSIS — N179 Acute kidney failure, unspecified: Secondary | ICD-10-CM | POA: Diagnosis present

## 2021-12-16 DIAGNOSIS — Z85048 Personal history of other malignant neoplasm of rectum, rectosigmoid junction, and anus: Secondary | ICD-10-CM | POA: Diagnosis not present

## 2021-12-16 DIAGNOSIS — Z79890 Hormone replacement therapy (postmenopausal): Secondary | ICD-10-CM

## 2021-12-16 DIAGNOSIS — Z9049 Acquired absence of other specified parts of digestive tract: Secondary | ICD-10-CM

## 2021-12-16 DIAGNOSIS — E7800 Pure hypercholesterolemia, unspecified: Secondary | ICD-10-CM | POA: Diagnosis present

## 2021-12-16 DIAGNOSIS — J9600 Acute respiratory failure, unspecified whether with hypoxia or hypercapnia: Secondary | ICD-10-CM | POA: Diagnosis present

## 2021-12-16 DIAGNOSIS — E039 Hypothyroidism, unspecified: Secondary | ICD-10-CM | POA: Diagnosis present

## 2021-12-16 DIAGNOSIS — I5020 Unspecified systolic (congestive) heart failure: Secondary | ICD-10-CM

## 2021-12-16 DIAGNOSIS — I5023 Acute on chronic systolic (congestive) heart failure: Secondary | ICD-10-CM | POA: Diagnosis not present

## 2021-12-16 LAB — BASIC METABOLIC PANEL
Anion gap: 9 (ref 5–15)
BUN: 34 mg/dL — ABNORMAL HIGH (ref 8–23)
CO2: 24 mmol/L (ref 22–32)
Calcium: 8.8 mg/dL — ABNORMAL LOW (ref 8.9–10.3)
Chloride: 105 mmol/L (ref 98–111)
Creatinine, Ser: 1.81 mg/dL — ABNORMAL HIGH (ref 0.44–1.00)
GFR, Estimated: 27 mL/min — ABNORMAL LOW (ref >60)
Glucose, Bld: 90 mg/dL (ref 70–99)
Potassium: 4.1 mmol/L (ref 3.5–5.1)
Sodium: 138 mmol/L (ref 135–145)

## 2021-12-16 LAB — RESP PANEL BY RT-PCR (FLU A&B, COVID) ARPGX2
Influenza A by PCR: NEGATIVE
Influenza B by PCR: NEGATIVE
SARS Coronavirus 2 by RT PCR: NEGATIVE

## 2021-12-16 LAB — ECHOCARDIOGRAM COMPLETE
AR max vel: 0.85 cm2
AV Area VTI: 0.69 cm2
AV Area mean vel: 0.79 cm2
AV Mean grad: 29 mmHg
AV Peak grad: 46.4 mmHg
Ao pk vel: 3.41 m/s
Area-P 1/2: 5.44 cm2
Calc EF: 31.9 %
Height: 63 in
P 1/2 time: 244 msec
S' Lateral: 3.5 cm
Single Plane A2C EF: 38.2 %
Single Plane A4C EF: 26.1 %
Weight: 2486.79 oz

## 2021-12-16 LAB — TROPONIN I (HIGH SENSITIVITY)
Troponin I (High Sensitivity): 19 ng/L — ABNORMAL HIGH (ref <18)
Troponin I (High Sensitivity): 104 ng/L (ref <18)
Troponin I (High Sensitivity): 1681 ng/L (ref <18)
Troponin I (High Sensitivity): 2256 ng/L (ref <18)
Troponin I (High Sensitivity): 2959 ng/L (ref <18)

## 2021-12-16 LAB — COMPREHENSIVE METABOLIC PANEL
ALT: 14 U/L (ref 0–44)
AST: 25 U/L (ref 15–41)
Albumin: 3.9 g/dL (ref 3.5–5.0)
Alkaline Phosphatase: 67 U/L (ref 38–126)
Anion gap: 9 (ref 5–15)
BUN: 33 mg/dL — ABNORMAL HIGH (ref 8–23)
CO2: 22 mmol/L (ref 22–32)
Calcium: 9 mg/dL (ref 8.9–10.3)
Chloride: 111 mmol/L (ref 98–111)
Creatinine, Ser: 1.66 mg/dL — ABNORMAL HIGH (ref 0.44–1.00)
GFR, Estimated: 30 mL/min — ABNORMAL LOW (ref >60)
Glucose, Bld: 254 mg/dL — ABNORMAL HIGH (ref 70–99)
Potassium: 4 mmol/L (ref 3.5–5.1)
Sodium: 142 mmol/L (ref 135–145)
Total Bilirubin: 0.7 mg/dL (ref 0.3–1.2)
Total Protein: 7.1 g/dL (ref 6.5–8.1)

## 2021-12-16 LAB — GLUCOSE, CAPILLARY
Glucose-Capillary: 172 mg/dL — ABNORMAL HIGH (ref 70–99)
Glucose-Capillary: 159 mg/dL — ABNORMAL HIGH (ref 70–99)
Glucose-Capillary: 74 mg/dL (ref 70–99)
Glucose-Capillary: 84 mg/dL (ref 70–99)
Glucose-Capillary: 110 mg/dL — ABNORMAL HIGH (ref 70–99)
Glucose-Capillary: 106 mg/dL — ABNORMAL HIGH (ref 70–99)

## 2021-12-16 LAB — MAGNESIUM
Magnesium: 2.2 mg/dL (ref 1.7–2.4)
Magnesium: 2.1 mg/dL (ref 1.7–2.4)
Magnesium: 2 mg/dL (ref 1.7–2.4)

## 2021-12-16 LAB — CBC
HCT: 30.4 % — ABNORMAL LOW (ref 36.0–46.0)
Hemoglobin: 9.7 g/dL — ABNORMAL LOW (ref 12.0–15.0)
MCH: 33 pg (ref 26.0–34.0)
MCHC: 31.9 g/dL (ref 30.0–36.0)
MCV: 103.4 fL — ABNORMAL HIGH (ref 80.0–100.0)
Platelets: 199 10*3/uL (ref 150–400)
RBC: 2.94 MIL/uL — ABNORMAL LOW (ref 3.87–5.11)
RDW: 14.3 % (ref 11.5–15.5)
WBC: 7.6 10*3/uL (ref 4.0–10.5)
nRBC: 0 % (ref 0.0–0.2)

## 2021-12-16 LAB — PHOSPHORUS
Phosphorus: 3.5 mg/dL (ref 2.5–4.6)
Phosphorus: 2.8 mg/dL (ref 2.5–4.6)

## 2021-12-16 LAB — BLOOD GAS, VENOUS
Acid-base deficit: 5.6 mmol/L — ABNORMAL HIGH (ref 0.0–2.0)
Bicarbonate: 21.9 mmol/L (ref 20.0–28.0)
O2 Saturation: 53.2 %
Patient temperature: 37
pCO2, Ven: 50 mmHg (ref 44–60)
pH, Ven: 7.25 (ref 7.25–7.43)
pO2, Ven: 35 mmHg (ref 32–45)

## 2021-12-16 LAB — POTASSIUM: Potassium: 4.4 mmol/L (ref 3.5–5.1)

## 2021-12-16 LAB — PROTIME-INR
INR: 1.1 (ref 0.8–1.2)
Prothrombin Time: 14.1 seconds (ref 11.4–15.2)

## 2021-12-16 LAB — PROCALCITONIN: Procalcitonin: <0.1 ng/mL

## 2021-12-16 LAB — APTT: aPTT: 29 seconds (ref 24–36)

## 2021-12-16 LAB — MRSA NEXT GEN BY PCR, NASAL: MRSA by PCR Next Gen: NOT DETECTED

## 2021-12-16 LAB — BRAIN NATRIURETIC PEPTIDE: B Natriuretic Peptide: 1055.5 pg/mL — ABNORMAL HIGH (ref 0.0–100.0)

## 2021-12-16 LAB — HEPARIN LEVEL (UNFRACTIONATED): Heparin Unfractionated: 0.52 IU/mL (ref 0.30–0.70)

## 2021-12-16 LAB — TSH: TSH: 18.474 u[IU]/mL — ABNORMAL HIGH (ref 0.350–4.500)

## 2021-12-16 LAB — T4, FREE: Free T4: 0.89 ng/dL (ref 0.61–1.12)

## 2021-12-16 MED ORDER — CHLORHEXIDINE GLUCONATE CLOTH 2 % EX PADS
6.0000 | MEDICATED_PAD | Freq: Every day | CUTANEOUS | Status: DC
  Administered 2021-12-17 – 2021-12-18 (×3): 6 via TOPICAL

## 2021-12-16 MED ORDER — FENTANYL CITRATE PF 50 MCG/ML IJ SOSY
50.0000 ug | PREFILLED_SYRINGE | Freq: Once | INTRAMUSCULAR | Status: AC
  Administered 2021-12-16: 50 ug via INTRAVENOUS
  Filled 2021-12-16: qty 1

## 2021-12-16 MED ORDER — DICLOFENAC SODIUM 1 % EX GEL
2.0000 g | Freq: Four times a day (QID) | CUTANEOUS | Status: DC | PRN
  Administered 2021-12-17 – 2021-12-22 (×5): 2 g via TOPICAL
  Filled 2021-12-16 (×2): qty 100

## 2021-12-16 MED ORDER — ONDANSETRON HCL 4 MG/2ML IJ SOLN
INTRAMUSCULAR | Status: AC
  Administered 2021-12-16: 4 mg via INTRAVENOUS
  Filled 2021-12-16: qty 2

## 2021-12-16 MED ORDER — LEVOTHYROXINE SODIUM 50 MCG PO TABS
75.0000 ug | ORAL_TABLET | Freq: Every day | ORAL | Status: DC
  Administered 2021-12-16 – 2021-12-23 (×8): 75 ug via ORAL
  Filled 2021-12-16 (×5): qty 1
  Filled 2021-12-16 (×3): qty 2

## 2021-12-16 MED ORDER — FUROSEMIDE 10 MG/ML IJ SOLN
60.0000 mg | Freq: Once | INTRAMUSCULAR | Status: AC
  Administered 2021-12-16: 60 mg via INTRAVENOUS
  Filled 2021-12-16: qty 8

## 2021-12-16 MED ORDER — LORAZEPAM 2 MG/ML IJ SOLN
0.5000 mg | Freq: Once | INTRAMUSCULAR | Status: AC
  Administered 2021-12-16: 0.5 mg via INTRAVENOUS
  Filled 2021-12-16: qty 1

## 2021-12-16 MED ORDER — HYDRALAZINE HCL 20 MG/ML IJ SOLN
10.0000 mg | Freq: Once | INTRAMUSCULAR | Status: AC
  Administered 2021-12-16: 10 mg via INTRAVENOUS
  Filled 2021-12-16: qty 1

## 2021-12-16 MED ORDER — METOPROLOL SUCCINATE ER 50 MG PO TB24
25.0000 mg | ORAL_TABLET | Freq: Every day | ORAL | Status: DC
  Administered 2021-12-16: 25 mg via ORAL
  Filled 2021-12-16: qty 1

## 2021-12-16 MED ORDER — HEPARIN BOLUS VIA INFUSION
4000.0000 [IU] | Freq: Once | INTRAVENOUS | Status: AC
  Administered 2021-12-16: 4000 [IU] via INTRAVENOUS
  Filled 2021-12-16: qty 4000

## 2021-12-16 MED ORDER — DOCUSATE SODIUM 100 MG PO CAPS
100.0000 mg | ORAL_CAPSULE | Freq: Two times a day (BID) | ORAL | Status: DC | PRN
  Administered 2021-12-20: 100 mg via ORAL
  Filled 2021-12-16: qty 1

## 2021-12-16 MED ORDER — HEPARIN SODIUM (PORCINE) 5000 UNIT/ML IJ SOLN
5000.0000 [IU] | Freq: Three times a day (TID) | INTRAMUSCULAR | Status: DC
  Administered 2021-12-16: 5000 [IU] via SUBCUTANEOUS
  Filled 2021-12-16: qty 1

## 2021-12-16 MED ORDER — PANTOPRAZOLE SODIUM 40 MG IV SOLR
40.0000 mg | INTRAVENOUS | Status: DC
  Administered 2021-12-16 – 2021-12-20 (×5): 40 mg via INTRAVENOUS
  Filled 2021-12-16 (×5): qty 10

## 2021-12-16 MED ORDER — EZETIMIBE 10 MG PO TABS
10.0000 mg | ORAL_TABLET | Freq: Every morning | ORAL | Status: DC
  Administered 2021-12-16 – 2021-12-23 (×8): 10 mg via ORAL
  Filled 2021-12-16 (×9): qty 1

## 2021-12-16 MED ORDER — POLYETHYLENE GLYCOL 3350 17 G PO PACK: 17.0000 g | PACK | Freq: Every day | ORAL | Status: DC | PRN

## 2021-12-16 MED ORDER — ONDANSETRON HCL 4 MG/2ML IJ SOLN: 4.0000 mg | Freq: Once | INTRAMUSCULAR | Status: AC

## 2021-12-16 MED ORDER — ONDANSETRON HCL 4 MG/2ML IJ SOLN
4.0000 mg | Freq: Once | INTRAMUSCULAR | Status: AC
  Administered 2021-12-16: 4 mg via INTRAVENOUS
  Filled 2021-12-16: qty 2

## 2021-12-16 MED ORDER — ORAL CARE MOUTH RINSE: 15.0000 mL | OROMUCOSAL | Status: DC | PRN

## 2021-12-16 MED ORDER — HEPARIN (PORCINE) 25000 UT/250ML-% IV SOLN
850.0000 [IU]/h | INTRAVENOUS | Status: DC
  Administered 2021-12-16 – 2021-12-19 (×3): 850 [IU]/h via INTRAVENOUS
  Filled 2021-12-16 (×3): qty 250

## 2021-12-16 MED ORDER — ASPIRIN 81 MG PO CHEW
324.0000 mg | CHEWABLE_TABLET | Freq: Once | ORAL | Status: AC
  Administered 2021-12-16: 324 mg via ORAL
  Filled 2021-12-16: qty 4

## 2021-12-16 MED ORDER — FUROSEMIDE 10 MG/ML IJ SOLN
40.0000 mg | Freq: Two times a day (BID) | INTRAMUSCULAR | Status: AC
  Administered 2021-12-16 – 2021-12-17 (×2): 40 mg via INTRAVENOUS
  Filled 2021-12-16 (×2): qty 4

## 2021-12-16 MED ORDER — INSULIN ASPART 100 UNIT/ML IJ SOLN
0.0000 [IU] | INTRAMUSCULAR | Status: DC
  Administered 2021-12-16: 3 [IU] via SUBCUTANEOUS
  Administered 2021-12-17: 2 [IU] via SUBCUTANEOUS
  Administered 2021-12-17: 3 [IU] via SUBCUTANEOUS
  Administered 2021-12-18 (×2): 2 [IU] via SUBCUTANEOUS
  Administered 2021-12-18: 8 [IU] via SUBCUTANEOUS
  Administered 2021-12-19 – 2021-12-20 (×2): 2 [IU] via SUBCUTANEOUS
  Filled 2021-12-16 (×9): qty 1

## 2021-12-16 MED ORDER — ISOSORBIDE MONONITRATE ER 30 MG PO TB24
15.0000 mg | ORAL_TABLET | Freq: Every day | ORAL | Status: DC
  Administered 2021-12-16: 15 mg via ORAL
  Filled 2021-12-16: qty 1

## 2021-12-16 MED ORDER — NITROGLYCERIN 2 % TD OINT
1.0000 [in_us] | TOPICAL_OINTMENT | Freq: Once | TRANSDERMAL | Status: AC
  Administered 2021-12-16: 1 [in_us] via TOPICAL
  Filled 2021-12-16: qty 1

## 2021-12-16 NOTE — Progress Notes (Signed)
Pt transported to ICU 11. During transport pt became nauseous, removed pt from Bipap, pt Spo2 decreased and was placed on NRB. Pt remains stable on NRB with Bipap at bedside. Report given to ICU RT.

## 2021-12-16 NOTE — Consult Note (Signed)
ANTICOAGULATION CONSULT NOTE - Follow Up Consult  Pharmacy Consult for heparin gtt Indication: chest pain/ACS  Allergies  Allergen Reactions   Codeine Other (See Comments)    Headaches Heart palpations   Other     Wool and cashmere    Statins     Muscle pain, joint pain   Adhesive [Tape] Rash    FOR  LONG PERIODS OF TIME FOR  LONG PERIODS OF TIME    Patient Measurements: Height: '5\' 3"'$  (160 cm) Weight: 70.5 kg (155 lb 6.8 oz) IBW/kg (Calculated) : 52.4 Heparin Dosing Weight: 67kg  Vital Signs: Temp: 98.7 F (37.1 C) (11/21 2000) Temp Source: Oral (11/21 2000) BP: 132/64 (11/21 2200) Pulse Rate: 100 (11/21 2200)  Labs: Recent Labs    12/16/21 0419 12/16/21 0620 12/16/21 1117 12/16/21 1256 12/16/21 1336 12/16/21 1553 12/16/21 2211  HGB 9.7*  --   --   --   --   --   --   HCT 30.4*  --   --   --   --   --   --   PLT 199  --   --   --   --   --   --   APTT  --   --   --   --  29  --   --   LABPROT  --   --   --   --  14.1  --   --   INR  --   --   --   --  1.1  --   --   HEPARINUNFRC  --   --   --   --   --   --  0.52  CREATININE 1.66*  --   --   --   --  1.81*  --   TROPONINIHS 19*   < > 1,681* 2,256*  --  2,959*  --    < > = values in this interval not displayed.    Estimated Creatinine Clearance: 22.2 mL/min (A) (by C-G formula based on SCr of 1.81 mg/dL (H)). Heparin Dosing Weight: 67kg  Medications:  PTA: No AC PTA Inpatient: HSQ >> heparin gtt  Assessment: 80yo F w/ h/o T2DM (A1c 6.2%), HTN, hypothyroid, HLD, LBBB, CKD3b (BL Scr 1.6-2) Colon & renal CA, & MDD presenting to ED with c/o CP & dyspnea x2-3days admitted to CCU with development of acute hypoxic respiratory failure ISO new onset Acute CHF vs hypertensive urgency with flash pulmonary edema. Trending tropinemia with interval increase of [id number]; Pharmacy consulted for the mgmt of heparin gtt w/ evolving c/f NSTEMI.   Date Time aPTT/HL Rate/Comment       Baseline Labs: aPTT - pending; INR -  pending Hgb - 9.7; Plts - 199 Trop - 010>9323  Goal of Therapy:  Heparin level 0.3-0.7 units/ml Monitor platelets by anticoagulation protocol: Yes   Plan:  11/21:  HL @ 2211 = 0.52, therapeutic X 1  Will continue pt on current rate and recheck HL on 11/22 @ 0600.   Bryona Foxworthy D 12/16/2021,11:32 PM

## 2021-12-16 NOTE — ED Triage Notes (Signed)
Pt arrives via [facility] from home with CC of CP x3 days and difficulty breathing. On scene SpO2 86% on room air. Pt arrives on CPAP with SpO2 of 95% labored breathing and abdominal muscle use. MD at bedside.

## 2021-12-16 NOTE — Consult Note (Signed)
ANTICOAGULATION CONSULT NOTE - Follow Up Consult  Pharmacy Consult for heparin gtt Indication: chest pain/ACS  Allergies  Allergen Reactions   Codeine Other (See Comments)    Headaches Heart palpations   Other     Wool and cashmere    Statins     Muscle pain, joint pain   Adhesive [Tape] Rash    FOR  LONG PERIODS OF TIME FOR  LONG PERIODS OF TIME    Patient Measurements: Height: '5\' 3"'$  (160 cm) Weight: 70.5 kg (155 lb 6.8 oz) IBW/kg (Calculated) : 52.4 Heparin Dosing Weight: 67kg  Vital Signs: Temp: 98.9 F (37.2 C) (11/21 1200) Temp Source: Oral (11/21 1200) BP: 135/62 (11/21 1200) Pulse Rate: 91 (11/21 1200)  Labs: Recent Labs    12/16/21 0419 12/16/21 0620 12/16/21 1117  HGB 9.7*  --   --   HCT 30.4*  --   --   PLT 199  --   --   CREATININE 1.66*  --   --   TROPONINIHS 19* 104* 1,681*   Estimated Creatinine Clearance: 24.2 mL/min (A) (by C-G formula based on SCr of 1.66 mg/dL (H)). Heparin Dosing Weight: 67kg  Medications:  PTA: No AC PTA Inpatient: HSQ >> heparin gtt  Assessment: [name] w/ h/o T2DM (A1c 6.2%), HTN, hypothyroid, HLD, LBBB, CKD3b (BL Scr 1.6-2) Colon & renal CA, & MDD presenting to ED with c/o CP & dyspnea x2-3days admitted to CCU with development of acute hypoxic respiratory failure ISO new onset Acute CHF vs hypertensive urgency with flash pulmonary edema. Trending tropinemia with interval increase of [id number]; Pharmacy consulted for the mgmt of heparin gtt w/ evolving c/f NSTEMI.   Date Time aPTT/HL Rate/Comment       Baseline Labs: aPTT - pending; INR - pending Hgb - 9.7; Plts - 199 Trop - 947>6546  Goal of Therapy:  Heparin level 0.3-0.7 units/ml Monitor platelets by anticoagulation protocol: Yes   Plan:  Give 4000 units bolus x1; then start heparin infusion at 850 units/hr Check anti-Xa level in 8 hours and daily once consecutively therapeutic. Continue to monitor H&H and platelets daily while on heparin gtt.  Tracy Silva  Tracy Silva 12/16/2021,1:18 PM

## 2021-12-16 NOTE — Progress Notes (Signed)
*  PRELIMINARY RESULTS* Echocardiogram 2D Echocardiogram has been performed.  Tracy Silva 12/16/2021, 2:20 PM

## 2021-12-16 NOTE — H&P (Addendum)
NAME:  Tracy Silva, MRN:  [id number], DOB:  Mar 11, 1940, LOS: 0 ADMISSION DATE:  12/16/2021, CONSULTATION DATE:  12/16/21 REFERRING MD:  Dr. Kerman Passey, CHIEF COMPLAINT:  Chest pain & SOB   History of Present Illness:  81 yo F presenting to [facility] ED from home via EMS with complaints of CP and dyspnea over the last 2-3 days. She reports intermittent CP with some dyspnea that she thought might be asthma related, this CP was experienced both at rest and with activity. She describes chest pain as 8/10 mid-sternal anterior pressure that is not reproducible. She denies abdominal pain/ nausea (*however was given Zofran x 2 in ED)/ vomiting/ diarrhea, she denies urinary symptoms, fever/chills (but is currently shivering requiring 7 blankets). She woke up suddenly with dyspnea in the middle of the night prompting her to call EMS. Upon EMS arrival SpO2 in the 80's on RA, placed on CPAP for transport.  ED course: Upon arrival patient is tachycardic, tachypneic, hypertensive & in respiratory distress. Patient placed on BIPAP support. Patient with diffuse rales on exam. Treated for hypertensive urgency, flash pulmonary edema, acute CHF exacerbation. Medications given: fentanyl, Lasix, hydralazine, ativan, nitro paste, zofran Initial Vitals: 97.5, 30, 120, 155/75 & 100% on BIPAP 70% Significant labs: (Labs/ Imaging personally reviewed) I, Domingo Pulse [place], AGACNP-BC, personally viewed and interpreted this ECG. EKG Interpretation: Date: 12/16/21, EKG Time: 04:10, Rate: 146, Rhythm: ST, QRS Axis:  LAD, Intervals: LBBB, ST/T Wave abnormalities: none, Narrative Interpretation: ST with LBBB (baseline LBBB) Chemistry: Na+: 142, K+: 4.0, BUN/Cr.: 33/1.66, Serum CO2/ AG: 22/9 Hematology: WBC:  7.6, Hgb: 9.7,  Troponin: 19, BNP: 1055, PCT: <0.10, COVID-19 & Influenza A/B: negative  ABG: 7.25/ 50/ 35/ 21.9 CXR 12/16/21: Widespread bilateral pulmonary interstitial opacity with no definite pleural fluid.  Consider Bilateral Pneumonia versus Pulmonary Edema. Viral/atypical infectious etiology possible. Combined disc- and pin-like metallic foreign body projects over the midline in the aortic arch region, etiology and significance unclear.  PCCM consulted for admission due to acute hypoxic respiratory failure requiring BIPAP with high risk for intubation.  Pertinent  Medical History  Chronic Anemia Hypothyroidism HTN Pure Hypercholesterolemia CKD stage 3b T2DM Aortic valve insufficiency Rectal Cancer  Significant Hospital Events: Including procedures, antibiotic start and stop dates in addition to other pertinent events   12/16/21: Admit to ICU with acute hypoxic  respiratory failure requiring BIPAP with high risk for intubation.  Interim History / Subjective:  Patient is alert and oriented on BIPAP with daughter bedside, able to converse in complete sentences but with ongoing chest pain and tachycardia.  Objective   Blood pressure 129/79, pulse (!) 140, temperature (!) 97.5 F (36.4 C), temperature source Axillary, resp. rate (!) 44, height '5\' 3"'$  (1.6 m), weight 70.5 kg, SpO2 100 %.       No intake or output data in the 24 hours ending 12/16/21 0619 Filed Weights   12/16/21 0426  Weight: 70.5 kg    Examination: General: Adult female, critically ill, sitting up in bed on BIPAP, NAD HEENT: MM pink/moist, anicteric, atraumatic, neck supple Neuro: A&O x 4, able to follow commands, PERRL +3, MAE CV: s1s2 RRR, ST with LBBB on monitor, no r/m/g Pulm: Regular, mildly labored on BIPAP 70%, breath sounds coarse crackles-BUL & crackles-BLL GI: soft, rounded, non tender, bs x 4 Skin: limited exam no rashes/lesions noted Extremities: warm/dry, pulses + 2 R/P, +1 edema noted BLE  Resolved Hospital Problem list     Assessment & Plan:  Acute Hypoxic Respiratory Failure  secondary to pulmonary edema secondary to suspected New onset Acute CHF exacerbation vs Hypertensive Urgency with flash  pulmonary edema PMHx: HTN, aortic valve insufficiency Patient recently taken off of all her anti-hypertensive medications, she reports her BP as reading "low" at home still when she checks it. She is unsure the exact number. Patient received 60 mg of lasix. - Continue BIPAP, wean FiO2 as tolerated - Supplemental O2 to maintain SpO2 > 90% - Intermittent chest x-ray & ABG PRN - Ensure adequate pulmonary hygiene  - PCT negative, no leukocytosis will hold off on antibiotics at this time - Bronchodilators PRN - Echocardiogram ordered - Continuous cardiac monitoring - Daily weights to assess volume status, utilize lasix PRN as hemodynamics and renal fxn allow - continue outpatient metoprolol once able to take PO medications - continue nitro paste until f/u troponin results - trend Troponin - consult cardiology PRN  CKD Stage 3b Baseline Cr: 1.6-2 - Strict I/O's: alert provider if UOP < 0.5 mL/kg/hr - Daily BMP, replace electrolytes PRN - Avoid nephrotoxic agents as able, ensure adequate renal perfusion  Type 2 Diabetes Mellitus Hemoglobin A1C: 6.2 (09/2021) - Monitor CBG Q 4 hours - SSI moderate dosing - target range while in ICU: 140-180 - follow ICU hyper/hypo-glycemia protocol  Hypothyroidism - continue outpatient synthroid once taking PO medications - TSH pending  Hypercholesteremia - continue outpatient zetia once able to tolerate PO medications  Best Practice (right click and "Reselect all SmartList Selections" daily)  Diet/type: NPO DVT prophylaxis: prophylactic heparin  GI prophylaxis: PPI Lines: N/A Foley:  Yes, and it is still needed Code Status:  full code Last date of multidisciplinary goals of care discussion [12/16/21]  Labs   CBC: Recent Labs  Lab 12/16/21 0419  WBC 7.6  HGB 9.7*  HCT 30.4*  MCV 103.4*  PLT 790    Basic Metabolic Panel: Recent Labs  Lab 12/16/21 0419  NA 142  K 4.0  CL 111  CO2 22  GLUCOSE 254*  BUN 33*  CREATININE 1.66*   CALCIUM 9.0   GFR: Estimated Creatinine Clearance: 24.2 mL/min (A) (by C-G formula based on SCr of 1.66 mg/dL (H)). Recent Labs  Lab 12/16/21 0419  PROCALCITON <0.10  WBC 7.6    Liver Function Tests: Recent Labs  Lab 12/16/21 0419  AST 25  ALT 14  ALKPHOS 67  BILITOT 0.7  PROT 7.1  ALBUMIN 3.9   No results for input(s): "LIPASE", "AMYLASE" in the last 168 hours. No results for input(s): "AMMONIA" in the last 168 hours.  ABG    Component Value Date/Time   HCO3 21.9 12/16/2021 0425   ACIDBASEDEF 5.6 (H) 12/16/2021 0425   O2SAT 53.2 12/16/2021 0425     Coagulation Profile: No results for input(s): "INR", "PROTIME" in the last 168 hours.  Cardiac Enzymes: No results for input(s): "CKTOTAL", "CKMB", "CKMBINDEX", "TROPONINI" in the last 168 hours.  HbA1C: Hgb A1c MFr Bld  Date/Time Value Ref Range Status  09/30/2021 09:30 AM 6.2 (H) 4.8 - 5.6 % Final    Comment:             Prediabetes: 5.7 - 6.4          Diabetes: >6.4          Glycemic control for adults with diabetes: <7.0   09/09/2020 10:48 AM 6.0 (H) 4.8 - 5.6 % Final    Comment:             Prediabetes: 5.7 - 6.4  Diabetes: >6.4          Glycemic control for adults with diabetes: <7.0     CBG: No results for input(s): "GLUCAP" in the last 168 hours.  Review of Systems: positives in BOLD  Gen: Denies fever, chills, weight change, fatigue, night sweats HEENT: Denies blurred vision, double vision, hearing loss, tinnitus, sinus congestion, rhinorrhea, sore throat, neck stiffness, dysphagia PULM: Denies shortness of breath, cough, sputum production, hemoptysis, wheezing CV: Denies chest pain, edema, orthopnea, paroxysmal nocturnal dyspnea, palpitations GI: Denies abdominal pain, nausea, vomiting, diarrhea, hematochezia, melena, constipation, change in bowel habits GU: Denies dysuria, hematuria, polyuria, oliguria, urethral discharge Endocrine: Denies hot or cold intolerance, polyuria, polyphagia  or appetite change Derm: Denies rash, dry skin, scaling or peeling skin change Heme: Denies easy bruising, bleeding, bleeding gums Neuro: Denies headache, numbness, weakness, slurred speech, loss of memory or consciousness  Past Medical History:  She,  has a past medical history of Acid reflux, Anemia, Arthritis, Asthma, Diabetes mellitus without complication ([place]), Heart murmur, High cholesterol, Hypertension, Hypothyroidism, Other and unspecified noninfectious gastroenteritis and colitis(558.9) (06/27/2012), Rectal bleeding (2011), and Rectal cancer ([place]) (07/16/2016).   Surgical History:   Past Surgical History:  Procedure Laterality Date   COLONOSCOPY  2011   X2   COLONOSCOPY N/A 07/27/2016   Procedure: COLONOSCOPY IN O.R;  Surgeon: Robert Bellow, MD;  Location: [facility] ORS;  Service: General;  Laterality: N/A;   IUD REMOVAL     LAPAROSCOPIC PARTIAL COLECTOMY Left 08/21/2016   Procedure: LAPAROSCOPIC ASSISTED LEFT HEMI-COLECTOMY;  Surgeon: Robert Bellow, MD;  Location: [facility] ORS;  Service: General;  Laterality: Left;   RECTAL EXAM UNDER ANESTHESIA N/A 07/27/2016   Procedure: RECTAL EXAM UNDER ANESTHESIA;  Surgeon: Robert Bellow, MD;  Location: [facility] ORS;  Service: General;  Laterality: N/A;   TONSILLECTOMY     TOOTH EXTRACTION     TRANSANAL EXCISION OF RECTAL MASS  07/27/2016   Procedure: EXCISION OF RECTAL MASS;  Surgeon: Robert Bellow, MD;  Location: [facility] ORS;  Service: General;;   WISDOM TOOTH EXTRACTION       Social History:   reports that she quit smoking about 50 years ago. Her smoking use included cigarettes. She has a 2.00 pack-year smoking history. She has never used smokeless tobacco. She reports current alcohol use. She reports that she does not use drugs.   Family History:  Her family history includes Breast cancer in her paternal aunt; Cancer in her maternal aunt; Pancreatic cancer in her mother.   Allergies Allergies  Allergen Reactions   Codeine Other  (See Comments)    Headaches Heart palpations   Other     Wool and cashmere    Statins     Muscle pain, joint pain   Adhesive [Tape] Rash    FOR  LONG PERIODS OF TIME FOR  LONG PERIODS OF TIME     Home Medications  Prior to Admission medications   Medication Sig Start Date End Date Taking? Authorizing Provider  ezetimibe (ZETIA) 10 MG tablet TAKE 1 TABLET BY MOUTH EVERY MORNING 04/28/21   Jerrol Banana., MD  levothyroxine (SYNTHROID) 75 MCG tablet TAKE ONE TABLET ON AN EMPTY STOMACH WITHA GLASS OF WATER AT LEAST 30 TO 60 MINUTES BEFORE BREAKFAST 09/02/21   Jerrol Banana., MD  metoprolol succinate (TOPROL-XL) 25 MG 24 hr tablet Take 1 tablet (25 mg total) by mouth daily. 12/03/21   Simmons-Robinson, Riki Sheer, MD  Multiple Vitamins-Calcium (ONE-A-DAY WOMENS PO) Take by  mouth daily.    [provider]  omeprazole (PRILOSEC OTC) 20 MG tablet Take 20 mg by mouth daily as needed.    [provider]     Critical care time: 60 minutes       Venetia Night, AGACNP-BC Acute Care Nurse Practitioner [facility]   [phone] / [phone] Please see Amion for pager details.

## 2021-12-16 NOTE — Consult Note (Signed)
[name] NOTE       Patient ID: Tracy Silva MRN: [id number] DOB/AGE: [birth year] 83 y.o.  Admit date: 12/16/2021 Referring Physician Donell Beers, NP  Primary Physician Dr. Alba Cory Primary Cardiologist Dr. Saralyn Pilar Reason for Consultation SOB query new CHF vs htn urgency  HPI: Tracy Silva is an 33yoF with a PMH of HTN, DM2, chronic LBBB, hypothyroidism, hx rectal cancer s/p colectomy (2018) who presented to [facility] ED 12/16/2021 in the early morning hours in respiratory distress requiring BiPAP in the emergency department.  Cardiology is consulted for further assistance characterization of the etiology of her shortness of breath, query new CHF versus hypertensive urgency.  She presents with her daughter who contributes to the history.  She was seen by her PCP on 11/8 where several antihypertensives were discontinued and her metoprolol was decreased from 50 mg once daily to 25 mg once daily due to low blood pressures at home (reportedly 98 systolic per patient).  She was taken off of HCTZ 12.5 mg once daily, amlodipine-benazepril 5-10 mg.  The patient notes over the past 2 weeks she was having worsening dyspnea on exertion and exertional chest pain she just describes as "pressing" in the center of her chest without radiation, nausea, vomiting, and resolving after periods of rest.  This occurs primarily when she has been Christmas shopping or grocery shopping.  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  She was hypertensive on presentation with blood pressure 176/96, and was tachycardic with rates in the 130s-140s.  She was given Nitropaste, a dose of 60 mg of IV Lasix and IV  hydralazine x1 with improvement in her blood pressure and work of breathing.  In my time of evaluation she feels much better than when she came in, has been weaned to 6 L HFNC, she is normotensive with blood pressure 120/81.  Her EKG this afternoon shows sinus rhythm with a chronic LBBB without acute ischemic changes.  She currently denies any chest discomfort, no palpitations, dizziness or heart racing.  She has chronic dependent peripheral edema that resolves with elevation of her lower legs.  She has not noticed any increase in her weight recently, has intentionally lost 16 pounds over the past 6 months with changing her diet.  She was seen once by Dr. Saralyn Pilar in 2018 for a preoperative evaluation before colonoscopy, presumably due to an abnormal EKG (LBBB) for which he recommended an echo to be performed.  She does not ever recall getting that done and I cannot find any record of this.  She denies prior history of stress test or heart catheterization.   Review of systems complete and found to be negative unless listed above     Past Medical History:  Diagnosis Date   Acid reflux    Anemia    H/O AS A CHILD   Arthritis    FINGERS AND SHOULDER-RIGHT   Asthma    AS A CHILD   Diabetes mellitus without complication (HCC)    Heart murmur    ASYMPTOMATIC   High cholesterol    Hypertension    Hypothyroidism    Other and unspecified noninfectious gastroenteritis and colitis(558.9) 06/27/2012   Rectal  bleeding 2011   Rectal cancer ([place]) 07/16/2016    Past Surgical History:  Procedure Laterality Date   COLONOSCOPY  2011   X2   COLONOSCOPY N/A 07/27/2016   Procedure: COLONOSCOPY IN O.R;  Surgeon: Robert Bellow, MD;  Location: [facility] ORS;  Service: General;  Laterality: N/A;   IUD REMOVAL     LAPAROSCOPIC PARTIAL COLECTOMY Left 08/21/2016   Procedure: LAPAROSCOPIC ASSISTED LEFT HEMI-COLECTOMY;  Surgeon: Robert Bellow, MD;  Location: [facility] ORS;  Service: General;  Laterality: Left;    RECTAL EXAM UNDER ANESTHESIA N/A 07/27/2016   Procedure: RECTAL EXAM UNDER ANESTHESIA;  Surgeon: Robert Bellow, MD;  Location: [facility] ORS;  Service: General;  Laterality: N/A;   TONSILLECTOMY     TOOTH EXTRACTION     TRANSANAL EXCISION OF RECTAL MASS  07/27/2016   Procedure: EXCISION OF RECTAL MASS;  Surgeon: Robert Bellow, MD;  Location: [facility] ORS;  Service: General;;   WISDOM TOOTH EXTRACTION      Medications Prior to Admission  Medication Sig Dispense Refill Last Dose   ezetimibe (ZETIA) 10 MG tablet TAKE 1 TABLET BY MOUTH EVERY MORNING 90 tablet 3    levothyroxine (SYNTHROID) 75 MCG tablet TAKE ONE TABLET ON AN EMPTY STOMACH WITHA GLASS OF WATER AT LEAST 30 TO 60 MINUTES BEFORE BREAKFAST 30 tablet 9    metoprolol succinate (TOPROL-XL) 25 MG 24 hr tablet Take 1 tablet (25 mg total) by mouth daily. 90 tablet 1    Multiple Vitamins-Calcium (ONE-A-DAY WOMENS PO) Take by mouth daily.      omeprazole (PRILOSEC OTC) 20 MG tablet Take 20 mg by mouth daily as needed.      Social History   Socioeconomic History   Marital status: Married    Spouse name: Not on file   Number of children: Not on file   Years of education: Not on file   Highest education level: Not on file  Occupational History   Not on file  Tobacco Use   Smoking status: Former    Packs/day: 0.50    Years: 4.00    Total pack years: 2.00    Types: Cigarettes    Quit date: 01/26/1971    Years since quitting: 50.9   Smokeless tobacco: Never  Vaping Use   Vaping Use: Never used  Substance and Sexual Activity   Alcohol use: Yes    Comment: occasionally with dinner   Drug use: No   Sexual activity: Not on file  Other Topics Concern   Not on file  Social History Narrative   Not on file   Social Determinants of Health   Financial Resource Strain: Not on file  Food Insecurity: Not on file  Transportation Needs: Not on file  Physical Activity: Not on file  Stress: Not on file  Social Connections: Not on file   Intimate Partner Violence: Not on file    Family History  Problem Relation Age of Onset   Pancreatic cancer Mother    Breast cancer Paternal Aunt    Cancer Maternal Aunt      Vitals:   12/16/21 0900 12/16/21 1000 12/16/21 1100 12/16/21 1200  BP: 126/66 136/66 (!) 140/68 135/62  Pulse: 88 81 95 91  Resp: '14 18 15 14  '$ Temp:    98.9 F (37.2 C)  TempSrc:    Oral  SpO2: 100% 100% 100% 100%  Weight:      Height:        PHYSICAL EXAM General: Pleasant elderly Caucasian  female, well nourished, in no acute distress.  Laying in incline in ICU bed, daughter at bedside. HEENT:  Normocephalic and atraumatic. Neck:  No JVD. R carotid bruit present Lungs: Normal respiratory effort on 6 L HFNC.  Bibasilar crackles.  Heart: HRRR . Normal S1 and soft S2.  3/6 systolic murmur best heard at the RUSB.  Abdomen: Non-distended appearing.  Msk: Normal strength and tone for age. Extremities: Warm and well perfused. No clubbing, cyanosis.  Generally edematous bilateral lower extremities with chronic venous stasis hyperpigmentation.   Neuro: Alert and oriented X 3. Psych:  Answers questions appropriately.   Labs: Basic Metabolic Panel: Recent Labs    12/16/21 0419 12/16/21 0620  NA 142  --   K 4.0  --   CL 111  --   CO2 22  --   GLUCOSE 254*  --   BUN 33*  --   CREATININE 1.66*  --   CALCIUM 9.0  --   MG  --  2.2  PHOS  --  3.5   Liver Function Tests: Recent Labs    12/16/21 0419  AST 25  ALT 14  ALKPHOS 67  BILITOT 0.7  PROT 7.1  ALBUMIN 3.9   No results for input(s): "LIPASE", "AMYLASE" in the last 72 hours. CBC: Recent Labs    12/16/21 0419  WBC 7.6  HGB 9.7*  HCT 30.4*  MCV 103.4*  PLT 199   Cardiac Enzymes: Recent Labs    12/16/21 0419 12/16/21 0620 12/16/21 1117  TROPONINIHS 19* 104* 1,681*   BNP: Recent Labs    12/16/21 0419  BNP 1,055.5*   D-Dimer: No results for input(s): "DDIMER" in the last 72 hours. Hemoglobin A1C: No results for input(s):  "HGBA1C" in the last 72 hours. Fasting Lipid Panel: No results for input(s): "CHOL", "HDL", "LDLCALC", "TRIG", "CHOLHDL", "LDLDIRECT" in the last 72 hours. Thyroid Function Tests: Recent Labs    12/16/21 0620  TSH 18.474*   Anemia Panel: No results for input(s): "VITAMINB12", "FOLATE", "FERRITIN", "TIBC", "IRON", "RETICCTPCT" in the last 72 hours.   Radiology: DG Chest Portable 1 View  Result Date: 12/16/2021 CLINICAL DATA:  81 year old female with chest pain for 3 days and shortness of breath. Retractions. EXAM: PORTABLE CHEST 1 VIEW COMPARISON:  CT Abdomen and Pelvis 07/22/2016. FINDINGS: Portable AP upright view at 0424 hours. Calcified aortic atherosclerosis. Cardiac size appears to remain normal. Indistinct appearance of the bilateral hila. Relatively large lung volumes with widespread bilateral pulmonary interstitial opacity, coarse in the perihilar regions. No superimposed pneumothorax or consolidation. No pleural effusion identified. Combined disc like and pin-like metallic foreign body projects over the midline in the aortic arch region partially obscuring the trachea. Etiology and significance is unclear. No acute osseous abnormality identified. Negative visible bowel gas. IMPRESSION: 1. Widespread bilateral pulmonary interstitial opacity with no definite pleural fluid. Consider Bilateral Pneumonia versus Pulmonary Edema. Viral/atypical infectious etiology possible. 2. Combined disc- and pin-like metallic foreign body projects over the midline in the aortic arch region, etiology and significance unclear. 3. Aortic Atherosclerosis (ICD10-I70.0). Electronically Signed   By: Genevie Ann M.D.   On: 12/16/2021 04:52    ECHO no prior available  TELEMETRY reviewed by me (LT) 12/16/2021 : Sinus rhythm to sinus tachycardia with a LBBB rate 79-1 04  EKG reviewed by me: NSR LBBB rate  Data reviewed by me (LT) 12/16/2021: last pcp note, admission H&P, ed note, cbc bmp bnp troponins ekgs tele vitals    Principal Problem:   Acute respiratory  failure ([place]) Active Problems:   Acute heart failure with preserved ejection fraction (HFpEF) (HCC)    ASSESSMENT AND PLAN:  Donata Duff is an 62yoF with a PMH of HTN, DM2, chronic LBBB, hypothyroidism, hx rectal cancer s/p colectomy (2018) who [facility] 12[facility]16/2021 in the early morning hours in respiratory distress requiring BiPAP in the emergency department.  Cardiology is consulted for further assistance characterization of the etiology of her shortness of breath, query new CHF versus hypertensive urgency.  # Acute hypoxic respiratory failure # Hypertensive urgency On 11/8 her PCP discontinued amlodipine-benazepril & HCTZ, and decreased her dose of metoprolol XL from 50 to 25 mg due to orthostatic hypotension.  She presents with a 2-week history of worsening dyspnea on exertion and exertional chest discomfort was hypoxic to the 80s on room air, requiring CPAP & BIPAP on admission. She was extremely hypertensive (176/96) and tachypneic with CXR demonstrating significant pulmonary edema.  She has been weaned from BiPAP to 6 L by HFNC by my time of evaluation this afternoon. -Continue to wean oxygen as tolerated, further plan as below  # systolic murmur query AS  # elevated BNP / probable CHF BNP elevated to 1055 with pulmonary edema on CXR and a significant O2 requirement with none at baseline. She has a soft s2 and systolic murmur on exam with radiation to the carotids, suspicious for aortic stenosis. Will obtain echo to further characterize the presence & severity of any valvular pathologies and her EF as she has no baseline echo. If her AS is severe, this could possibly be contributing to her exertional chest pain and worsening DOE - especially with discontinuation of her diuretic and other antihypertensives recently.  -echo complete performed this afternoon, read pending.  -s/p IV lasix '60mg'$  x1, ordered IV lasix '40mg'$  x 2 more  doses -continue metoprolol XL '25mg'$  once daily  -will consider excalation in GDMT as her BP and renal function allow (ACEi/ARB, MRA, Sglt2i)  -strict I/O, salt restriction   # elevated troponin # chronic LBBB Troponins elevated and uptrending from 19, 104, 1681, 2256 with exertional angina in several days prior to admission. EKG redemonstrates her chronic LBBB without other acute ischemic changes. Trop elevation concerning for NSTEMI with significant delta throughout the morning vs severe demand ischemia.  -s/p '325mg'$  ASA, continue '81mg'$  daily -start heparin infusion to continue for 48 hours -echo as above to eval for WMA -continue metoprolol -add imdur '15mg'$  daily -continue zetia '10mg'$ , reportedly intolerant of "statins" unclear which ones she has tried (will discuss this further)  -plan to treat conservatively from a cardiac standpoint since the patient's chest pain has resolved as her respiratory status improved.  # CKD 3  Monitor closely with diuresis, BUN/Cr 33/1.66 and GFR  30 today.   # hypothyroidism  TSH elevated at 18, T4 wnl.  -on synthroid 38mg daily, mgmt per primary    This patient's plan of care was discussed and created with Dr. KNehemiah Massedand he is in agreement.  Signed: LTristan Schroeder, PA-C 12/16/2021, 12:36 PM [facility]

## 2021-12-16 NOTE — Progress Notes (Signed)
OT Cancellation Note  Patient Details Name: Tracy Silva MRN: [id number] DOB: Aug 18, 1940   Cancelled Treatment:    Reason Eval/Treat Not Completed: Medical issues which prohibited therapy. Consult received, chart reviewed. Pt noted with up trending troponin levels and cardiology consult still pending. Will hold OT evaluation at this time per guidelines and re-attempt at later date/time as pt is appropriate.   Ardeth Perfect., MPH, MS, OTR/L ascom [phone] 12/16/21, 2:20 PM

## 2021-12-16 NOTE — Consult Note (Signed)
PHARMACY CONSULT NOTE - FOLLOW UP  Pharmacy Consult for Electrolyte Monitoring and Replacement   Recent Labs: Potassium (mmol/L)  Date Value  12/16/2021 4.0  07/01/2012 4.1   Magnesium (mg/dL)  Date Value  12/16/2021 2.2   Calcium (mg/dL)  Date Value  12/16/2021 9.0   Calcium, Total (mg/dL)  Date Value  07/01/2012 9.1   Albumin (g/dL)  Date Value  12/16/2021 3.9  09/30/2021 4.2   Phosphorus (mg/dL)  Date Value  12/16/2021 3.5   Sodium (mmol/L)  Date Value  12/16/2021 142  09/30/2021 144  07/01/2012 137     Assessment: [name] w/ h/o T2DM (A1c 6.2%), HTN, hypothyroid, HLD, LBBB, CKD3b (BL Scr 1.6-2) Colon & renal CA, & MDD presenting to ED with c/o CP & dyspnea x2-3days admitted to CCU with development of acute hypoxic respiratory failure ISO new onset Acute CHF vs hypertensive urgency with flash pulmonary edema. Pharmacy consulted for the mgmt of electrolytes.  Medications: Lasix '60mg'$  IV x1 (11/21)  Goal of Therapy:  Lytes WNL.   Plan:  Scr 1.66 (at baseline 1.6-2).  Currently lytes all WNL including add-on Mg/Phos. Will CTM ISO intermittent diuresis and new onset CHF w/u. Next Labs with 11/22 0500 set.  Lorna Dibble ,PharmD Clinical Pharmacist 12/16/2021 8:05 AM

## 2021-12-16 NOTE — Progress Notes (Signed)
PT Cancellation Note  Patient Details Name: Tracy Silva MRN: [id number] DOB: 1940-11-14   Cancelled Treatment:    Reason Eval/Treat Not Completed: Medical issues which prohibited therapy;Patient not medically ready (Per chart review HS troponin continues to rise. Per facility guidelines will defer PT evaluation until downtrending guidelines. Also of note- will wait until cardiology has assessed.)  2:15 PM, 12/16/21 Etta Grandchild, PT, DPT Physical Therapist - [facility]  [phone] ([place])    [place] C 12/16/2021, 2:15 PM

## 2021-12-16 NOTE — ED Provider Notes (Signed)
[facility] Provider Note    Event Date/Time   First MD Initiated Contact with Patient 12/16/21 [phone]     (approximate)  History   Chief Complaint: Respiratory Distress  HPI  Tracy Silva is a 81 y.o. female with a past medical history of anemia, asthma, diabetes, hypertension, hyperlipidemia, presents to the emergency department for chest pain and shortness of breath.  According to EMS report patient has been complaining of chest pain for the past 3 days with acute onset of shortness of breath last evening into tonight.  No history of CHF per patient.  Patient with diffuse rales on exam.  Patient brought in on CPAP they state initial room air saturations in the 80s, around 90% currently on CPAP.  Patient transition to BiPAP upon arrival.  Patient is fatigued appearing.  Physical Exam   Triage Vital Signs: ED Triage Vitals [12/16/21 0410]  Enc Vitals Group     BP      Pulse      Resp      Temp      Temp src      SpO2      Weight      Height      Head Circumference      Peak Flow      Pain Score 10     Pain Loc      Pain Edu?      Excl. in [place]?     Most recent vital signs: There were no vitals filed for this visit.  General: Fatigue, keeps her eyes closed but awakens to voice and answers questions appropriately CV:  Good peripheral perfusion.  Regular rhythm rate around 130 bpm. Resp:  Moderate tachypnea currently on BiPAP diffuse rales. Abd:  No distention.  Soft, nontender.  No rebound or guarding. Other:  Mild lower extreme edema bilaterally.   ED Results / Procedures / Treatments   EKG  EKG viewed and interpreted by myself shows what appears to be a regular rhythm at 146 bpm with a widened QRS normal axis prolonged QTc nonspecific ST changes with some ST depression laterally possibly consistent with demand ischemia.  I reviewed the patient's prior EKG which shows a left bundle branch block consistent with today's EKG.  No STEMI  criteria.  RADIOLOGY  I have reviewed and interpreted the chest x-ray images, appear to show opacities throughout both lungs most consistent with pulmonary edema. Radiology has read the x-ray is widespread bilateral interstitial opacities consistent with pneumonia versus edema.  MEDICATIONS ORDERED IN ED: Medications  nitroGLYCERIN (NITROGLYN) 2 % ointment 1 inch (has no administration in time range)  hydrALAZINE (APRESOLINE) injection 10 mg (has no administration in time range)  ondansetron (ZOFRAN) injection 4 mg (4 mg Intravenous Given 12/16/21 0415)     IMPRESSION / MDM / ASSESSMENT AND PLAN / ED COURSE  I reviewed the triage vital signs and the nursing notes.  Patient's presentation is most consistent with acute presentation with potential threat to life or bodily function.  Patient presents emergency department for acute onset of shortness of breath this evening with 3 days of chest pain.  Patient around 90% on CPAP in the 80s on room air per EMS.  Transition to BiPAP patient appears more comfortable on BiPAP.  Patient is tachycardic around 140 bpm wide-complex EKG but past history of left bundle branch block.  Patient found to be hypertensive currently 962 systolic we will dose nitroglycerin ointment as well as IV hydralazine  to reduce afterload given clinical picture consistent with pulmonary edema.  We will check labs including cardiac enzymes and a BNP.  Will obtain a chest x-ray and continue to closely monitor.  Differential would include ACS now with resultant CHF, pulmonary edema, pneumonia, COVID.  Patient's chest x-ray appears show pulmonary edema.  Patient's lab work shows significant BNP elevation.  Troponin is largely normal aside slight elevation.  Patient blood pressure is improving with hydralazine and nitroglycerin ointment.  Continues to be short of breath on BiPAP although satting 100% now which is a good improvement.  I spoke to the patient regarding intubation and  resuscitation, patient wishes to have both performed if absolutely necessary.  Patient CBC is normal with a normal white blood cell count.  Remainder the chemistry shows no significant abnormality patient has mild renal insufficiency.  However given the patient's continued tachycardia continued tachypnea continued dyspnea with x-ray concerning for pulmonary edema we will dose IV Lasix and I discussed the patient with the intensivist who will be admitting to their service.  CRITICAL CARE Performed by: Harvest Dark   Total critical care time: 45 minutes  Critical care time was exclusive of separately billable procedures and treating other patients.  Critical care was necessary to treat or prevent imminent or life-threatening deterioration.  Critical care was time spent personally by me on the following activities: development of treatment plan with patient and/or surrogate as well as nursing, discussions with consultants, evaluation of patient's response to treatment, examination of patient, obtaining history from patient or surrogate, ordering and performing treatments and interventions, ordering and review of laboratory studies, ordering and review of radiographic studies, pulse oximetry and re-evaluation of patient's condition.   FINAL CLINICAL IMPRESSION(S) / ED DIAGNOSES   Dyspnea Pulmonary edema  Note:  This document was prepared using Dragon voice recognition software and may include unintentional dictation errors.   Harvest Dark, MD 12/16/21 [phone]

## 2021-12-16 NOTE — ED Notes (Signed)
Receiving RN Romilda Joy has agreed to accept TOC once pt has arrived to inpatient unit (ICU), all questions and concerns address. Pt remains on cardiac monitor, RT to assist with Bipap and respiratory needs.

## 2021-12-17 DIAGNOSIS — J9601 Acute respiratory failure with hypoxia: Secondary | ICD-10-CM

## 2021-12-17 DIAGNOSIS — I214 Non-ST elevation (NSTEMI) myocardial infarction: Secondary | ICD-10-CM | POA: Diagnosis not present

## 2021-12-17 DIAGNOSIS — J810 Acute pulmonary edema: Secondary | ICD-10-CM | POA: Diagnosis not present

## 2021-12-17 LAB — PHOSPHORUS: Phosphorus: 4.5 mg/dL (ref 2.5–4.6)

## 2021-12-17 LAB — CBC
HCT: 27.8 % — ABNORMAL LOW (ref 36.0–46.0)
Hemoglobin: 9 g/dL — ABNORMAL LOW (ref 12.0–15.0)
MCH: 32.4 pg (ref 26.0–34.0)
MCHC: 32.4 g/dL (ref 30.0–36.0)
MCV: 100 fL (ref 80.0–100.0)
Platelets: 184 10*3/uL (ref 150–400)
RBC: 2.78 MIL/uL — ABNORMAL LOW (ref 3.87–5.11)
RDW: 14.2 % (ref 11.5–15.5)
WBC: 5.4 10*3/uL (ref 4.0–10.5)
nRBC: 0 % (ref 0.0–0.2)

## 2021-12-17 LAB — IRON AND TIBC
Iron: 56 ug/dL (ref 28–170)
Saturation Ratios: 18 % (ref 10.4–31.8)
TIBC: 314 ug/dL (ref 250–450)
UIBC: 258 ug/dL

## 2021-12-17 LAB — BASIC METABOLIC PANEL
Anion gap: 8 (ref 5–15)
BUN: 41 mg/dL — ABNORMAL HIGH (ref 8–23)
CO2: 26 mmol/L (ref 22–32)
Calcium: 9 mg/dL (ref 8.9–10.3)
Chloride: 103 mmol/L (ref 98–111)
Creatinine, Ser: 2.15 mg/dL — ABNORMAL HIGH (ref 0.44–1.00)
GFR, Estimated: 22 mL/min — ABNORMAL LOW (ref >60)
Glucose, Bld: 97 mg/dL (ref 70–99)
Potassium: 3.7 mmol/L (ref 3.5–5.1)
Sodium: 137 mmol/L (ref 135–145)

## 2021-12-17 LAB — GLUCOSE, CAPILLARY
Glucose-Capillary: 108 mg/dL — ABNORMAL HIGH (ref 70–99)
Glucose-Capillary: 99 mg/dL (ref 70–99)
Glucose-Capillary: 162 mg/dL — ABNORMAL HIGH (ref 70–99)
Glucose-Capillary: 111 mg/dL — ABNORMAL HIGH (ref 70–99)
Glucose-Capillary: 138 mg/dL — ABNORMAL HIGH (ref 70–99)

## 2021-12-17 LAB — T3, FREE: T3, Free: 1.8 pg/mL — ABNORMAL LOW (ref 2.0–4.4)

## 2021-12-17 LAB — PROCALCITONIN: Procalcitonin: 0.84 ng/mL

## 2021-12-17 LAB — TROPONIN I (HIGH SENSITIVITY): Troponin I (High Sensitivity): 1877 ng/L (ref <18)

## 2021-12-17 LAB — HEPARIN LEVEL (UNFRACTIONATED): Heparin Unfractionated: 0.57 IU/mL (ref 0.30–0.70)

## 2021-12-17 LAB — MAGNESIUM: Magnesium: 2 mg/dL (ref 1.7–2.4)

## 2021-12-17 MED ORDER — ISOSORBIDE MONONITRATE ER 30 MG PO TB24
15.0000 mg | ORAL_TABLET | Freq: Two times a day (BID) | ORAL | Status: DC
  Administered 2021-12-17 – 2021-12-19 (×5): 15 mg via ORAL
  Filled 2021-12-17 (×5): qty 1

## 2021-12-17 MED ORDER — ASPIRIN 81 MG PO CHEW
81.0000 mg | CHEWABLE_TABLET | Freq: Every day | ORAL | Status: DC
  Administered 2021-12-17 – 2021-12-23 (×7): 81 mg via ORAL
  Filled 2021-12-17 (×7): qty 1

## 2021-12-17 MED ORDER — METOPROLOL SUCCINATE ER 25 MG PO TB24
12.5000 mg | ORAL_TABLET | Freq: Every day | ORAL | Status: DC
  Administered 2021-12-17 – 2021-12-19 (×3): 12.5 mg via ORAL
  Filled 2021-12-17: qty 0.5
  Filled 2021-12-17: qty 1
  Filled 2021-12-17: qty 0.5
  Filled 2021-12-17: qty 1

## 2021-12-17 MED ORDER — CLOPIDOGREL BISULFATE 75 MG PO TABS
75.0000 mg | ORAL_TABLET | Freq: Every day | ORAL | Status: DC
  Administered 2021-12-18 – 2021-12-23 (×6): 75 mg via ORAL
  Filled 2021-12-17 (×6): qty 1

## 2021-12-17 MED ORDER — HYDRALAZINE HCL 10 MG PO TABS
10.0000 mg | ORAL_TABLET | Freq: Two times a day (BID) | ORAL | Status: DC
  Administered 2021-12-17 – 2021-12-23 (×12): 10 mg via ORAL
  Filled 2021-12-17 (×13): qty 1

## 2021-12-17 NOTE — Progress Notes (Signed)
PROGRESS NOTE    Tracy Silva  XAJ:[id number] DOB: February 06, 1940 DOA: 12/16/2021 PCP: Eulis Foster, MD    Assessment & Plan:   Principal Problem:   Acute respiratory failure ([place]) Active Problems:   Acute heart failure with preserved ejection fraction (HFpEF) ([place])  Assessment and Plan: Acute hypoxic respiratory failure: likely secondary to pulmonary edema, CHF. Weaned off of supplemental oxygen.  Possibly pulmonary edema: holding lasix today as per cardio. Monitor I/Os   Acute systolic CHF: holding lasix today as per cardio. Monitor I/Os. Continue on imdur, metoprolol    NSTEMI: w/ elevated troponins. Continue on IV heparin x 48 hrs. Continue on metoprolol, imdur, zetia (unable to tolerate statins). Will medically manage as per cardio. Cardio following and recs apprec   CKDIIIb: Cr is trending up from day prior. Avoid nephrotoxic meds   Likely ACD: likely secondary to CKD. No need for a transfusion currently   DM2: well controlled HbA1c 6.2 on 09/2021. Continue on SSI w/ accuchecks  Hypothyroidism: continue on home dose of synthroid  HLD: continue on zetia   Generalized weakness: PT recs HH      DVT prophylaxis: IV heparin  Code Status: full  Family Communication: discussed pt's care w/ pt's daughter at bedside and answered their questions Disposition Plan: likely d/c back home   Level of care: progressive  Status is: Inpatient Remains inpatient appropriate because: severity of illness    Consultants:  Cardio   Procedures:   Antimicrobials:    Subjective: Pt c/o malaise   Objective: Vitals:   12/17/21 1000 12/17/21 1100 12/17/21 1200 12/17/21 1300  BP: (!) 94/52 (!) 104/52 107/64 133/71  Pulse: (!) 101 93 (!) 105 95  Resp: (!) 24 (!) '22 19 20  '$ Temp:   97.9 F (36.6 C)   TempSrc:   Oral   SpO2: 93% 90% 97% 97%  Weight:      Height:        Intake/Output Summary (Last 24 hours) at 12/17/2021 1630 Last data filed at 12/17/2021  1400 Gross per 24 hour  Intake 536.37 ml  Output 2175 ml  Net -1638.63 ml   Filed Weights   12/16/21 0426  Weight: 70.5 kg    Examination:  General exam: Appears calm and comfortable  Respiratory system: decreased breath sounds b/l  Cardiovascular system: S1 & S2+. No rubs, gallops or clicks. B/l 2+ pitting LE edema  Gastrointestinal system: Abdomen is nondistended, soft and nontender. Normal bowel sounds heard. Central nervous system: Alert and oriented. Moves all extremities. Psychiatry: Judgement and insight appear normal. Flat mood and affect    Data Reviewed: I have personally reviewed following labs and imaging studies  CBC: Recent Labs  Lab 12/16/21 0419 12/17/21 0617  WBC 7.6 5.4  HGB 9.7* 9.0*  HCT 30.4* 27.8*  MCV 103.4* 100.0  PLT 199 094   Basic Metabolic Panel: Recent Labs  Lab 12/16/21 0419 12/16/21 0620 12/16/21 1311 12/16/21 1553 12/17/21 0617  NA 142  --   --  138 137  K 4.0  --  4.4 4.1 3.7  CL 111  --   --  105 103  CO2 22  --   --  24 26  GLUCOSE 254*  --   --  90 97  BUN 33*  --   --  34* 41*  CREATININE 1.66*  --   --  1.81* 2.15*  CALCIUM 9.0  --   --  8.8* 9.0  MG  --  2.2 2.1 2.0  2.0  PHOS  --  3.5 2.8  --  4.5   GFR: Estimated Creatinine Clearance: 18.7 mL/min (A) (by C-G formula based on SCr of 2.15 mg/dL (H)). Liver Function Tests: Recent Labs  Lab 12/16/21 0419  AST 25  ALT 14  ALKPHOS 67  BILITOT 0.7  PROT 7.1  ALBUMIN 3.9   No results for input(s): "LIPASE", "AMYLASE" in the last 168 hours. No results for input(s): "AMMONIA" in the last 168 hours. Coagulation Profile: Recent Labs  Lab 12/16/21 1336  INR 1.1   Cardiac Enzymes: No results for input(s): "CKTOTAL", "CKMB", "CKMBINDEX", "TROPONINI" in the last 168 hours. BNP (last 3 results) No results for input(s): "PROBNP" in the last 8760 hours. HbA1C: No results for input(s): "HGBA1C" in the last 72 hours. CBG: Recent Labs  Lab 12/16/21 2326  12/17/21 0427 12/17/21 0749 12/17/21 1115 12/17/21 1539  GLUCAP 106* 108* 99 162* 111*   Lipid Profile: No results for input(s): "CHOL", "HDL", "LDLCALC", "TRIG", "CHOLHDL", "LDLDIRECT" in the last 72 hours. Thyroid Function Tests: Recent Labs    12/16/21 0620 12/16/21 1117  TSH 18.474*  --   FREET4  --  0.89  T3FREE  --  1.8*   Anemia Panel: Recent Labs    12/17/21 0617  TIBC 314  IRON 56   Sepsis Labs: Recent Labs  Lab 12/16/21 0419 12/17/21 0617  PROCALCITON <0.10 0.84    Recent Results (from the past 240 hour(s))  Resp Panel by RT-PCR (Flu A&B, Covid) Anterior Nasal Swab     Status: None   Collection Time: 12/16/21  4:19 AM   Specimen: Anterior Nasal Swab  Result Value Ref Range Status   SARS Coronavirus 2 by RT PCR NEGATIVE NEGATIVE Final    Comment: (NOTE) SARS-CoV-2 target nucleic acids are NOT DETECTED.  The SARS-CoV-2 RNA is generally detectable in upper respiratory specimens during the acute phase of infection. The lowest concentration of SARS-CoV-2 viral copies this assay can detect is 138 copies/mL. A negative result does not preclude SARS-Cov-2 infection and should not be used as the sole basis for treatment or other patient management decisions. A negative result may occur with  improper specimen collection/handling, submission of specimen other than nasopharyngeal swab, presence of viral mutation(s) within the areas targeted by this assay, and inadequate number of viral copies(<138 copies/mL). A negative result must be combined with clinical observations, patient history, and epidemiological information. The expected result is Negative.  Fact Sheet for Patients:  EntrepreneurPulse.com.au  Fact Sheet for Healthcare Providers:  IncredibleEmployment.be  This test is no t yet approved or cleared by the [place] FDA and  has been authorized for detection and/or diagnosis of SARS-CoV-2 by FDA under an  Emergency Use Authorization (EUA). This EUA will remain  in effect (meaning this test can be used) for the duration of the COVID-19 declaration under Section 564(b)(1) of the Act, 21 U.S.C.section 360bbb-3(b)(1), unless the authorization is terminated  or revoked sooner.       Influenza A by PCR NEGATIVE NEGATIVE Final   Influenza B by PCR NEGATIVE NEGATIVE Final    Comment: (NOTE) The Xpert Xpress SARS-CoV-2/FLU/RSV plus assay is intended as an aid in the diagnosis of influenza from Nasopharyngeal swab specimens and should not be used as a sole basis for treatment. Nasal washings and aspirates are unacceptable for Xpert Xpress SARS-CoV-2/FLU/RSV testing.  Fact Sheet for Patients: EntrepreneurPulse.com.au  Fact Sheet for Healthcare Providers: IncredibleEmployment.be  This test is not yet approved or cleared by the [place]  States FDA and has been authorized for detection and/or diagnosis of SARS-CoV-2 by FDA under an Emergency Use Authorization (EUA). This EUA will remain in effect (meaning this test can be used) for the duration of the COVID-19 declaration under Section 564(b)(1) of the Act, 21 U.S.C. section 360bbb-3(b)(1), unless the authorization is terminated or revoked.  Performed at [facility], [address] [place], [place] [address]   MRSA Next Gen by PCR, Nasal     Status: None   Collection Time: 12/16/21  6:56 AM   Specimen: Nasal Mucosa; Nasal Swab  Result Value Ref Range Status   MRSA by PCR Next Gen NOT DETECTED NOT DETECTED Final    Comment: (NOTE) The GeneXpert MRSA Assay (FDA approved for NASAL specimens only), is one component of a comprehensive MRSA colonization surveillance program. It is not intended to diagnose MRSA infection nor to guide or monitor treatment for MRSA infections. Test performance is not FDA approved in patients less than 81 years old. Performed at [facility], [address] [place], [place] [address]          Radiology Studies: ECHOCARDIOGRAM COMPLETE  Result Date: 12/16/2021    ECHOCARDIOGRAM REPORT   Patient Name:   Tracy Silva Date of Exam: 12/16/2021 Medical Rec #:  [id number]       Height:       63.0 in Accession #:    [id number]      Weight:       155.4 lb Date of Birth:  [birth year]       BSA:          1.737 m Patient Age:    81 years        BP:           135/62 mmHg Patient Gender: F               HR:           88 bpm. Exam Location:  [facility] Procedure: 2D Echo, Color Doppler and Cardiac Doppler Indications:     I42.9 Cardiomyopathy-unspecified  History:         Patient has no prior history of Echocardiogram examinations.                  Risk Factors:Hypertension, Diabetes and HCL.  Sonographer:     Charmayne Sheer Referring Phys:  [id number] [place] TANG Diagnosing Phys: Serafina Royals MD IMPRESSIONS  1. Left ventricular ejection fraction, by estimation, is 25 to 30%. The left ventricle has severely decreased function. The left ventricle demonstrates global hypokinesis. The left ventricular internal cavity size was moderately dilated. There is moderate left ventricular hypertrophy. Left ventricular diastolic parameters are consistent with Grade I diastolic dysfunction (impaired relaxation).  2. Right ventricular systolic function is normal. The right ventricular size is normal.  3. Left atrial size was mildly dilated.  4. The mitral valve is normal in structure. Mild to moderate mitral valve regurgitation.  5. Tricuspid valve regurgitation is mild to moderate.  6. The aortic valve is calcified. Aortic valve regurgitation is mild. Moderate aortic valve stenosis. FINDINGS  Left Ventricle: Left ventricular ejection fraction, by estimation, is 25 to 30%. The left ventricle has severely decreased function. The left ventricle demonstrates global hypokinesis. The left ventricular internal cavity size was moderately dilated. There is moderate left ventricular hypertrophy.  Left ventricular diastolic parameters are consistent with Grade I diastolic dysfunction (impaired relaxation). Right Ventricle: The right ventricular size is normal. No increase  in right ventricular wall thickness. Right ventricular systolic function is normal. Left Atrium: Left atrial size was mildly dilated. Right Atrium: Right atrial size was normal in size. Pericardium: There is no evidence of pericardial effusion. Mitral Valve: The mitral valve is normal in structure. Mild to moderate mitral valve regurgitation. Tricuspid Valve: The tricuspid valve is normal in structure. Tricuspid valve regurgitation is mild to moderate. Aortic Valve: The aortic valve is calcified. Aortic valve regurgitation is mild. Aortic regurgitation PHT measures 244 msec. Moderate aortic stenosis is present. Aortic valve mean gradient measures 29.0 mmHg. Aortic valve peak gradient measures 46.4 mmHg. Aortic valve area, by VTI measures 0.69 cm. Pulmonic Valve: The pulmonic valve was normal in structure. Pulmonic valve regurgitation is trivial. Aorta: The aortic root and ascending aorta are structurally normal, with no evidence of dilitation. IAS/Shunts: No atrial level shunt detected by color flow Doppler.  LEFT VENTRICLE PLAX 2D LVIDd:         3.70 cm      Diastology LVIDs:         3.50 cm      LV e' medial:    4.13 cm/s LV PW:         0.90 cm      LV E/e' medial:  42.5 LV IVS:        0.90 cm      LV e' lateral:   16.90 cm/s LVOT diam:     1.80 cm      LV E/e' lateral: 10.4 LV SV:         55 LV SV Index:   31 LVOT Area:     2.54 cm  LV Volumes (MOD) LV vol d, MOD A2C: 96.0 ml LV vol d, MOD A4C: 108.0 ml LV vol s, MOD A2C: 59.3 ml LV vol s, MOD A4C: 79.8 ml LV SV MOD A2C:     36.7 ml LV SV MOD A4C:     108.0 ml LV SV MOD BP:      34.4 ml RIGHT VENTRICLE RV Basal diam:  3.30 cm RV S prime:     10.70 cm/s TAPSE (M-mode): 2.1 cm LEFT ATRIUM             Index        RIGHT ATRIUM           Index LA diam:        3.50 cm 2.01 cm/m   RA Area:      11.30 cm LA Vol (A2C):   41.4 ml 23.83 ml/m  RA Volume:   25.60 ml  14.74 ml/m LA Vol (A4C):   38.8 ml 22.34 ml/m LA Biplane Vol: 40.0 ml 23.03 ml/m  AORTIC VALVE                     PULMONIC VALVE AV Area (Vmax):    0.85 cm      PV Vmax:       2.68 m/s AV Area (Vmean):   0.79 cm      PV Vmean:      207.000 cm/s AV Area (VTI):     0.69 cm      PV VTI:        0.631 m AV Vmax:           340.50 cm/s   PV Peak grad:  28.7 mmHg AV Vmean:          258.000 cm/s  PV Mean grad:  18.0 mmHg AV VTI:  0.791 m AV Peak Grad:      46.4 mmHg AV Mean Grad:      29.0 mmHg LVOT Vmax:         114.00 cm/s LVOT Vmean:        80.000 cm/s LVOT VTI:          0.215 m LVOT/AV VTI ratio: 0.27 AI PHT:            244 msec  AORTA Ao Root diam: 3.10 cm MITRAL VALVE MV Area (PHT): 5.44 cm     SHUNTS MV Decel Time: 139 msec     Systemic VTI:  0.22 m MV E velocity: 175.33 cm/s  Systemic Diam: 1.80 cm Serafina Royals MD Electronically signed by Serafina Royals MD Signature Date/Time: 12/16/2021/4:52:01 PM    Final    DG Chest Portable 1 View  Result Date: 12/16/2021 CLINICAL DATA:  81 year old female with chest pain for 3 days and shortness of breath. Retractions. EXAM: PORTABLE CHEST 1 VIEW COMPARISON:  CT Abdomen and Pelvis 07/22/2016. FINDINGS: Portable AP upright view at 0424 hours. Calcified aortic atherosclerosis. Cardiac size appears to remain normal. Indistinct appearance of the bilateral hila. Relatively large lung volumes with widespread bilateral pulmonary interstitial opacity, coarse in the perihilar regions. No superimposed pneumothorax or consolidation. No pleural effusion identified. Combined disc like and pin-like metallic foreign body projects over the midline in the aortic arch region partially obscuring the trachea. Etiology and significance is unclear. No acute osseous abnormality identified. Negative visible bowel gas. IMPRESSION: 1. Widespread bilateral pulmonary interstitial opacity with no definite  pleural fluid. Consider Bilateral Pneumonia versus Pulmonary Edema. Viral/atypical infectious etiology possible. 2. Combined disc- and pin-like metallic foreign body projects over the midline in the aortic arch region, etiology and significance unclear. 3. Aortic Atherosclerosis (ICD10-I70.0). Electronically Signed   By: Genevie Ann M.D.   On: 12/16/2021 04:52        Scheduled Meds:  aspirin  81 mg Oral Daily   Chlorhexidine Gluconate Cloth  6 each Topical Daily   [START ON 12/18/2021] clopidogrel  75 mg Oral Daily   ezetimibe  10 mg Oral q morning   hydrALAZINE  10 mg Oral BID   insulin aspart  0-15 Units Subcutaneous Q4H   isosorbide mononitrate  15 mg Oral BID   levothyroxine  75 mcg Oral Q0600   metoprolol succinate  12.5 mg Oral Daily   pantoprazole (PROTONIX) IV  40 mg Intravenous Q24H   Continuous Infusions:  heparin 850 Units/hr (12/17/21 1400)     LOS: 1 day    Time spent: 35 mins     Wyvonnia Dusky, MD [facility] Pager [phone]  If 7PM-7AM, please contact night-coverage [email or web address] 12/17/2021, 4:30 PM

## 2021-12-17 NOTE — Evaluation (Signed)
Occupational Therapy Evaluation Patient Details Name: Tracy Silva MRN: [id number] DOB: 03-Apr-1940 Today's Date: 12/17/2021   History of Present Illness 66yoF with a PMH of HTN, DM2, chronic LBBB, hypothyroidism, hx rectal cancer s/p colectomy (2018) who [facility] ED 12/16/2021 in the early morning hours in respiratory distress requiring BiPAP in the emergency department.  Cardiology is consulted for further assistance characterization of the etiology of her shortness of breath, query new CHF versus hypertensive urgency   Clinical Impression   Patient presenting with decreased Ind in self care,balance, functional mobility/transfers, endurance, and safety awareness. Patient reports living at home independently with her husband whom she is a caregiver for. She repoerts, " I don't want to go back home" and expresses thoughts of caregiver burnout. Pt performs bed mobility with supervision. She attempts figure four position but unable to don socks without assistance. She ambulates 49' with min guard - min A for balance while holding onto IV pole. Patient will benefit from acute OT to increase overall independence in the areas of ADLs, functional mobility, and safety awareness in order to safely discharge home with family support.      Recommendations for follow up therapy are one component of a multi-disciplinary discharge planning process, led by the attending physician.  Recommendations may be updated based on patient status, additional functional criteria and insurance authorization.   Follow Up Recommendations  Home health OT     Assistance Recommended at Discharge Intermittent Supervision/Assistance  Patient can return home with the following A little help with walking and/or transfers;A little help with bathing/dressing/bathroom;Assistance with cooking/housework;Help with stairs or ramp for entrance;Assist for transportation    Functional Status Assessment  Patient has had a  recent decline in their functional status and demonstrates the ability to make significant improvements in function in a reasonable and predictable amount of time.  Equipment Recommendations  Other (comment) (RW)       Precautions / Restrictions Precautions Precautions: Fall         Balance Overall balance assessment: Needs assistance Sitting-balance support: Feet supported Sitting balance-Leahy Scale: Good     Standing balance support: Reliant on assistive device for balance, During functional activity, Bilateral upper extremity supported Standing balance-Leahy Scale: Fair                             ADL either performed or assessed with clinical judgement   ADL Overall ADL's : Needs assistance/impaired                                       General ADL Comments: min guard- min A for self care tasks and functional mobility     Vision Patient Visual Report: No change from baseline              Pertinent Vitals/Pain Pain Assessment Pain Assessment: No/denies pain     Hand Dominance Right   Extremity/Trunk Assessment Upper Extremity Assessment Upper Extremity Assessment: Overall WFL for tasks assessed;Generalized weakness   Lower Extremity Assessment Lower Extremity Assessment: Overall WFL for tasks assessed;Generalized weakness       Communication Communication Communication: No difficulties   Cognition Arousal/Alertness: Awake/alert Behavior During Therapy: Restless Overall Cognitive Status: Within Functional Limits for tasks assessed  General Comments: Pt answers orientation qustions appropriately but is very slow to process throughout session                [place] expects to be discharged to:: Private residence Living Arrangements: Spouse/significant other (she is caregiver for her husband) Available Help at Discharge: Family;Available  PRN/intermittently Type of Home: House Home Access: Ramped entrance   Entrance Stairs-Rails: Right Home Layout: One level               Home Equipment: None   Additional Comments: husband has some equipment for himself      Prior Functioning/Environment Prior Level of Function : Independent/Modified Independent             Mobility Comments: pt reports independent without use of AD and furniture walking. Multiple falls recently ADLs Comments: Pt reports being independent and also being caregiver for her husband at home.        OT Problem List: Decreased strength;Pain;Decreased activity tolerance;Impaired balance (sitting and/or standing);Decreased knowledge of use of DME or AE;Decreased cognition;Decreased safety awareness      OT Treatment/Interventions: Self-care/ADL training;Therapeutic exercise;Therapeutic activities;DME and/or AE instruction;Balance training;Patient/family education;Energy conservation    OT Goals(Current goals can be found in the care plan section) Acute Rehab OT Goals Patient Stated Goal: " I don't want to go back home" OT Goal Formulation: With patient Time For Goal Achievement: 01/01/22 Potential to Achieve Goals: Good ADL Goals Pt Will Perform Grooming: with modified independence;standing Pt Will Perform Lower Body Dressing: with modified independence;sit to/from stand Pt Will Transfer to Toilet: with modified independence;ambulating Pt Will Perform Toileting - Clothing Manipulation and hygiene: with modified independence;sit to/from stand  OT Frequency: Min 2X/week       AM-PAC OT "6 Clicks" Daily Activity     Outcome Measure Help from another person eating meals?: None Help from another person taking care of personal grooming?: A Little Help from another person toileting, which includes using toliet, bedpan, or urinal?: A Little Help from another person bathing (including washing, rinsing, drying)?: A Little Help from another person  to put on and taking off regular upper body clothing?: None Help from another person to put on and taking off regular lower body clothing?: A Little 6 Click Score: 20   End of Session Nurse Communication: Mobility status  Activity Tolerance: Patient tolerated treatment well Patient left: in bed;with call bell/phone within reach;with bed alarm set  OT Visit Diagnosis: Unsteadiness on feet (R26.81);Repeated falls (R29.6);Muscle weakness (generalized) (M62.81)                Time: 0630-1601 OT Time Calculation (min): 25 min Charges:  OT General Charges $OT Visit: 1 Visit OT Evaluation $OT Eval Moderate Complexity: 1 Mod OT Treatments $Therapeutic Activity: 8-22 mins  Darleen Crocker, MS, OTR/L , CBIS ascom [phone]  12/17/21, 11:41 AM

## 2021-12-17 NOTE — Evaluation (Signed)
Physical Therapy Evaluation Patient Details Name: Tracy Silva MRN: [id number] DOB: [birth year] Today's Date: 12/17/2021  History of Present Illness  46yoF with a PMH of HTN, DM2, chronic LBBB, hypothyroidism, hx rectal cancer s/p colectomy (2018) who presented to [facility] ED 12/16/2021 in the early morning hours in respiratory distress requiring BiPAP in the emergency department.  Cardiology is consulted for further assistance characterization of the etiology of her shortness of breath, query new CHF versus hypertensive urgency  Clinical Impression  Pt does not typically use an AD, per OT treatment earlier today unsteady with IV/no UE use.  She did do well with prolonged bout of ambulation with RW.  She was able to maintain consistent cadence and did not have any LOBs or overt safety issues.  She had some fatigue, but O2 remained in the 90s, HR rose from 100s to ~120 with the effort.  She reports she has been having more and more difficulty caring for her husband and is anxious about returning home.  Daughter has been caring for him since pt arrived, present during session and part of the planning process, discussed aides/outside help with them as well as likely scenarios for plan moving forward.  Pt moved relatively well with a walker (does not have currently) recommending HHPT with RW to address changes in functional status and to facilitate safe transition home once medically cleared for d/c.         Recommendations for follow up therapy are one component of a multi-disciplinary discharge planning process, led by the attending physician.  Recommendations may be updated based on patient status, additional functional criteria and insurance authorization.  Follow Up Recommendations Home health PT      Assistance Recommended at Discharge Intermittent Supervision/Assistance  Patient can return home with the following  A little help with walking and/or transfers;A little help with  bathing/dressing/bathroom;Assistance with cooking/housework;Assist for transportation    Equipment Recommendations Rolling walker (2 wheels)  Recommendations for Other Services       Functional Status Assessment Patient has had a recent decline in their functional status and demonstrates the ability to make significant improvements in function in a reasonable and predictable amount of time.     Precautions / Restrictions Precautions Precautions: Fall Restrictions Weight Bearing Restrictions: No      Mobility  Bed Mobility Overal bed mobility: Modified Independent             General bed mobility comments: Pt able to get up to sitting w/o assist, slower and more labored getting LEs back up into bed, but able to do so w/o direct assist    Transfers Overall transfer level: Modified independent Equipment used: Rolling walker (2 wheels)               General transfer comment: rose to standing with relative ease from sitting EOB    Ambulation/Gait Ambulation/Gait assistance: Supervision Gait Distance (Feet): 150 Feet Assistive device: Rolling walker (2 wheels)         General Gait Details: Pt was able to maintain consistent cadence during prolonged bout of ambulation with walker.  O2 remained in the 90s on room air.  No LOBs or overt safety issues.  Stairs            Wheelchair Mobility    Modified Rankin (Stroke Patients Only)       Balance Overall balance assessment: Needs assistance Sitting-balance support: Feet supported Sitting balance-Leahy Scale: Good     Standing balance support: Reliant on assistive device  for balance, During functional activity, Bilateral upper extremity supported Standing balance-Leahy Scale: Good                               Pertinent Vitals/Pain Pain Assessment Pain Assessment: No/denies pain    Home Living Family/patient expects to be discharged to:: Private residence Living Arrangements:  Spouse/significant other Available Help at Discharge: Family;Available PRN/intermittently Type of Home: House Home Access: Ramped entrance Entrance Stairs-Rails: Right     Home Layout: One level Home Equipment: None Additional Comments: husband has some equipment for himself    Prior Function Prior Level of Function : Independent/Modified Independent             Mobility Comments: pt reports independent without use of AD, occasional furniture walking. reports a few recent falls ADLs Comments: Pt reports being independent and also being caregiver for her husband at home.     Hand Dominance   Dominant Hand: Right    Extremity/Trunk Assessment   Upper Extremity Assessment Upper Extremity Assessment: Overall WFL for tasks assessed;Generalized weakness    Lower Extremity Assessment Lower Extremity Assessment: Overall WFL for tasks assessed;Generalized weakness       Communication   Communication: No difficulties  Cognition Arousal/Alertness: Awake/alert Behavior During Therapy: Restless Overall Cognitive Status: Within Functional Limits for tasks assessed                                          General Comments General comments (skin integrity, edema, etc.): pt reports taking care of her husband has been getting progressively more difficult for her    Exercises     Assessment/Plan    PT Assessment Patient needs continued PT services  PT Problem List Decreased strength;Decreased activity tolerance;Decreased balance;Decreased mobility;Decreased knowledge of use of DME;Decreased safety awareness       PT Treatment Interventions Gait training;Functional mobility training;Therapeutic activities;Therapeutic exercise;Balance training;DME instruction;Patient/family education    PT Goals (Current goals can be found in the Care Plan section)  Acute Rehab PT Goals Patient Stated Goal: not go home PT Goal Formulation: With patient/family Time For Goal  Achievement: 12/30/21 Potential to Achieve Goals: Fair    Frequency Min 2X/week     Co-evaluation               AM-PAC PT "6 Clicks" Mobility  Outcome Measure Help needed turning from your back to your side while in a flat bed without using bedrails?: None Help needed moving from lying on your back to sitting on the side of a flat bed without using bedrails?: None Help needed moving to and from a bed to a chair (including a wheelchair)?: A Little Help needed standing up from a chair using your arms (e.g., wheelchair or bedside chair)?: A Little Help needed to walk in hospital room?: A Little Help needed climbing 3-5 steps with a railing? : A Little 6 Click Score: 20    End of Session Equipment Utilized During Treatment: Gait belt Activity Tolerance: Patient tolerated treatment well Patient left: with bed alarm set;with call bell/phone within reach;with family/visitor present Nurse Communication: Mobility status PT Visit Diagnosis: Muscle weakness (generalized) (M62.81);Difficulty in walking, not elsewhere classified (R26.2)    Time: 1057-1130 PT Time Calculation (min) (ACUTE ONLY): 33 min   Charges:   PT Evaluation $PT Eval Low Complexity: 1 Low PT Treatments $Gait  Training: 8-22 mins        Kreg Shropshire, DPT 12/17/2021, 2:47 PM

## 2021-12-17 NOTE — Consult Note (Signed)
PHARMACY CONSULT NOTE - FOLLOW UP  Pharmacy Consult for Electrolyte Monitoring and Replacement   Recent Labs: Potassium (mmol/L)  Date Value  12/17/2021 3.7  07/01/2012 4.1   Magnesium (mg/dL)  Date Value  12/17/2021 2.0   Calcium (mg/dL)  Date Value  12/17/2021 9.0   Calcium, Total (mg/dL)  Date Value  07/01/2012 9.1   Albumin (g/dL)  Date Value  12/16/2021 3.9  09/30/2021 4.2   Phosphorus (mg/dL)  Date Value  12/17/2021 4.5   Sodium (mmol/L)  Date Value  12/17/2021 137  09/30/2021 144  07/01/2012 137     Assessment: [name] w/ h/o T2DM (A1c 6.2%), HTN, hypothyroid, HLD, LBBB, CKD3b (BL Scr 1.6-2) Colon & renal CA, & MDD presenting to ED with c/o CP & dyspnea x2-3days admitted to CCU with development of acute hypoxic respiratory failure ISO new onset Acute CHF vs hypertensive urgency with flash pulmonary edema. Pharmacy consulted for the mgmt of electrolytes.  Medications: Lasix '60mg'$  IV x1 (11/21)  Goal of Therapy:  Lytes WNL.   Plan:  Scr 1.66>1.81>2.15 (at baseline 1.6-2).  Currently lytes all WNL, but Scr increasing with intermittent diuresis and new onset CHF w/u ongoing. Next Labs with 11/22 0500 set.  Tracy Silva ,PharmD Clinical Pharmacist 12/17/2021 9:27 AM

## 2021-12-17 NOTE — Progress Notes (Signed)
As needed only, no indication for use at this time.

## 2021-12-17 NOTE — Progress Notes (Signed)
[place] NOTE       Patient ID: Tracy Silva MRN: [id number] DOB/AGE: 02-Jul-1940 81 y.o.  Admit date: 12/16/2021 Referring Physician Donell Beers, NP  Primary Physician Dr. Alba Cory Primary Cardiologist Dr. Saralyn Pilar Reason for Consultation SOB query new CHF vs htn urgency  HPI: Tracy Silva is an 81yoF with a PMH of HTN, DM2, chronic LBBB, hypothyroidism, hx rectal cancer s/p colectomy (2018) who presented to [facility] ED 12/16/2021 in the early morning hours in respiratory distress requiring BiPAP in the emergency department.  Cardiology is consulted for further assistance characterization of the etiology of her shortness of breath. TTE revealed a new HFrEF (25-30% & global hypo) with moderate aortic stenosis (possibly low flow & severe AS since her EF is poor) of uncertain chronicity (no priors for comparison).   Interval History: -doesn't feel as good today, notes some nasal congestion. Doesn't feel short of breath and was weaned to room air. No further chest pain. No palpitations or heart racing.  -diuresed well, net neg 1.8L -renal function worsening (Cr. 1.15 and GFR 22, prev 1.8 & 27)  -Troponin peak yesterday at 2900   Review of systems complete and found to be negative unless listed above     Past Medical History:  Diagnosis Date   Acid reflux    Anemia    H/O AS A CHILD   Arthritis    FINGERS AND SHOULDER-RIGHT   Asthma    AS A CHILD   Diabetes mellitus without complication (HCC)    Heart murmur    ASYMPTOMATIC   High cholesterol    Hypertension    Hypothyroidism    Other and unspecified noninfectious gastroenteritis and colitis(558.9) 06/27/2012   Rectal bleeding 2011   Rectal cancer ([place]) 07/16/2016    Past Surgical History:  Procedure Laterality Date   COLONOSCOPY  2011   X2   COLONOSCOPY N/A 07/27/2016   Procedure: COLONOSCOPY IN O.R;  Surgeon: Robert Bellow, MD;  Location: [facility] ORS;  Service: General;  Laterality:  N/A;   IUD REMOVAL     LAPAROSCOPIC PARTIAL COLECTOMY Left 08/21/2016   Procedure: LAPAROSCOPIC ASSISTED LEFT HEMI-COLECTOMY;  Surgeon: Robert Bellow, MD;  Location: [facility] ORS;  Service: General;  Laterality: Left;   RECTAL EXAM UNDER ANESTHESIA N/A 07/27/2016   Procedure: RECTAL EXAM UNDER ANESTHESIA;  Surgeon: Robert Bellow, MD;  Location: [facility] ORS;  Service: General;  Laterality: N/A;   TONSILLECTOMY     TOOTH EXTRACTION     TRANSANAL EXCISION OF RECTAL MASS  07/27/2016   Procedure: EXCISION OF RECTAL MASS;  Surgeon: Robert Bellow, MD;  Location: [facility] ORS;  Service: General;;   WISDOM TOOTH EXTRACTION      Medications Prior to Admission  Medication Sig Dispense Refill Last Dose   ezetimibe (ZETIA) 10 MG tablet TAKE 1 TABLET BY MOUTH EVERY MORNING 90 tablet 3    levothyroxine (SYNTHROID) 75 MCG tablet TAKE ONE TABLET ON AN EMPTY STOMACH WITHA GLASS OF WATER AT LEAST 30 TO 60 MINUTES BEFORE BREAKFAST 30 tablet 9    metoprolol succinate (TOPROL-XL) 25 MG 24 hr tablet Take 1 tablet (25 mg total) by mouth daily. 90 tablet 1    Multiple Vitamins-Calcium (ONE-A-DAY WOMENS PO) Take by mouth daily.      omeprazole (PRILOSEC OTC) 20 MG tablet Take 20 mg by mouth daily as needed.      Social History   Socioeconomic History   Marital status: Married    Spouse name:  Not on file   Number of children: Not on file   Years of education: Not on file   Highest education level: Not on file  Occupational History   Not on file  Tobacco Use   Smoking status: Former    Packs/day: 0.50    Years: 4.00    Total pack years: 2.00    Types: Cigarettes    Quit date: 01/26/1971    Years since quitting: 50.9   Smokeless tobacco: Never  Vaping Use   Vaping Use: Never used  Substance and Sexual Activity   Alcohol use: Yes    Comment: occasionally with dinner   Drug use: No   Sexual activity: Not on file  Other Topics Concern   Not on file  Social History Narrative   Not on file   Social  Determinants of Health   Financial Resource Strain: Not on file  Food Insecurity: Not on file  Transportation Needs: Not on file  Physical Activity: Not on file  Stress: Not on file  Social Connections: Not on file  Intimate Partner Violence: Not on file    Family History  Problem Relation Age of Onset   Pancreatic cancer Mother    Breast cancer Paternal Aunt    Cancer Maternal Aunt      Vitals:   12/17/21 0500 12/17/21 0600 12/17/21 0700 12/17/21 0736  BP: (!) 135/56 (!) 141/58 125/75   Pulse: 76 79 92   Resp: '19 17 15   '$ Temp:    98 F (36.7 C)  TempSrc:    Oral  SpO2: 95% 90% 94%   Weight:      Height:        PHYSICAL EXAM General: Pleasant elderly Caucasian female, well nourished, in no acute distress.  Sitting upright in ICU bed no family at bedside. HEENT:  Normocephalic and atraumatic. Neck:  No JVD. R carotid bruit present Lungs: Normal respiratory effort on room air.  Trace bibasilar crackles, overall improving.   Heart: HRRR . Normal S1 and soft S2.  3/6 systolic murmur best heard at the RUSB.  Abdomen: Non-distended appearing.  Msk: Normal strength and tone for age. Extremities: Warm and well perfused. No clubbing, cyanosis.  Generally edematous bilateral lower extremities with chronic venous stasis hyperpigmentation, improving..   Neuro: Alert and oriented X 3. Psych:  Answers questions appropriately.   Labs: Basic Metabolic Panel: Recent Labs    12/16/21 1311 12/16/21 1553 12/17/21 0617  NA  --  138 137  K 4.4 4.1 3.7  CL  --  105 103  CO2  --  24 26  GLUCOSE  --  90 97  BUN  --  34* 41*  CREATININE  --  1.81* 2.15*  CALCIUM  --  8.8* 9.0  MG 2.1 2.0 2.0  PHOS 2.8  --  4.5    Liver Function Tests: Recent Labs    12/16/21 0419  AST 25  ALT 14  ALKPHOS 67  BILITOT 0.7  PROT 7.1  ALBUMIN 3.9    No results for input(s): "LIPASE", "AMYLASE" in the last 72 hours. CBC: Recent Labs    12/16/21 0419 12/17/21 0617  WBC 7.6 5.4  HGB  9.7* 9.0*  HCT 30.4* 27.8*  MCV 103.4* 100.0  PLT 199 184    Cardiac Enzymes: Recent Labs    12/16/21 1117 12/16/21 1256 12/16/21 1553  TROPONINIHS 1,681* 2,256* 2,959*    BNP: Recent Labs    12/16/21 0419  BNP 1,055.5*  D-Dimer: No results for input(s): "DDIMER" in the last 72 hours. Hemoglobin A1C: No results for input(s): "HGBA1C" in the last 72 hours. Fasting Lipid Panel: No results for input(s): "CHOL", "HDL", "LDLCALC", "TRIG", "CHOLHDL", "LDLDIRECT" in the last 72 hours. Thyroid Function Tests: Recent Labs    12/16/21 0620 12/16/21 1117  TSH 18.474*  --   T3FREE  --  1.8*    Anemia Panel: Recent Labs    12/17/21 0617  TIBC 314  IRON 56     Radiology: ECHOCARDIOGRAM COMPLETE  Result Date: 12/16/2021    ECHOCARDIOGRAM REPORT   Patient Name:   Tracy Silva Date of Exam: 12/16/2021 Medical Rec #:  [id number]       Height:       63.0 in Accession #:    [id number]      Weight:       155.4 lb Date of Birth:  Jun 24, 1940       BSA:          1.737 m Patient Age:    40 years        BP:           135/62 mmHg Patient Gender: F               HR:           88 bpm. Exam Location:  [facility] Procedure: 2D Echo, Color Doppler and Cardiac Doppler Indications:     I42.9 Cardiomyopathy-unspecified  History:         Patient has no prior history of Echocardiogram examinations.                  Risk Factors:Hypertension, Diabetes and HCL.  Sonographer:     Charmayne Sheer Referring Phys:  [id number] [place] [employer]an Diagnosing Phys: Serafina Royals MD IMPRESSIONS  1. Left ventricular ejection fraction, by estimation, is 25 to 30%. The left ventricle has severely decreased function. The left ventricle demonstrates global hypokinesis. The left ventricular internal cavity size was moderately dilated. There is moderate left ventricular hypertrophy. Left ventricular diastolic parameters are consistent with Grade I diastolic dysfunction (impaired relaxation).  2. Right ventricular systolic function  is normal. The right ventricular size is normal.  3. Left atrial size was mildly dilated.  4. The mitral valve is normal in structure. Mild to moderate mitral valve regurgitation.  5. Tricuspid valve regurgitation is mild to moderate.  6. The aortic valve is calcified. Aortic valve regurgitation is mild. Moderate aortic valve stenosis. FINDINGS  Left Ventricle: Left ventricular ejection fraction, by estimation, is 25 to 30%. The left ventricle has severely decreased function. The left ventricle demonstrates global hypokinesis. The left ventricular internal cavity size was moderately dilated. There is moderate left ventricular hypertrophy. Left ventricular diastolic parameters are consistent with Grade I diastolic dysfunction (impaired relaxation). Right Ventricle: The right ventricular size is normal. No increase in right ventricular wall thickness. Right ventricular systolic function is normal. Left Atrium: Left atrial size was mildly dilated. Right Atrium: Right atrial size was normal in size. Pericardium: There is no evidence of pericardial effusion. Mitral Valve: The mitral valve is normal in structure. Mild to moderate mitral valve regurgitation. Tricuspid Valve: The tricuspid valve is normal in structure. Tricuspid valve regurgitation is mild to moderate. Aortic Valve: The aortic valve is calcified. Aortic valve regurgitation is mild. Aortic regurgitation PHT measures 244 msec. Moderate aortic stenosis is present. Aortic valve mean gradient measures 29.0 mmHg. Aortic valve peak gradient measures 46.4 mmHg. Aortic valve area,  by VTI measures 0.69 cm. Pulmonic Valve: The pulmonic valve was normal in structure. Pulmonic valve regurgitation is trivial. Aorta: The aortic root and ascending aorta are structurally normal, with no evidence of dilitation. IAS/Shunts: No atrial level shunt detected by color flow Doppler.  LEFT VENTRICLE PLAX 2D LVIDd:         3.70 cm      Diastology LVIDs:         3.50 cm      LV e'  medial:    4.13 cm/s LV PW:         0.90 cm      LV E/e' medial:  42.5 LV IVS:        0.90 cm      LV e' lateral:   16.90 cm/s LVOT diam:     1.80 cm      LV E/e' lateral: 10.4 LV SV:         55 LV SV Index:   31 LVOT Area:     2.54 cm  LV Volumes (MOD) LV vol d, MOD A2C: 96.0 ml LV vol d, MOD A4C: 108.0 ml LV vol s, MOD A2C: 59.3 ml LV vol s, MOD A4C: 79.8 ml LV SV MOD A2C:     36.7 ml LV SV MOD A4C:     108.0 ml LV SV MOD BP:      34.4 ml RIGHT VENTRICLE RV Basal diam:  3.30 cm RV S prime:     10.70 cm/s TAPSE (M-mode): 2.1 cm LEFT ATRIUM             Index        RIGHT ATRIUM           Index LA diam:        3.50 cm 2.01 cm/m   RA Area:     11.30 cm LA Vol (A2C):   41.4 ml 23.83 ml/m  RA Volume:   25.60 ml  14.74 ml/m LA Vol (A4C):   38.8 ml 22.34 ml/m LA Biplane Vol: 40.0 ml 23.03 ml/m  AORTIC VALVE                     PULMONIC VALVE AV Area (Vmax):    0.85 cm      PV Vmax:       2.68 m/s AV Area (Vmean):   0.79 cm      PV Vmean:      207.000 cm/s AV Area (VTI):     0.69 cm      PV VTI:        0.631 m AV Vmax:           340.50 cm/s   PV Peak grad:  28.7 mmHg AV Vmean:          258.000 cm/s  PV Mean grad:  18.0 mmHg AV VTI:            0.791 m AV Peak Grad:      46.4 mmHg AV Mean Grad:      29.0 mmHg LVOT Vmax:         114.00 cm/s LVOT Vmean:        80.000 cm/s LVOT VTI:          0.215 m LVOT/AV VTI ratio: 0.27 AI PHT:            244 msec  AORTA Ao Root diam: 3.10 cm MITRAL VALVE MV Area (PHT): 5.44 cm     SHUNTS  MV Decel Time: 139 msec     Systemic VTI:  0.22 m MV E velocity: 175.33 cm/s  Systemic Diam: 1.80 cm Serafina Royals MD Electronically signed by Serafina Royals MD Signature Date/Time: 12/16/2021/4:52:01 PM    Final    DG Chest Portable 1 View  Result Date: 12/16/2021 CLINICAL DATA:  81 year old female with chest pain for 3 days and shortness of breath. Retractions. EXAM: PORTABLE CHEST 1 VIEW COMPARISON:  CT Abdomen and Pelvis 07/22/2016. FINDINGS: Portable AP upright view at 0424 hours.  Calcified aortic atherosclerosis. Cardiac size appears to remain normal. Indistinct appearance of the bilateral hila. Relatively large lung volumes with widespread bilateral pulmonary interstitial opacity, coarse in the perihilar regions. No superimposed pneumothorax or consolidation. No pleural effusion identified. Combined disc like and pin-like metallic foreign body projects over the midline in the aortic arch region partially obscuring the trachea. Etiology and significance is unclear. No acute osseous abnormality identified. Negative visible bowel gas. IMPRESSION: 1. Widespread bilateral pulmonary interstitial opacity with no definite pleural fluid. Consider Bilateral Pneumonia versus Pulmonary Edema. Viral/atypical infectious etiology possible. 2. Combined disc- and pin-like metallic foreign body projects over the midline in the aortic arch region, etiology and significance unclear. 3. Aortic Atherosclerosis (ICD10-I70.0). Electronically Signed   By: Genevie Ann M.D.   On: 12/16/2021 04:52    TELEMETRY reviewed by me (LT) 12/17/2021 : Sinus rhythm to sinus tachycardia with a LBBB rate 79-1 04  EKG reviewed by me: NSR LBBB rate  Data reviewed by me (LT) 12/17/2021: Hospitalist progress note, PT and OT notes CBC BMP vitals telemetry and troponins I's and O's  Principal Problem:   Acute respiratory failure ([place]) Active Problems:   Acute heart failure with preserved ejection fraction (HFpEF) (HCC)    ASSESSMENT AND PLAN:  Tracy Silva is an 59yoF with a PMH of HTN, DM2, chronic LBBB, hypothyroidism, hx rectal cancer s/p colectomy (2018) who presented [facility] ED 12/16/2021 in the early morning hours in respiratory distress requiring BiPAP in the emergency department.  Cardiology is consulted for further assistance characterization of the etiology of her shortness of breath. TTE revealed a new HFrEF (25-30% & global hypo) with moderate aortic stenosis (possibly low flow & severe AS since her EF is  poor) of uncertain chronicity (no priors for comparison)  # Acute hypoxic respiratory failure # Hypertensive urgency On 11/8 her PCP discontinued amlodipine-benazepril & HCTZ, and decreased her dose of metoprolol XL from 50 to 25 mg due to orthostatic hypotension.  She presents with a 2-week history of worsening dyspnea on exertion and exertional chest discomfort was hypoxic to the 80s on room air, requiring CPAP & BIPAP on admission. She was extremely hypertensive (176/96) and tachypneic with CXR demonstrating significant pulmonary edema.  She has been weaned from BiPAP to 6 L by HFNC on 11/21.  -Weaned to room air today. BP better controlled.  # moderate AS, probably low flow (possibly severe AS)  # new HFrEF (25-30%, global hypo) BNP elevated to 1055 with pulmonary edema on CXR and a significant O2 requirement with none at baseline. She has a soft s2 and systolic murmur on exam with radiation to the carotids, suspicious for aortic stenosis. Suspect some compontent of her AS causeing exertional chest pain and worsening DOE - especially with discontinuation of her diuretic and other antihypertensives recently.  -echo complete resulted above with global hypo and significantly reduced EF with moderate AS, query severe AS since her EF is poor.   -she will eventually  need further eval of her AV either with a dobutamine echo or a R/LHC. The timing of this is unclear as she would need transfer to a tertiary facility that performs dobutamine echos vs R/LHC during this admission if her renal function improves vs on an outpatient basis. -s/p IV lasix '60mg'$  x1 & lasix '40mg'$  x 2 with excellent diuresis but bump in Cr.  -Hold diuresis today.  -continue metoprolol XL '25mg'$  once daily  -Add Imdur 10 mg twice daily and hydralazine 10 mg twice daily for afterload reduction -will consider further excalation in GDMT as her BP and renal function allow (ACEi/ARB, MRA, Sglt2i)  -strict I/O, salt restriction   #  NSTEMI # chronic LBBB Troponins elevated and uptrending from 19, 104, 1681, 2256, 2959, 1877 with exertional angina in several days prior to admission. EKG redemonstrates her chronic LBBB without other acute ischemic changes. Trop elevation concerning for NSTEMI with significant delta throughout the morning vs severe demand ischemia.  -s/p '325mg'$  ASA, continue '81mg'$  daily -Continue heparin infusion for 48 hours, ending 11/23 at 1400  -continue metoprolol -continue zetia '10mg'$ , reportedly intolerant of "statins" unclear which ones she has tried (will discuss this further)  -plan to treat conservatively from a cardiac standpoint since the patient's chest pain has resolved as her respiratory status improved and in the setting of worsening renal function.  # CKD 3  Hold further diuresis  Worsening today - Cr. 1.15 and GFR 22, prev 1.8 & 27  # hypothyroidism  TSH elevated at 18, T4 wnl.  -on synthroid 94mg daily, mgmt per primary    This patient's plan of care was discussed and created with Dr. KNehemiah Massedand he is in agreement.  Signed: LTristan Schroeder, PA-C 12/17/2021, 8:44 AM [facility]

## 2021-12-17 NOTE — Consult Note (Signed)
ANTICOAGULATION CONSULT NOTE - Follow Up Consult  Pharmacy Consult for heparin gtt Indication: chest pain/ACS  Allergies  Allergen Reactions   Codeine Other (See Comments)    Headaches Heart palpations   Other     Wool and cashmere    Statins     Muscle pain, joint pain   Adhesive [Tape] Rash    FOR  LONG PERIODS OF TIME FOR  LONG PERIODS OF TIME    Patient Measurements: Height: '5\' 3"'$  (160 cm) Weight: 70.5 kg (155 lb 6.8 oz) IBW/kg (Calculated) : 52.4 Heparin Dosing Weight: 67kg  Vital Signs: Temp: 98.4 F (36.9 C) (11/22 0400) Temp Source: Oral (11/21 2000) BP: 125/75 (11/22 0700) Pulse Rate: 92 (11/22 0700)  Labs: Recent Labs    12/16/21 0419 12/16/21 0620 12/16/21 1117 12/16/21 1256 12/16/21 1336 12/16/21 1553 12/16/21 2211 12/17/21 0617  HGB 9.7*  --   --   --   --   --   --  9.0*  HCT 30.4*  --   --   --   --   --   --  27.8*  PLT 199  --   --   --   --   --   --  184  APTT  --   --   --   --  29  --   --   --   LABPROT  --   --   --   --  14.1  --   --   --   INR  --   --   --   --  1.1  --   --   --   HEPARINUNFRC  --   --   --   --   --   --  0.52 0.57  CREATININE 1.66*  --   --   --   --  1.81*  --  2.15*  TROPONINIHS 19*   < > 1,681* 2,256*  --  2,959*  --   --    < > = values in this interval not displayed.    Estimated Creatinine Clearance: 18.7 mL/min (A) (by C-G formula based on SCr of 2.15 mg/dL (H)). Heparin Dosing Weight: 67kg  Medications:  PTA: No AC PTA Inpatient: HSQ >> heparin gtt  Assessment: [name] w/ h/o T2DM (A1c 6.2%), HTN, hypothyroid, HLD, LBBB, CKD3b (BL Scr 1.6-2) Colon & renal CA, & MDD presenting to ED with c/o CP & dyspnea x2-3days admitted to CCU with development of acute hypoxic respiratory failure ISO new onset Acute CHF vs hypertensive urgency with flash pulmonary edema. Trending tropinemia with interval increase of [id number]; Pharmacy consulted for the mgmt of heparin gtt w/ evolving c/f NSTEMI.    Date Time aPTT/HL Rate/Comment       Baseline Labs: aPTT - pending; INR - pending Hgb - 9.7; Plts - 199 Trop - 235>5732  Goal of Therapy:  Heparin level 0.3-0.7 units/ml Monitor platelets by anticoagulation protocol: Yes   Plan:  11/22 :  HL @ 0617 = 0.57, therapeutic X 2 Will continue pt on current rate and recheck HL on 11/23 with AM labs.   Rashad Obeid D 12/17/2021,7:26 AM

## 2021-12-18 DIAGNOSIS — I5023 Acute on chronic systolic (congestive) heart failure: Secondary | ICD-10-CM

## 2021-12-18 DIAGNOSIS — N1832 Chronic kidney disease, stage 3b: Secondary | ICD-10-CM

## 2021-12-18 DIAGNOSIS — I214 Non-ST elevation (NSTEMI) myocardial infarction: Secondary | ICD-10-CM | POA: Diagnosis not present

## 2021-12-18 LAB — CBC
HCT: 29.6 % — ABNORMAL LOW (ref 36.0–46.0)
Hemoglobin: 9.7 g/dL — ABNORMAL LOW (ref 12.0–15.0)
MCH: 32.8 pg (ref 26.0–34.0)
MCHC: 32.8 g/dL (ref 30.0–36.0)
MCV: 100 fL (ref 80.0–100.0)
Platelets: 200 10*3/uL (ref 150–400)
RBC: 2.96 MIL/uL — ABNORMAL LOW (ref 3.87–5.11)
RDW: 14.1 % (ref 11.5–15.5)
WBC: 5.2 10*3/uL (ref 4.0–10.5)
nRBC: 0 % (ref 0.0–0.2)

## 2021-12-18 LAB — BASIC METABOLIC PANEL
Anion gap: 11 (ref 5–15)
BUN: 50 mg/dL — ABNORMAL HIGH (ref 8–23)
CO2: 24 mmol/L (ref 22–32)
Calcium: 9.3 mg/dL (ref 8.9–10.3)
Chloride: 105 mmol/L (ref 98–111)
Creatinine, Ser: 2.51 mg/dL — ABNORMAL HIGH (ref 0.44–1.00)
GFR, Estimated: 19 mL/min — ABNORMAL LOW (ref >60)
Glucose, Bld: 115 mg/dL — ABNORMAL HIGH (ref 70–99)
Potassium: 3.9 mmol/L (ref 3.5–5.1)
Sodium: 140 mmol/L (ref 135–145)

## 2021-12-18 LAB — GLUCOSE, CAPILLARY
Glucose-Capillary: 113 mg/dL — ABNORMAL HIGH (ref 70–99)
Glucose-Capillary: 114 mg/dL — ABNORMAL HIGH (ref 70–99)
Glucose-Capillary: 135 mg/dL — ABNORMAL HIGH (ref 70–99)
Glucose-Capillary: 136 mg/dL — ABNORMAL HIGH (ref 70–99)
Glucose-Capillary: 258 mg/dL — ABNORMAL HIGH (ref 70–99)
Glucose-Capillary: 42 mg/dL — CL (ref 70–99)
Glucose-Capillary: 53 mg/dL — ABNORMAL LOW (ref 70–99)
Glucose-Capillary: 71 mg/dL (ref 70–99)

## 2021-12-18 LAB — PROCALCITONIN: Procalcitonin: 0.69 ng/mL

## 2021-12-18 LAB — PHOSPHORUS: Phosphorus: 4 mg/dL (ref 2.5–4.6)

## 2021-12-18 LAB — MAGNESIUM: Magnesium: 2.1 mg/dL (ref 1.7–2.4)

## 2021-12-18 LAB — HEPARIN LEVEL (UNFRACTIONATED): Heparin Unfractionated: 0.49 IU/mL (ref 0.30–0.70)

## 2021-12-18 NOTE — TOC Initial Note (Signed)
Transition of Care [facility]) - Initial/Assessment Note    Patient Details  Name: Tracy Silva MRN: [id number] Date of Birth: 07-15-1940  [facility] CM/SW Contact:    Shelbie Hutching, RN Phone Number: 12/18/2021, 12:51 PM  Clinical Narrative:                 RNCM attempted to speak with patient but patient is not feeling well and requests that TOC return at a later time.    Expected Discharge Plan: [place] Barriers to Discharge: Continued Medical Work up   Patient Goals and CMS Choice Patient states their goals for this hospitalization and ongoing recovery are:: patient not feeling well and does not want to talk right now. CMS Medicare.gov Compare Post Acute Care list provided to:: Patient Choice offered to / list presented to : Patient  Expected Discharge Plan and Services Expected Discharge Plan: [place]   Discharge Planning Services: CM Consult Post Acute Care Choice: [place] arrangements for the past 2 months: Single Family Home                                      Prior Living Arrangements/Services Living arrangements for the past 2 months: Single Family Home Lives with:: Spouse Patient language and need for interpreter reviewed:: Yes        Need for Family Participation in Patient Care: Yes (Comment) Care giver support system in place?: Yes (comment)   Criminal Activity/Legal Involvement Pertinent to Current Situation/Hospitalization: No - Comment as needed  Activities of Daily Living Home Assistive Devices/Equipment: None ADL Screening (condition at time of admission) Patient's cognitive ability adequate to safely complete daily activities?: Yes Is the patient deaf or have difficulty hearing?: No Does the patient have difficulty seeing, even when wearing glasses/contacts?: No Does the patient have difficulty concentrating, remembering, or making decisions?: No Patient able to express need  for assistance with ADLs?: Yes Does the patient have difficulty dressing or bathing?: No Independently performs ADLs?: Yes (appropriate for developmental age) Does the patient have difficulty walking or climbing stairs?: Yes Weakness of Legs: Both Weakness of Arms/Hands: None  Permission Sought/Granted                  Emotional Assessment Appearance:: Appears stated age     Orientation: : Oriented to Self, Oriented to Place, Oriented to  Time, Oriented to Situation Alcohol / Substance Use: Not Applicable Psych Involvement: No (comment)  Admission diagnosis:  Acute respiratory failure ([place]) [J96.00] Patient Active Problem List   Diagnosis Date Noted   Acute respiratory failure ([place]) 12/16/2021   Acute heart failure with preserved ejection fraction (HFpEF) ([place]) 12/16/2021   Postmenopausal estrogen deficiency 12/03/2021   Cough in adult 11/03/2021   Right-sided epistaxis 11/03/2021   Acute non-recurrent sinusitis 11/03/2021   Colon cancer ([place]) 08/21/2016   Rectal cancer ([place]) 07/30/2016   Cancer of descending colon ([place]) 07/30/2016   LBBB (left bundle branch block) 07/24/2016   Heart murmur, systolic 12/87/8676   Acid reflux 09/07/2014   AI (aortic incompetence) 09/07/2014   Allergic rhinitis 09/07/2014   Clinical depression 09/07/2014   Diabetes mellitus type 2 in obese ([place]) 09/07/2014   Essential (primary) hypertension 09/07/2014   H/O thyroid disease 09/07/2014   HLD (hyperlipidemia) 09/07/2014   Acquired hypothyroidism 09/07/2014   Adiposity 09/07/2014   Post menopausal syndrome 09/07/2014  Basal cell papilloma 09/07/2014   Avitaminosis D 09/07/2014   PCP:  Eulis Foster, MD Pharmacy:   [place], [place] - [address] [place] [address] Phone: [phone] Fax: [phone]  [place], [place] - [address] [place] [address] Phone: [phone] Fax:  [phone]     Social Determinants of Health (SDOH) Interventions    Readmission Risk Interventions     No data to display

## 2021-12-18 NOTE — Progress Notes (Signed)
PROGRESS NOTE    Tracy Silva  FOY:[id number] DOB: 07-14-1940 DOA: 12/16/2021 PCP: Eulis Foster, MD    Assessment & Plan:   Principal Problem:   Acute respiratory failure ([place]) Active Problems:   Acute heart failure with preserved ejection fraction (HFpEF) ([place])  Assessment and Plan: Acute hypoxic respiratory failure: likely secondary to pulmonary edema, CHF. Resolved   Possibly pulmonary edema: continue to hold lasix as Cr is trending up. Monitor I/Os. No longer requiring supplemental oxygen    Acute systolic CHF: continue on metoprolol, imdur. Holding lasix today. Monitor I/Os  NSTEMI: w/ elevated troponins. Will complete IV heparin x 48 hrs today. Continue on imdur, metoprolol, zeita (unable to tolerate statins). Continue w/ medical management as per cardio. Cardio recs apprec  CKDIIIb: Cr is trending up again today. Will hold lasix. Will continue to monitor   Likely ACD: likely secondary to CKD. H&H are stable   DM2: HbA1c 6.2, well controlled. Continue on SSI w/ accuchecks  Hypothyroidism: continue on home dose of synthroid   HLD: continue on zetia   Generalized weakness: PT recs HH      DVT prophylaxis: SCDs Code Status: full  Family Communication: discussed pt's care w/ pt's daughter at bedside and answered their questions Disposition Plan: likely d/c back home   Level of care: progressive  Status is: Inpatient Remains inpatient appropriate because: severity of illness    Consultants:  Cardio   Procedures:   Antimicrobials:    Subjective: Pt c/o fatigue   Objective: Vitals:   12/18/21 0400 12/18/21 0500 12/18/21 0547 12/18/21 0732  BP: (!) 130/48  (!) 161/78 (!) 110/53  Pulse: 85 87 86 85  Resp: (!) '21 17 18 16  '$ Temp: 98.4 F (36.9 C)  98.4 F (36.9 C) 98 F (36.7 C)  TempSrc:      SpO2: 93% 93% 98% 97%  Weight:  65.9 kg    Height:        Intake/Output Summary (Last 24 hours) at 12/18/2021 0757 Last data filed at  12/18/2021 0601 Gross per 24 hour  Intake 443.19 ml  Output 1835 ml  Net -1391.81 ml   Filed Weights   12/16/21 0426 12/18/21 0500  Weight: 70.5 kg 65.9 kg    Examination:  General exam: Appears comfortable   Respiratory system: diminished breath sounds b/l  Cardiovascular system: S1/S2+. No rubs or clicks. +1 b/l LE pitting edema  Gastrointestinal system: Abd is soft, NT, ND & hypoactive bowel sounds. Central nervous system: Alert and oriented. Moves all extremities  Psychiatry: Judgement and insight appears at baseline. Flat mood and affect     Data Reviewed: I have personally reviewed following labs and imaging studies  CBC: Recent Labs  Lab 12/16/21 0419 12/17/21 0617 12/18/21 0448  WBC 7.6 5.4 5.2  HGB 9.7* 9.0* 9.7*  HCT 30.4* 27.8* 29.6*  MCV 103.4* 100.0 100.0  PLT 199 184 767   Basic Metabolic Panel: Recent Labs  Lab 12/16/21 0419 12/16/21 0620 12/16/21 1311 12/16/21 1553 12/17/21 0617 12/18/21 0448  NA 142  --   --  138 137 140  K 4.0  --  4.4 4.1 3.7 3.9  CL 111  --   --  105 103 105  CO2 22  --   --  '24 26 24  '$ GLUCOSE 254*  --   --  90 97 115*  BUN 33*  --   --  34* 41* 50*  CREATININE 1.66*  --   --  1.81* 2.15*  2.51*  CALCIUM 9.0  --   --  8.8* 9.0 9.3  MG  --  2.2 2.1 2.0 2.0 2.1  PHOS  --  3.5 2.8  --  4.5 4.0   GFR: Estimated Creatinine Clearance: 15.5 mL/min (A) (by C-G formula based on SCr of 2.51 mg/dL (H)). Liver Function Tests: Recent Labs  Lab 12/16/21 0419  AST 25  ALT 14  ALKPHOS 67  BILITOT 0.7  PROT 7.1  ALBUMIN 3.9   No results for input(s): "LIPASE", "AMYLASE" in the last 168 hours. No results for input(s): "AMMONIA" in the last 168 hours. Coagulation Profile: Recent Labs  Lab 12/16/21 1336  INR 1.1   Cardiac Enzymes: No results for input(s): "CKTOTAL", "CKMB", "CKMBINDEX", "TROPONINI" in the last 168 hours. BNP (last 3 results) No results for input(s): "PROBNP" in the last 8760 hours. HbA1C: No results for  input(s): "HGBA1C" in the last 72 hours. CBG: Recent Labs  Lab 12/17/21 1115 12/17/21 1539 12/17/21 1950 12/18/21 0004 12/18/21 0733  GLUCAP 162* 111* 138* 114* 113*   Lipid Profile: No results for input(s): "CHOL", "HDL", "LDLCALC", "TRIG", "CHOLHDL", "LDLDIRECT" in the last 72 hours. Thyroid Function Tests: Recent Labs    12/16/21 0620 12/16/21 1117  TSH 18.474*  --   FREET4  --  0.89  T3FREE  --  1.8*   Anemia Panel: Recent Labs    12/17/21 0617  TIBC 314  IRON 56   Sepsis Labs: Recent Labs  Lab 12/16/21 0419 12/17/21 0617 12/18/21 0448  PROCALCITON <0.10 0.84 0.69    Recent Results (from the past 240 hour(s))  Resp Panel by RT-PCR (Flu A&B, Covid) Anterior Nasal Swab     Status: None   Collection Time: 12/16/21  4:19 AM   Specimen: Anterior Nasal Swab  Result Value Ref Range Status   SARS Coronavirus 2 by RT PCR NEGATIVE NEGATIVE Final    Comment: (NOTE) SARS-CoV-2 target nucleic acids are NOT DETECTED.  The SARS-CoV-2 RNA is generally detectable in upper respiratory specimens during the acute phase of infection. The lowest concentration of SARS-CoV-2 viral copies this assay can detect is 138 copies/mL. A negative result does not preclude SARS-Cov-2 infection and should not be used as the sole basis for treatment or other patient management decisions. A negative result may occur with  improper specimen collection/handling, submission of specimen other than nasopharyngeal swab, presence of viral mutation(s) within the areas targeted by this assay, and inadequate number of viral copies(<138 copies/mL). A negative result must be combined with clinical observations, patient history, and epidemiological information. The expected result is Negative.  Fact Sheet for Patients:  EntrepreneurPulse.com.au  Fact Sheet for Healthcare Providers:  IncredibleEmployment.be  This test is no t yet approved or cleared by the [place] FDA and  has been authorized for detection and/or diagnosis of SARS-CoV-2 by FDA under an Emergency Use Authorization (EUA). This EUA will remain  in effect (meaning this test can be used) for the duration of the COVID-19 declaration under Section 564(b)(1) of the Act, 21 U.S.C.section 360bbb-3(b)(1), unless the authorization is terminated  or revoked sooner.       Influenza A by PCR NEGATIVE NEGATIVE Final   Influenza B by PCR NEGATIVE NEGATIVE Final    Comment: (NOTE) The Xpert Xpress SARS-CoV-2/FLU/RSV plus assay is intended as an aid in the diagnosis of influenza from Nasopharyngeal swab specimens and should not be used as a sole basis for treatment. Nasal washings and aspirates are unacceptable for Xpert Xpress SARS-CoV-2/FLU/RSV  testing.  Fact Sheet for Patients: EntrepreneurPulse.com.au  Fact Sheet for Healthcare Providers: IncredibleEmployment.be  This test is not yet approved or cleared by the [place] FDA and has been authorized for detection and/or diagnosis of SARS-CoV-2 by FDA under an Emergency Use Authorization (EUA). This EUA will remain in effect (meaning this test can be used) for the duration of the COVID-19 declaration under Section 564(b)(1) of the Act, 21 U.S.C. section 360bbb-3(b)(1), unless the authorization is terminated or revoked.  Performed at [facility], [address] [place], [place] [address]   MRSA Next Gen by PCR, Nasal     Status: None   Collection Time: 12/16/21  6:56 AM   Specimen: Nasal Mucosa; Nasal Swab  Result Value Ref Range Status   MRSA by PCR Next Gen NOT DETECTED NOT DETECTED Final    Comment: (NOTE) The GeneXpert MRSA Assay (FDA approved for NASAL specimens only), is one component of a comprehensive MRSA colonization surveillance program. It is not intended to diagnose MRSA infection nor to guide or monitor treatment for MRSA infections. Test performance is not FDA  approved in patients less than 71 years old. Performed at [facility], [address], [place], [place] [address]          Radiology Studies: ECHOCARDIOGRAM COMPLETE  Result Date: 12/16/2021    ECHOCARDIOGRAM REPORT   Patient Name:   Tracy Silva Date of Exam: 12/16/2021 Medical Rec #:  [id number]       Height:       63.0 in Accession #:    [id number]      Weight:       155.4 lb Date of Birth:  [birth year]       BSA:          1.737 m Patient Age:    44 years        BP:           135/62 mmHg Patient Gender: F               HR:           88 bpm. Exam Location:  [facility] Procedure: 2D Echo, Color Doppler and Cardiac Doppler Indications:     I42.9 Cardiomyopathy-unspecified  History:         Patient has no prior history of Echocardiogram examinations.                  Risk Factors:Hypertension, Diabetes and HCL.  Sonographer:     Charmayne Sheer Referring Phys:  [id number] [place] TANG Diagnosing Phys: Serafina Royals MD IMPRESSIONS  1. Left ventricular ejection fraction, by estimation, is 25 to 30%. The left ventricle has severely decreased function. The left ventricle demonstrates global hypokinesis. The left ventricular internal cavity size was moderately dilated. There is moderate left ventricular hypertrophy. Left ventricular diastolic parameters are consistent with Grade I diastolic dysfunction (impaired relaxation).  2. Right ventricular systolic function is normal. The right ventricular size is normal.  3. Left atrial size was mildly dilated.  4. The mitral valve is normal in structure. Mild to moderate mitral valve regurgitation.  5. Tricuspid valve regurgitation is mild to moderate.  6. The aortic valve is calcified. Aortic valve regurgitation is mild. Moderate aortic valve stenosis. FINDINGS  Left Ventricle: Left ventricular ejection fraction, by estimation, is 25 to 30%. The left ventricle has severely decreased function. The left ventricle demonstrates global hypokinesis. The left  ventricular internal cavity size was moderately dilated. There is moderate  left ventricular hypertrophy. Left ventricular diastolic parameters are consistent with Grade I diastolic dysfunction (impaired relaxation). Right Ventricle: The right ventricular size is normal. No increase in right ventricular wall thickness. Right ventricular systolic function is normal. Left Atrium: Left atrial size was mildly dilated. Right Atrium: Right atrial size was normal in size. Pericardium: There is no evidence of pericardial effusion. Mitral Valve: The mitral valve is normal in structure. Mild to moderate mitral valve regurgitation. Tricuspid Valve: The tricuspid valve is normal in structure. Tricuspid valve regurgitation is mild to moderate. Aortic Valve: The aortic valve is calcified. Aortic valve regurgitation is mild. Aortic regurgitation PHT measures 244 msec. Moderate aortic stenosis is present. Aortic valve mean gradient measures 29.0 mmHg. Aortic valve peak gradient measures 46.4 mmHg. Aortic valve area, by VTI measures 0.69 cm. Pulmonic Valve: The pulmonic valve was normal in structure. Pulmonic valve regurgitation is trivial. Aorta: The aortic root and ascending aorta are structurally normal, with no evidence of dilitation. IAS/Shunts: No atrial level shunt detected by color flow Doppler.  LEFT VENTRICLE PLAX 2D LVIDd:         3.70 cm      Diastology LVIDs:         3.50 cm      LV e' medial:    4.13 cm/s LV PW:         0.90 cm      LV E/e' medial:  42.5 LV IVS:        0.90 cm      LV e' lateral:   16.90 cm/s LVOT diam:     1.80 cm      LV E/e' lateral: 10.4 LV SV:         55 LV SV Index:   31 LVOT Area:     2.54 cm  LV Volumes (MOD) LV vol d, MOD A2C: 96.0 ml LV vol d, MOD A4C: 108.0 ml LV vol s, MOD A2C: 59.3 ml LV vol s, MOD A4C: 79.8 ml LV SV MOD A2C:     36.7 ml LV SV MOD A4C:     108.0 ml LV SV MOD BP:      34.4 ml RIGHT VENTRICLE RV Basal diam:  3.30 cm RV S prime:     10.70 cm/s TAPSE (M-mode): 2.1 cm LEFT  ATRIUM             Index        RIGHT ATRIUM           Index LA diam:        3.50 cm 2.01 cm/m   RA Area:     11.30 cm LA Vol (A2C):   41.4 ml 23.83 ml/m  RA Volume:   25.60 ml  14.74 ml/m LA Vol (A4C):   38.8 ml 22.34 ml/m LA Biplane Vol: 40.0 ml 23.03 ml/m  AORTIC VALVE                     PULMONIC VALVE AV Area (Vmax):    0.85 cm      PV Vmax:       2.68 m/s AV Area (Vmean):   0.79 cm      PV Vmean:      207.000 cm/s AV Area (VTI):     0.69 cm      PV VTI:        0.631 m AV Vmax:           340.50 cm/s   PV Peak  grad:  28.7 mmHg AV Vmean:          258.000 cm/s  PV Mean grad:  18.0 mmHg AV VTI:            0.791 m AV Peak Grad:      46.4 mmHg AV Mean Grad:      29.0 mmHg LVOT Vmax:         114.00 cm/s LVOT Vmean:        80.000 cm/s LVOT VTI:          0.215 m LVOT/AV VTI ratio: 0.27 AI PHT:            244 msec  AORTA Ao Root diam: 3.10 cm MITRAL VALVE MV Area (PHT): 5.44 cm     SHUNTS MV Decel Time: 139 msec     Systemic VTI:  0.22 m MV E velocity: 175.33 cm/s  Systemic Diam: 1.80 cm Serafina Royals MD Electronically signed by Serafina Royals MD Signature Date/Time: 12/16/2021/4:52:01 PM    Final         Scheduled Meds:  aspirin  81 mg Oral Daily   Chlorhexidine Gluconate Cloth  6 each Topical Daily   clopidogrel  75 mg Oral Daily   ezetimibe  10 mg Oral q morning   hydrALAZINE  10 mg Oral BID   insulin aspart  0-15 Units Subcutaneous Q4H   isosorbide mononitrate  15 mg Oral BID   levothyroxine  75 mcg Oral Q0600   metoprolol succinate  12.5 mg Oral Daily   pantoprazole (PROTONIX) IV  40 mg Intravenous Q24H   Continuous Infusions:  heparin 850 Units/hr (12/18/21 0601)     LOS: 2 days    Time spent: 33 mins     Wyvonnia Dusky, MD [facility] Pager [phone]  If 7PM-7AM, please contact night-coverage [email or web address] 12/18/2021, 7:57 AM

## 2021-12-18 NOTE — Consult Note (Signed)
ANTICOAGULATION CONSULT NOTE - Follow Up Consult  Pharmacy Consult for heparin gtt Indication: chest pain/ACS  Allergies  Allergen Reactions   Codeine Other (See Comments)    Headaches Heart palpations   Other     Wool and cashmere    Statins     Muscle pain, joint pain   Adhesive [Tape] Rash    FOR  LONG PERIODS OF TIME FOR  LONG PERIODS OF TIME    Patient Measurements: Height: '5\' 3"'$  (160 cm) Weight: 65.9 kg (145 lb 3.2 oz) IBW/kg (Calculated) : 52.4 Heparin Dosing Weight: 67kg  Vital Signs: Temp: 98.4 F (36.9 C) (11/23 0547) Temp Source: Oral (11/22 2000) BP: 161/78 (11/23 0547) Pulse Rate: 86 (11/23 0547)  Labs: Recent Labs    12/16/21 0419 12/16/21 0620 12/16/21 1256 12/16/21 1336 12/16/21 1553 12/16/21 2211 12/17/21 0616 12/17/21 0617 12/18/21 0448  HGB 9.7*  --   --   --   --   --   --  9.0* 9.7*  HCT 30.4*  --   --   --   --   --   --  27.8* 29.6*  PLT 199  --   --   --   --   --   --  184 200  APTT  --   --   --  29  --   --   --   --   --   LABPROT  --   --   --  14.1  --   --   --   --   --   INR  --   --   --  1.1  --   --   --   --   --   HEPARINUNFRC  --   --   --   --   --  0.52  --  0.57 0.49  CREATININE 1.66*  --   --   --  1.81*  --   --  2.15*  --   TROPONINIHS 19*   < > 2,256*  --  2,959*  --  1,877*  --   --    < > = values in this interval not displayed.    Estimated Creatinine Clearance: 18.1 mL/min (A) (by C-G formula based on SCr of 2.15 mg/dL (H)). Heparin Dosing Weight: 67kg  Medications:  PTA: No AC PTA Inpatient: HSQ >> heparin gtt  Assessment: [name] w/ h/o T2DM (A1c 6.2%), HTN, hypothyroid, HLD, LBBB, CKD3b (BL Scr 1.6-2) Colon & renal CA, & MDD presenting to ED with c/o CP & dyspnea x2-3days admitted to CCU with development of acute hypoxic respiratory failure ISO new onset Acute CHF vs hypertensive urgency with flash pulmonary edema. Trending tropinemia with interval increase of [id number]; Pharmacy consulted for the mgmt  of heparin gtt w/ evolving c/f NSTEMI.   Date Time aPTT/HL Rate/Comment       Baseline Labs: aPTT - pending; INR - pending Hgb - 9.7; Plts - 199 Trop - 014>1030  Goal of Therapy:  Heparin level 0.3-0.7 units/ml Monitor platelets by anticoagulation protocol: Yes   Plan:  11/23:  HL @ 0448 = 0.49, therapeutic X 3 Will continue pt on current rate and recheck HL on 11/24 with AM labs.   Makynleigh Breslin D 12/18/2021,6:11 AM

## 2021-12-18 NOTE — Consult Note (Signed)
[place] for Electrolyte Monitoring and Replacement   Recent Labs: Potassium (mmol/L)  Date Value  12/18/2021 3.9  07/01/2012 4.1   Magnesium (mg/dL)  Date Value  12/18/2021 2.1   Calcium (mg/dL)  Date Value  12/18/2021 9.3   Calcium, Total (mg/dL)  Date Value  07/01/2012 9.1   Albumin (g/dL)  Date Value  12/16/2021 3.9  09/30/2021 4.2   Phosphorus (mg/dL)  Date Value  12/18/2021 4.0   Sodium (mmol/L)  Date Value  12/18/2021 140  09/30/2021 144  07/01/2012 137    Assessment: [name] w/ h/o T2DM (A1c 6.2%), HTN, hypothyroid, HLD, LBBB, CKD3b (BL Scr 1.6-2) Colon & renal CA, & MDD presenting to ED with c/o CP & dyspnea x2-3days admitted to CCU with development of acute hypoxic respiratory failure ISO new onset Acute CHF vs hypertensive urgency with flash pulmonary edema. Pharmacy consulted for the mgmt of electrolytes while in CCU  Goal of Therapy:  Electrolytes WNL  Plan:  No electrolyte replacement warranted for today Because this consult was generated as part of an ICU order set and patient is transferring pharmacy will sign off for now Please feel free to reach out if any further assistance is needed  Dallie Piles ,PharmD Clinical Pharmacist 12/18/2021 8:21 AM

## 2021-12-18 NOTE — Progress Notes (Signed)
[facility] Cardiology [facility] Encounter Note  Patient: Tracy Silva / Admit Date: 12/16/2021 / Date of Encounter: 12/18/2021, 5:19 AM   Subjective: Patient has been feeling much better overnight with no evidence of severe shortness of breath this morning.  The patient has had significant amount of diuresis since admission including greater than 3 L.  Patient has had a peak troponin of 1877 consistent with non-ST elevation myocardial infarction multifactorial in nature.  Patient does have significant amount of chronic kidney disease exacerbated at this time with a glomerular filtration rate of 22  Echocardiogram showing moderate LV systolic dysfunction with ejection fraction of 30% and moderate aortic valve stenosis likely exacerbating above symptoms and congestive heart failure  Review of Systems: Positive for: Shortness of breath Negative for: Vision change, hearing change, syncope, dizziness, nausea, vomiting,diarrhea, bloody stool, stomach pain, cough, congestion, diaphoresis, urinary frequency, urinary pain,skin lesions, skin rashes Others previously listed  Objective: Telemetry: Normal sinus rhythm Physical Exam: Blood pressure (!) 130/48, pulse 87, temperature 98.4 F (36.9 C), resp. rate 17, height '5\' 3"'$  (1.6 m), weight 70.5 kg, SpO2 93 %. Body mass index is 27.53 kg/m. General: Well developed, well nourished, in no acute distress. Silva: Normocephalic, atraumatic, sclera non-icteric, no xanthomas, nares are without discharge. Neck: No apparent masses Lungs: Normal respirations with no wheezes, no rhonchi, no rales , no crackles   Heart: Regular rate and rhythm, normal S1 S2, 3+ aortic murmur, no rub, no gallop, PMI is normal size and placement, carotid upstroke normal without bruit, jugular venous pressure normal Abdomen: Soft, non-tender, non-distended with normoactive bowel sounds. No hepatosplenomegaly. Abdominal aorta is normal size without bruit Extremities: Trace  edema, no clubbing, no cyanosis, no ulcers,  Peripheral: 2+ radial, 2+ femoral, 2+ dorsal pedal pulses Neuro: Alert and oriented. Moves all extremities spontaneously. Psych:  Responds to questions appropriately with a normal affect.   Intake/Output Summary (Last 24 hours) at 12/18/2021 0519 Last data filed at 12/18/2021 0400 Gross per 24 hour  Intake 443.05 ml  Output 1910 ml  Net -1466.95 ml    Inpatient Medications:   aspirin  81 mg Oral Daily   Chlorhexidine Gluconate Cloth  6 each Topical Daily   clopidogrel  75 mg Oral Daily   ezetimibe  10 mg Oral q morning   hydrALAZINE  10 mg Oral BID   insulin aspart  0-15 Units Subcutaneous Q4H   isosorbide mononitrate  15 mg Oral BID   levothyroxine  75 mcg Oral Q0600   metoprolol succinate  12.5 mg Oral Daily   pantoprazole (PROTONIX) IV  40 mg Intravenous Q24H   Infusions:   heparin 850 Units/hr (12/18/21 0400)    Labs: Recent Labs    12/16/21 1311 12/16/21 1553 12/17/21 0617  NA  --  138 137  K 4.4 4.1 3.7  CL  --  105 103  CO2  --  24 26  GLUCOSE  --  90 97  BUN  --  34* 41*  CREATININE  --  1.81* 2.15*  CALCIUM  --  8.8* 9.0  MG 2.1 2.0 2.0  PHOS 2.8  --  4.5   Recent Labs    12/16/21 0419  AST 25  ALT 14  ALKPHOS 67  BILITOT 0.7  PROT 7.1  ALBUMIN 3.9   Recent Labs    12/16/21 0419 12/17/21 0617  WBC 7.6 5.4  HGB 9.7* 9.0*  HCT 30.4* 27.8*  MCV 103.4* 100.0  PLT 199 184   No results for  input(s): "CKTOTAL", "CKMB", "TROPONINI" in the last 72 hours. Invalid input(s): "POCBNP" No results for input(s): "HGBA1C" in the last 72 hours.   Weights: Filed Weights   12/16/21 0426  Weight: 70.5 kg     Radiology/Studies:  ECHOCARDIOGRAM COMPLETE  Result Date: 12/16/2021    ECHOCARDIOGRAM REPORT   Patient Name:   Tracy Silva Date of Exam: 12/16/2021 Medical Rec #:  [id number]       Height:       63.0 in Accession #:    [id number]      Weight:       155.4 lb Date of Birth:  October 15, 1940       BSA:           1.737 m Patient Age:    50 years        BP:           135/62 mmHg Patient Gender: F               HR:           88 bpm. Exam Location:  [facility] Procedure: 2D Echo, Color Doppler and Cardiac Doppler Indications:     I42.9 Cardiomyopathy-unspecified  History:         Patient has no prior history of Echocardiogram examinations.                  Risk Factors:Hypertension, Diabetes and HCL.  Sonographer:     Charmayne Sheer Referring Phys:  [id number] [place] TANG Diagnosing Phys: Serafina Royals MD IMPRESSIONS  1. Left ventricular ejection fraction, by estimation, is 25 to 30%. The left ventricle has severely decreased function. The left ventricle demonstrates global hypokinesis. The left ventricular internal cavity size was moderately dilated. There is moderate left ventricular hypertrophy. Left ventricular diastolic parameters are consistent with Grade I diastolic dysfunction (impaired relaxation).  2. Right ventricular systolic function is normal. The right ventricular size is normal.  3. Left atrial size was mildly dilated.  4. The mitral valve is normal in structure. Mild to moderate mitral valve regurgitation.  5. Tricuspid valve regurgitation is mild to moderate.  6. The aortic valve is calcified. Aortic valve regurgitation is mild. Moderate aortic valve stenosis. FINDINGS  Left Ventricle: Left ventricular ejection fraction, by estimation, is 25 to 30%. The left ventricle has severely decreased function. The left ventricle demonstrates global hypokinesis. The left ventricular internal cavity size was moderately dilated. There is moderate left ventricular hypertrophy. Left ventricular diastolic parameters are consistent with Grade I diastolic dysfunction (impaired relaxation). Right Ventricle: The right ventricular size is normal. No increase in right ventricular wall thickness. Right ventricular systolic function is normal. Left Atrium: Left atrial size was mildly dilated. Right Atrium: Right atrial size was  normal in size. Pericardium: There is no evidence of pericardial effusion. Mitral Valve: The mitral valve is normal in structure. Mild to moderate mitral valve regurgitation. Tricuspid Valve: The tricuspid valve is normal in structure. Tricuspid valve regurgitation is mild to moderate. Aortic Valve: The aortic valve is calcified. Aortic valve regurgitation is mild. Aortic regurgitation PHT measures 244 msec. Moderate aortic stenosis is present. Aortic valve mean gradient measures 29.0 mmHg. Aortic valve peak gradient measures 46.4 mmHg. Aortic valve area, by VTI measures 0.69 cm. Pulmonic Valve: The pulmonic valve was normal in structure. Pulmonic valve regurgitation is trivial. Aorta: The aortic root and ascending aorta are structurally normal, with no evidence of dilitation. IAS/Shunts: No atrial level shunt detected by color flow Doppler.  LEFT  VENTRICLE PLAX 2D LVIDd:         3.70 cm      Diastology LVIDs:         3.50 cm      LV e' medial:    4.13 cm/s LV PW:         0.90 cm      LV E/e' medial:  42.5 LV IVS:        0.90 cm      LV e' lateral:   16.90 cm/s LVOT diam:     1.80 cm      LV E/e' lateral: 10.4 LV SV:         55 LV SV Index:   31 LVOT Area:     2.54 cm  LV Volumes (MOD) LV vol d, MOD A2C: 96.0 ml LV vol d, MOD A4C: 108.0 ml LV vol s, MOD A2C: 59.3 ml LV vol s, MOD A4C: 79.8 ml LV SV MOD A2C:     36.7 ml LV SV MOD A4C:     108.0 ml LV SV MOD BP:      34.4 ml RIGHT VENTRICLE RV Basal diam:  3.30 cm RV S prime:     10.70 cm/s TAPSE (M-mode): 2.1 cm LEFT ATRIUM             Index        RIGHT ATRIUM           Index LA diam:        3.50 cm 2.01 cm/m   RA Area:     11.30 cm LA Vol (A2C):   41.4 ml 23.83 ml/m  RA Volume:   25.60 ml  14.74 ml/m LA Vol (A4C):   38.8 ml 22.34 ml/m LA Biplane Vol: 40.0 ml 23.03 ml/m  AORTIC VALVE                     PULMONIC VALVE AV Area (Vmax):    0.85 cm      PV Vmax:       2.68 m/s AV Area (Vmean):   0.79 cm      PV Vmean:      207.000 cm/s AV Area (VTI):     0.69  cm      PV VTI:        0.631 m AV Vmax:           340.50 cm/s   PV Peak grad:  28.7 mmHg AV Vmean:          258.000 cm/s  PV Mean grad:  18.0 mmHg AV VTI:            0.791 m AV Peak Grad:      46.4 mmHg AV Mean Grad:      29.0 mmHg LVOT Vmax:         114.00 cm/s LVOT Vmean:        80.000 cm/s LVOT VTI:          0.215 m LVOT/AV VTI ratio: 0.27 AI PHT:            244 msec  AORTA Ao Root diam: 3.10 cm MITRAL VALVE MV Area (PHT): 5.44 cm     SHUNTS MV Decel Time: 139 msec     Systemic VTI:  0.22 m MV E velocity: 175.33 cm/s  Systemic Diam: 1.80 cm Serafina Royals MD Electronically signed by Serafina Royals MD Signature Date/Time: 12/16/2021/4:52:01 PM    Final    DG Chest  Portable 1 View  Result Date: 12/16/2021 CLINICAL DATA:  81 year old female with chest pain for 3 days and shortness of breath. Retractions. EXAM: PORTABLE CHEST 1 VIEW COMPARISON:  CT Abdomen and Pelvis 07/22/2016. FINDINGS: Portable AP upright view at 0424 hours. Calcified aortic atherosclerosis. Cardiac size appears to remain normal. Indistinct appearance of the bilateral hila. Relatively large lung volumes with widespread bilateral pulmonary interstitial opacity, coarse in the perihilar regions. No superimposed pneumothorax or consolidation. No pleural effusion identified. Combined disc like and pin-like metallic foreign body projects over the midline in the aortic arch region partially obscuring the trachea. Etiology and significance is unclear. No acute osseous abnormality identified. Negative visible bowel gas. IMPRESSION: 1. Widespread bilateral pulmonary interstitial opacity with no definite pleural fluid. Consider Bilateral Pneumonia versus Pulmonary Edema. Viral/atypical infectious etiology possible. 2. Combined disc- and pin-like metallic foreign body projects over the midline in the aortic arch region, etiology and significance unclear. 3. Aortic Atherosclerosis (ICD10-I70.0). Electronically Signed   By: Genevie Ann M.D.   On: 12/16/2021  04:52     Assessment and Recommendation  81 y.o. female with acute systolic dysfunction congestive heart failure multifactorial in nature including LV systolic dysfunction and moderate aortic valve stenosis and combination of acute kidney injury now improved with diuresis as well as having a non-ST elevation myocardial infarction 1.  Discontinuation of diuresis at this point due to significant acute kidney injury and watch for improvements while watching closely for any further pulmonary edema 2.  Continuation of heparin for 24 more hours until further evaluation of above and consideration for further diagnostics 3.  Further evaluation of LV systolic dysfunction aortic valve stenosis if necessary after improvements acute kidney injury 4.  Addition of beta-blocker for LV systolic dysfunction but hold any ACE inhibitor at this time 5.  Patient okay for transfer to telemetry Signed, Serafina Royals M.D. FACC

## 2021-12-19 DIAGNOSIS — I214 Non-ST elevation (NSTEMI) myocardial infarction: Secondary | ICD-10-CM | POA: Diagnosis not present

## 2021-12-19 DIAGNOSIS — I5023 Acute on chronic systolic (congestive) heart failure: Secondary | ICD-10-CM | POA: Diagnosis not present

## 2021-12-19 DIAGNOSIS — N1832 Chronic kidney disease, stage 3b: Secondary | ICD-10-CM | POA: Diagnosis not present

## 2021-12-19 LAB — CBC
HCT: 27.7 % — ABNORMAL LOW (ref 36.0–46.0)
Hemoglobin: 9.1 g/dL — ABNORMAL LOW (ref 12.0–15.0)
MCH: 32.7 pg (ref 26.0–34.0)
MCHC: 32.9 g/dL (ref 30.0–36.0)
MCV: 99.6 fL (ref 80.0–100.0)
Platelets: 198 10*3/uL (ref 150–400)
RBC: 2.78 MIL/uL — ABNORMAL LOW (ref 3.87–5.11)
RDW: 13.9 % (ref 11.5–15.5)
WBC: 4.8 10*3/uL (ref 4.0–10.5)
nRBC: 0 % (ref 0.0–0.2)

## 2021-12-19 LAB — BASIC METABOLIC PANEL
Anion gap: 10 (ref 5–15)
BUN: 48 mg/dL — ABNORMAL HIGH (ref 8–23)
CO2: 25 mmol/L (ref 22–32)
Calcium: 9.4 mg/dL (ref 8.9–10.3)
Chloride: 105 mmol/L (ref 98–111)
Creatinine, Ser: 1.93 mg/dL — ABNORMAL HIGH (ref 0.44–1.00)
GFR, Estimated: 25 mL/min — ABNORMAL LOW (ref >60)
Glucose, Bld: 104 mg/dL — ABNORMAL HIGH (ref 70–99)
Potassium: 3.7 mmol/L (ref 3.5–5.1)
Sodium: 140 mmol/L (ref 135–145)

## 2021-12-19 LAB — GLUCOSE, CAPILLARY
Glucose-Capillary: 100 mg/dL — ABNORMAL HIGH (ref 70–99)
Glucose-Capillary: 107 mg/dL — ABNORMAL HIGH (ref 70–99)
Glucose-Capillary: 107 mg/dL — ABNORMAL HIGH (ref 70–99)
Glucose-Capillary: 120 mg/dL — ABNORMAL HIGH (ref 70–99)
Glucose-Capillary: 139 mg/dL — ABNORMAL HIGH (ref 70–99)
Glucose-Capillary: 92 mg/dL (ref 70–99)

## 2021-12-19 LAB — HEPARIN LEVEL (UNFRACTIONATED): Heparin Unfractionated: 0.44 IU/mL (ref 0.30–0.70)

## 2021-12-19 MED ORDER — METOPROLOL TARTRATE 50 MG PO TABS
50.0000 mg | ORAL_TABLET | Freq: Two times a day (BID) | ORAL | Status: DC
  Administered 2021-12-19 – 2021-12-22 (×6): 50 mg via ORAL
  Filled 2021-12-19 (×6): qty 1

## 2021-12-19 NOTE — Progress Notes (Signed)
[facility] Cardiology [facility] Encounter Note  Patient: Tracy Silva / Admit Date: 12/16/2021 / Date of Encounter: 12/19/2021, 4:59 PM   Subjective: 11/23.  Patient has been feeling much better overnight with no evidence of severe shortness of breath this morning.  The patient has had significant amount of diuresis since admission including greater than 3 L.  Patient has had a peak troponin of 1877 consistent with non-ST elevation myocardial infarction multifactorial in nature.  Patient does have significant amount of chronic kidney disease exacerbated at this time with a glomerular filtration rate of 22  11/24.  Patient is ambulating in the room without evidence of significant issues of shortness of breath chest pain or other concerns.  There has been no evidence of lower extremity edema.  Pulmonary edema resolved.  Patient has been on appropriate medication management and with further adjustments may have continued his improvements with congestive heart failure LV systolic dysfunction and moderate to severe aortic valve stenosis.  There will be no further intervention at this time for non-ST elevation myocardial infarction which appears to be demand ischemia  Echocardiogram showing moderate LV systolic dysfunction with ejection fraction of 30% and moderate aortic valve stenosis likely exacerbating above symptoms and congestive heart failure  Review of Systems: Positive for: Shortness of breath Negative for: Vision change, hearing change, syncope, dizziness, nausea, vomiting,diarrhea, bloody stool, stomach pain, cough, congestion, diaphoresis, urinary frequency, urinary pain,skin lesions, skin rashes Others previously listed  Objective: Telemetry: Normal sinus rhythm Physical Exam: Blood pressure 132/69, pulse 85, temperature 98.2 F (36.8 C), temperature source Oral, resp. rate 18, height '5\' 3"'$  (1.6 m), weight 65.9 kg, SpO2 100 %. Body mass index is 25.72 kg/m. General: Well  developed, well nourished, in no acute distress. Head: Normocephalic, atraumatic, sclera non-icteric, no xanthomas, nares are without discharge. Neck: No apparent masses Lungs: Normal respirations with no wheezes, no rhonchi, no rales , no crackles   Heart: Regular rate and rhythm, normal S1 S2, 3+ aortic murmur, no rub, no gallop, PMI is normal size and placement, carotid upstroke normal without bruit, jugular venous pressure normal Abdomen: Soft, non-tender, non-distended with normoactive bowel sounds. No hepatosplenomegaly. Abdominal aorta is normal size without bruit Extremities: Trace edema, no clubbing, no cyanosis, no ulcers,  Peripheral: 2+ radial, 2+ femoral, 2+ dorsal pedal pulses Neuro: Alert and oriented. Moves all extremities spontaneously. Psych:  Responds to questions appropriately with a normal affect.   Intake/Output Summary (Last 24 hours) at 12/19/2021 1659 Last data filed at 12/19/2021 0900 Gross per 24 hour  Intake 322.17 ml  Output --  Net 322.17 ml     Inpatient Medications:   aspirin  81 mg Oral Daily   clopidogrel  75 mg Oral Daily   ezetimibe  10 mg Oral q morning   hydrALAZINE  10 mg Oral BID   insulin aspart  0-15 Units Subcutaneous Q4H   isosorbide mononitrate  15 mg Oral BID   levothyroxine  75 mcg Oral Q0600   metoprolol succinate  12.5 mg Oral Daily   pantoprazole (PROTONIX) IV  40 mg Intravenous Q24H   Infusions:     Labs: Recent Labs    12/17/21 0617 12/18/21 0448 12/19/21 0428  NA 137 140 140  K 3.7 3.9 3.7  CL 103 105 105  CO2 '26 24 25  '$ GLUCOSE 97 115* 104*  BUN 41* 50* 48*  CREATININE 2.15* 2.51* 1.93*  CALCIUM 9.0 9.3 9.4  MG 2.0 2.1  --   PHOS 4.5 4.0  --  No results for input(s): "AST", "ALT", "ALKPHOS", "BILITOT", "PROT", "ALBUMIN" in the last 72 hours.  Recent Labs    12/18/21 0448 12/19/21 0428  WBC 5.2 4.8  HGB 9.7* 9.1*  HCT 29.6* 27.7*  MCV 100.0 99.6  PLT 200 198    No results for input(s): "CKTOTAL",  "CKMB", "TROPONINI" in the last 72 hours. Invalid input(s): "POCBNP" No results for input(s): "HGBA1C" in the last 72 hours.   Weights: Filed Weights   12/16/21 0426 12/18/21 0500  Weight: 70.5 kg 65.9 kg     Radiology/Studies:  ECHOCARDIOGRAM COMPLETE  Result Date: 12/16/2021    ECHOCARDIOGRAM REPORT   Patient Name:   Tracy Silva Date of Exam: 12/16/2021 Medical Rec #:  [id number]       Height:       63.0 in Accession #:    [id number]      Weight:       155.4 lb Date of Birth:  03/07/1940 (81 years)       BSA:          1.737 m Patient Age:    81 years        BP:           135/62 mmHg Patient Gender: F               HR:           88 bpm. Exam Location:  [facility] Procedure: 2D Echo, Color Doppler and Cardiac Doppler Indications:     I42.9 Cardiomyopathy-unspecified  History:         Patient has no prior history of Echocardiogram examinations.                  Risk Factors:Hypertension, Diabetes and HCL.  Sonographer:     Charmayne Sheer Referring Phys:  [id number] [place] TANG Diagnosing Phys: Serafina Royals MD IMPRESSIONS  1. Left ventricular ejection fraction, by estimation, is 25 to 30%. The left ventricle has severely decreased function. The left ventricle demonstrates global hypokinesis. The left ventricular internal cavity size was moderately dilated. There is moderate left ventricular hypertrophy. Left ventricular diastolic parameters are consistent with Grade I diastolic dysfunction (impaired relaxation).  2. Right ventricular systolic function is normal. The right ventricular size is normal.  3. Left atrial size was mildly dilated.  4. The mitral valve is normal in structure. Mild to moderate mitral valve regurgitation.  5. Tricuspid valve regurgitation is mild to moderate.  6. The aortic valve is calcified. Aortic valve regurgitation is mild. Moderate aortic valve stenosis. FINDINGS  Left Ventricle: Left ventricular ejection fraction, by estimation, is 25 to 30%. The left ventricle has severely decreased  function. The left ventricle demonstrates global hypokinesis. The left ventricular internal cavity size was moderately dilated. There is moderate left ventricular hypertrophy. Left ventricular diastolic parameters are consistent with Grade I diastolic dysfunction (impaired relaxation). Right Ventricle: The right ventricular size is normal. No increase in right ventricular wall thickness. Right ventricular systolic function is normal. Left Atrium: Left atrial size was mildly dilated. Right Atrium: Right atrial size was normal in size. Pericardium: There is no evidence of pericardial effusion. Mitral Valve: The mitral valve is normal in structure. Mild to moderate mitral valve regurgitation. Tricuspid Valve: The tricuspid valve is normal in structure. Tricuspid valve regurgitation is mild to moderate. Aortic Valve: The aortic valve is calcified. Aortic valve regurgitation is mild. Aortic regurgitation PHT measures 244 msec. Moderate aortic stenosis is present. Aortic valve mean gradient measures 29.0 mmHg. Aortic  valve peak gradient measures 46.4 mmHg. Aortic valve area, by VTI measures 0.69 cm. Pulmonic Valve: The pulmonic valve was normal in structure. Pulmonic valve regurgitation is trivial. Aorta: The aortic root and ascending aorta are structurally normal, with no evidence of dilitation. IAS/Shunts: No atrial level shunt detected by color flow Doppler.  LEFT VENTRICLE PLAX 2D LVIDd:         3.70 cm      Diastology LVIDs:         3.50 cm      LV e' medial:    4.13 cm/s LV PW:         0.90 cm      LV E/e' medial:  42.5 LV IVS:        0.90 cm      LV e' lateral:   16.90 cm/s LVOT diam:     1.80 cm      LV E/e' lateral: 10.4 LV SV:         55 LV SV Index:   31 LVOT Area:     2.54 cm  LV Volumes (MOD) LV vol d, MOD A2C: 96.0 ml LV vol d, MOD A4C: 108.0 ml LV vol s, MOD A2C: 59.3 ml LV vol s, MOD A4C: 79.8 ml LV SV MOD A2C:     36.7 ml LV SV MOD A4C:     108.0 ml LV SV MOD BP:      34.4 ml RIGHT VENTRICLE RV Basal  diam:  3.30 cm RV S prime:     10.70 cm/s TAPSE (M-mode): 2.1 cm LEFT ATRIUM             Index        RIGHT ATRIUM           Index LA diam:        3.50 cm 2.01 cm/m   RA Area:     11.30 cm LA Vol (A2C):   41.4 ml 23.83 ml/m  RA Volume:   25.60 ml  14.74 ml/m LA Vol (A4C):   38.8 ml 22.34 ml/m LA Biplane Vol: 40.0 ml 23.03 ml/m  AORTIC VALVE                     PULMONIC VALVE AV Area (Vmax):    0.85 cm      PV Vmax:       2.68 m/s AV Area (Vmean):   0.79 cm      PV Vmean:      207.000 cm/s AV Area (VTI):     0.69 cm      PV VTI:        0.631 m AV Vmax:           340.50 cm/s   PV Peak grad:  28.7 mmHg AV Vmean:          258.000 cm/s  PV Mean grad:  18.0 mmHg AV VTI:            0.791 m AV Peak Grad:      46.4 mmHg AV Mean Grad:      29.0 mmHg LVOT Vmax:         114.00 cm/s LVOT Vmean:        80.000 cm/s LVOT VTI:          0.215 m LVOT/AV VTI ratio: 0.27 AI PHT:            244 msec  AORTA Ao Root diam: 3.10 cm MITRAL VALVE MV  Area (PHT): 5.44 cm     SHUNTS MV Decel Time: 139 msec     Systemic VTI:  0.22 m MV E velocity: 175.33 cm/s  Systemic Diam: 1.80 cm Serafina Royals MD Electronically signed by Serafina Royals MD Signature Date/Time: 12/16/2021/4:52:01 PM    Final    DG Chest Portable 1 View  Result Date: 12/16/2021 CLINICAL DATA:  81 year old female with chest pain for 3 days and shortness of breath. Retractions. EXAM: PORTABLE CHEST 1 VIEW COMPARISON:  CT Abdomen and Pelvis 07/22/2016. FINDINGS: Portable AP upright view at 0424 hours. Calcified aortic atherosclerosis. Cardiac size appears to remain normal. Indistinct appearance of the bilateral hila. Relatively large lung volumes with widespread bilateral pulmonary interstitial opacity, coarse in the perihilar regions. No superimposed pneumothorax or consolidation. No pleural effusion identified. Combined disc like and pin-like metallic foreign body projects over the midline in the aortic arch region partially obscuring the trachea. Etiology and  significance is unclear. No acute osseous abnormality identified. Negative visible bowel gas. IMPRESSION: 1. Widespread bilateral pulmonary interstitial opacity with no definite pleural fluid. Consider Bilateral Pneumonia versus Pulmonary Edema. Viral/atypical infectious etiology possible. 2. Combined disc- and pin-like metallic foreign body projects over the midline in the aortic arch region, etiology and significance unclear. 3. Aortic Atherosclerosis (ICD10-I70.0). Electronically Signed   By: Genevie Ann M.D.   On: 12/16/2021 04:52     Assessment and Recommendation  81 y.o. female with acute systolic dysfunction congestive heart failure multifactorial in nature including LV systolic dysfunction and moderate aortic valve stenosis and combination of acute kidney injury now improved with diuresis as well as having a non-ST elevation myocardial infarction 1.  Abstain from  diuresis at this point due to significant acute kidney injury and watch for improvements while watching closely for any further pulmonary edema well patient has recovery from acute on chronic kidney disease 2.  Discontinuation of heparin at this time using aspirin and Plavix as necessary 3.  Further evaluation of LV systolic dysfunction aortic valve stenosis if necessary after improvements acute kidney injury either as outpatient or inpatient depending on next 24 hours 4.  Addition of beta-blocker for LV systolic dysfunction but hold any ACE inhibitor at this time 5.  Continuation of ambulation following for any further significant symptoms Signed, Serafina Royals M.D. FACC

## 2021-12-19 NOTE — Consult Note (Signed)
ANTICOAGULATION CONSULT NOTE - Follow Up Consult  Pharmacy Consult for heparin gtt Indication: chest pain/ACS  Allergies  Allergen Reactions   Codeine Other (See Comments)    Headaches Heart palpations   Other     Wool and cashmere    Statins     Muscle pain, joint pain   Adhesive [Tape] Rash    FOR  LONG PERIODS OF TIME FOR  LONG PERIODS OF TIME    Patient Measurements: Height: '5\' 3"'$  (160 cm) Weight: 65.9 kg (145 lb 3.2 oz) IBW/kg (Calculated) : 52.4 Heparin Dosing Weight: 67kg  Vital Signs: Temp: 98.6 F (37 C) (11/23 2117) Temp Source: Oral (11/23 2117) BP: 125/70 (11/23 2117) Pulse Rate: 81 (11/23 2117)  Labs: Recent Labs     0000 12/16/21 1256 12/16/21 1336 12/16/21 1553 12/16/21 2211 12/17/21 0616 12/17/21 0617 12/18/21 0448 12/19/21 0428  HGB  --   --   --   --    < >  --  9.0* 9.7* 9.1*  HCT  --   --   --   --   --   --  27.8* 29.6* 27.7*  PLT  --   --   --   --   --   --  184 200 198  APTT  --   --  29  --   --   --   --   --   --   LABPROT  --   --  14.1  --   --   --   --   --   --   INR  --   --  1.1  --   --   --   --   --   --   HEPARINUNFRC  --   --   --   --    < >  --  0.57 0.49 0.44  CREATININE   < >  --   --  1.81*  --   --  2.15* 2.51* 1.93*  TROPONINIHS  --  2,256*  --  2,959*  --  1,877*  --   --   --    < > = values in this interval not displayed.    Estimated Creatinine Clearance: 20.2 mL/min (A) (by C-G formula based on SCr of 1.93 mg/dL (H)). Heparin Dosing Weight: 67kg  Medications:  PTA: No AC PTA Inpatient: HSQ >> heparin gtt  Assessment: [name] w/ h/o T2DM (A1c 6.2%), HTN, hypothyroid, HLD, LBBB, CKD3b (BL Scr 1.6-2) Colon & renal CA, & MDD presenting to ED with c/o CP & dyspnea x2-3days admitted to CCU with development of acute hypoxic respiratory failure ISO new onset Acute CHF vs hypertensive urgency with flash pulmonary edema. Trending tropinemia with interval increase of [id number]; Pharmacy consulted for the mgmt of  heparin gtt w/ evolving c/f NSTEMI.   Date Time aPTT/HL Rate/Comment       Baseline Labs: aPTT - pending; INR - pending Hgb - 9.7; Plts - 199 Trop - 481>8563  Goal of Therapy:  Heparin level 0.3-0.7 units/ml Monitor platelets by anticoagulation protocol: Yes   Plan:  11/24:  HL @ 0428 = 0.44, therapeutic X 4  Will continue pt on current rate and recheck HL on 11/25 with AM labs.   Tykiera Raven D 12/19/2021,5:20 AM

## 2021-12-19 NOTE — Progress Notes (Signed)
PROGRESS NOTE    Tracy Silva  ESP:[id number] DOB: 07-01-1940 DOA: 12/16/2021 PCP: Eulis Foster, MD    Assessment & Plan:   Principal Problem:   Acute respiratory failure ([place]) Active Problems:   Acute heart failure with preserved ejection fraction (HFpEF) ([place])  Assessment and Plan: Acute hypoxic respiratory failure: likely secondary to pulmonary edema, CHF. Resolved   Possibly pulmonary edema: holding lasix. Cr is trending down today. No longer requiring supplemental oxygen    Acute combined CHF: continue on imdur, metoprolol. Holding lasix. Monitor I/Os. Echo shows EF 63-33%, grade I diastolic dysfunction, LV demonstrates global hypokinesis, mod AS   NSTEMI: w/ elevated troponins. IV heparin d/c today as per cardio. Continue on metoprolol, imdur, plavix & zetia (unable to tolerate statins). Continue w/ medical management as per cardio    CKDIIIb: Cr is trending down today   Likely ACD: likely secondary to CKD. H&H are labile    DM2: well controlled, HbA1c 6.2. Continue on SSI w/ accuchecks   Hypothyroidism: continue on home dose of synthroid   HLD: continue on zetia   Generalized weakness: PT recs HH        DVT prophylaxis: SCDs Code Status: full  Family Communication: discussed pt's care w/ pt's daughter at bedside and answered their questions Disposition Plan: likely d/c back home   Level of care: progressive  Status is: Inpatient Remains inpatient appropriate because: severity of illness    Consultants:  Cardio   Procedures:   Antimicrobials:    Subjective: Pt c/o malaise   Objective: Vitals:   12/18/21 1649 12/18/21 2117 12/19/21 0558 12/19/21 0700  BP: 108/65 125/70 117/70 127/81  Pulse: 77 81 (!) 102 91  Resp:  '18 18 16  '$ Temp: 97.6 F (36.4 C) 98.6 F (37 C) 98.4 F (36.9 C) 98 F (36.7 C)  TempSrc:  Oral    SpO2: 98% 96% 96% 96%  Weight:      Height:        Intake/Output Summary (Last 24 hours) at 12/19/2021  0806 Last data filed at 12/19/2021 0117 Gross per 24 hour  Intake 163.77 ml  Output --  Net 163.77 ml   Filed Weights   12/16/21 0426 12/18/21 0500  Weight: 70.5 kg 65.9 kg    Examination:  General exam: Appears calm & comfortable  Respiratory system: decreased breath sounds b/l  Cardiovascular system: S1 & S2+.  +1 b/l LE pitting edema  Gastrointestinal system: Abd is soft, NT, ND & hypoactive bowel sounds  Central nervous system: alert and oriented. Moves all extremties  Psychiatry: Judgement and insight appears at baseline. Flat mood and affect     Data Reviewed: I have personally reviewed following labs and imaging studies  CBC: Recent Labs  Lab 12/16/21 0419 12/17/21 0617 12/18/21 0448 12/19/21 0428  WBC 7.6 5.4 5.2 4.8  HGB 9.7* 9.0* 9.7* 9.1*  HCT 30.4* 27.8* 29.6* 27.7*  MCV 103.4* 100.0 100.0 99.6  PLT 199 184 200 545   Basic Metabolic Panel: Recent Labs  Lab 12/16/21 0419 12/16/21 0620 12/16/21 1311 12/16/21 1553 12/17/21 0617 12/18/21 0448 12/19/21 0428  NA 142  --   --  138 137 140 140  K 4.0  --  4.4 4.1 3.7 3.9 3.7  CL 111  --   --  105 103 105 105  CO2 22  --   --  '24 26 24 25  '$ GLUCOSE 254*  --   --  90 97 115* 104*  BUN 33*  --   --  34* 41* 50* 48*  CREATININE 1.66*  --   --  1.81* 2.15* 2.51* 1.93*  CALCIUM 9.0  --   --  8.8* 9.0 9.3 9.4  MG  --  2.2 2.1 2.0 2.0 2.1  --   PHOS  --  3.5 2.8  --  4.5 4.0  --    GFR: Estimated Creatinine Clearance: 20.2 mL/min (A) (by C-G formula based on SCr of 1.93 mg/dL (H)). Liver Function Tests: Recent Labs  Lab 12/16/21 0419  AST 25  ALT 14  ALKPHOS 67  BILITOT 0.7  PROT 7.1  ALBUMIN 3.9   No results for input(s): "LIPASE", "AMYLASE" in the last 168 hours. No results for input(s): "AMMONIA" in the last 168 hours. Coagulation Profile: Recent Labs  Lab 12/16/21 1336  INR 1.1   Cardiac Enzymes: No results for input(s): "CKTOTAL", "CKMB", "CKMBINDEX", "TROPONINI" in the last 168  hours. BNP (last 3 results) No results for input(s): "PROBNP" in the last 8760 hours. HbA1C: No results for input(s): "HGBA1C" in the last 72 hours. CBG: Recent Labs  Lab 12/18/21 1400 12/18/21 1650 12/18/21 2114 12/19/21 0024 12/19/21 0407  GLUCAP 71 135* 136* 100* 107*   Lipid Profile: No results for input(s): "CHOL", "HDL", "LDLCALC", "TRIG", "CHOLHDL", "LDLDIRECT" in the last 72 hours. Thyroid Function Tests: Recent Labs    12/16/21 1117  FREET4 0.89  T3FREE 1.8*   Anemia Panel: Recent Labs    12/17/21 0617  TIBC 314  IRON 56   Sepsis Labs: Recent Labs  Lab 12/16/21 0419 12/17/21 0617 12/18/21 0448  PROCALCITON <0.10 0.84 0.69    Recent Results (from the past 240 hour(s))  Resp Panel by RT-PCR (Flu A&B, Covid) Anterior Nasal Swab     Status: None   Collection Time: 12/16/21  4:19 AM   Specimen: Anterior Nasal Swab  Result Value Ref Range Status   SARS Coronavirus 2 by RT PCR NEGATIVE NEGATIVE Final    Comment: (NOTE) SARS-CoV-2 target nucleic acids are NOT DETECTED.  The SARS-CoV-2 RNA is generally detectable in upper respiratory specimens during the acute phase of infection. The lowest concentration of SARS-CoV-2 viral copies this assay can detect is 138 copies/mL. A negative result does not preclude SARS-Cov-2 infection and should not be used as the sole basis for treatment or other patient management decisions. A negative result may occur with  improper specimen collection/handling, submission of specimen other than nasopharyngeal swab, presence of viral mutation(s) within the areas targeted by this assay, and inadequate number of viral copies(<138 copies/mL). A negative result must be combined with clinical observations, patient history, and epidemiological information. The expected result is Negative.  Fact Sheet for Patients:  EntrepreneurPulse.com.au  Fact Sheet for Healthcare Providers:   IncredibleEmployment.be  This test is no t yet approved or cleared by the [place] FDA and  has been authorized for detection and/or diagnosis of SARS-CoV-2 by FDA under an Emergency Use Authorization (EUA). This EUA will remain  in effect (meaning this test can be used) for the duration of the COVID-19 declaration under Section 564(b)(1) of the Act, 21 U.S.C.section 360bbb-3(b)(1), unless the authorization is terminated  or revoked sooner.       Influenza A by PCR NEGATIVE NEGATIVE Final   Influenza B by PCR NEGATIVE NEGATIVE Final    Comment: (NOTE) The Xpert Xpress SARS-CoV-2/FLU/RSV plus assay is intended as an aid in the diagnosis of influenza from Nasopharyngeal swab specimens and should not be used as a sole basis for treatment. Nasal  washings and aspirates are unacceptable for Xpert Xpress SARS-CoV-2/FLU/RSV testing.  Fact Sheet for Patients: EntrepreneurPulse.com.au  Fact Sheet for Healthcare Providers: IncredibleEmployment.be  This test is not yet approved or cleared by the [place] FDA and has been authorized for detection and/or diagnosis of SARS-CoV-2 by FDA under an Emergency Use Authorization (EUA). This EUA will remain in effect (meaning this test can be used) for the duration of the COVID-19 declaration under Section 564(b)(1) of the Act, 21 U.S.C. section 360bbb-3(b)(1), unless the authorization is terminated or revoked.  Performed at [facility], [address] [place], [place] [address]   MRSA Next Gen by PCR, Nasal     Status: None   Collection Time: 12/16/21  6:56 AM   Specimen: Nasal Mucosa; Nasal Swab  Result Value Ref Range Status   MRSA by PCR Next Gen NOT DETECTED NOT DETECTED Final    Comment: (NOTE) The GeneXpert MRSA Assay (FDA approved for NASAL specimens only), is one component of a comprehensive MRSA colonization surveillance program. It is not intended to  diagnose MRSA infection nor to guide or monitor treatment for MRSA infections. Test performance is not FDA approved in patients less than 21 years old. Performed at [facility], [address], [place], [place] [address]          Radiology Studies: No results found.      Scheduled Meds:  aspirin  81 mg Oral Daily   clopidogrel  75 mg Oral Daily   ezetimibe  10 mg Oral q morning   hydrALAZINE  10 mg Oral BID   insulin aspart  0-15 Units Subcutaneous Q4H   isosorbide mononitrate  15 mg Oral BID   levothyroxine  75 mcg Oral Q0600   metoprolol succinate  12.5 mg Oral Daily   pantoprazole (PROTONIX) IV  40 mg Intravenous Q24H   Continuous Infusions:  heparin 850 Units/hr (12/19/21 0448)     LOS: 3 days    Time spent: 30 mins     Wyvonnia Dusky, MD [facility] Pager [phone]  If 7PM-7AM, please contact night-coverage [email or web address] 12/19/2021, 8:06 AM

## 2021-12-19 NOTE — Progress Notes (Signed)
Physical Therapy Treatment Patient Details Name: Tracy Silva MRN: [id number] DOB: [birth year] Today's Date: 12/19/2021   History of Present Illness 13yoF with a PMH of HTN, DM2, chronic LBBB, hypothyroidism, hx rectal cancer s/p colectomy (2018) who presented to [facility] ED 12/16/2021 in the early morning hours in respiratory distress requiring BiPAP in the emergency department.  Cardiology is consulted for further assistance characterization of the etiology of her shortness of breath, query new CHF versus hypertensive urgency    PT Comments    Pt is making gradual progress towards goals, limited in ambulation distance due to fatigue. HR and O2 sats WNL. Safe use of RW, continue to recommend for use with ambulation. Reports she has been ambulatory with RN staff to bathroom, educated to discontinue purewick. Agreeable to sit in recliner. Will continue to progress.  Recommendations for follow up therapy are one component of a multi-disciplinary discharge planning process, led by the attending physician.  Recommendations may be updated based on patient status, additional functional criteria and insurance authorization.  Follow Up Recommendations  Home health PT     Assistance Recommended at Discharge Intermittent Supervision/Assistance  Patient can return home with the following A little help with walking and/or transfers;A little help with bathing/dressing/bathroom;Assistance with cooking/housework;Assist for transportation   Equipment Recommendations  Rolling walker (2 wheels)    Recommendations for Other Services       Precautions / Restrictions Precautions Precautions: Fall Restrictions Weight Bearing Restrictions: No     Mobility  Bed Mobility Overal bed mobility: Modified Independent             General bed mobility comments: labored with transition to sitting at EOB. Once seated, upright posture noted.    Transfers Overall transfer level: Needs assistance Equipment  used: Rolling walker (2 wheels) Transfers: Sit to/from Stand Sit to Stand: Min guard           General transfer comment: cues for hand position. Once standing, reports she got slightly dizzy. Symptoms improve quickly.    Ambulation/Gait Ambulation/Gait assistance: Supervision Gait Distance (Feet): 110 Feet Assistive device: Rolling walker (2 wheels) Gait Pattern/deviations: Step-through pattern       General Gait Details: ambulated with slow gait pattern. RW used with a few standing rest breaks. ALl mobility on RA with sats at 97% and HR increasing to 116bpm with exertion. Self limiting with distance secondary to fatigue.   Stairs             Wheelchair Mobility    Modified Rankin (Stroke Patients Only)       Balance Overall balance assessment: Needs assistance Sitting-balance support: Feet supported Sitting balance-Leahy Scale: Good     Standing balance support: Reliant on assistive device for balance, During functional activity, Bilateral upper extremity supported Standing balance-Leahy Scale: Good                              Cognition Arousal/Alertness: Awake/alert Behavior During Therapy: WFL for tasks assessed/performed Overall Cognitive Status: Within Functional Limits for tasks assessed                                 General Comments: pleasant and agreeable to session        Exercises      General Comments        Pertinent Vitals/Pain Pain Assessment Pain Assessment: No/denies pain    Home Living  Prior Function            PT Goals (current goals can now be found in the care plan section) Acute Rehab PT Goals Patient Stated Goal: not go home too soon PT Goal Formulation: With patient/family Time For Goal Achievement: 12/30/21 Potential to Achieve Goals: Fair Progress towards PT goals: Progressing toward goals    Frequency    Min 2X/week      PT Plan Current  plan remains appropriate    Co-evaluation              AM-PAC PT "6 Clicks" Mobility   Outcome Measure  Help needed turning from your back to your side while in a flat bed without using bedrails?: None Help needed moving from lying on your back to sitting on the side of a flat bed without using bedrails?: None Help needed moving to and from a bed to a chair (including a wheelchair)?: A Little Help needed standing up from a chair using your arms (e.g., wheelchair or bedside chair)?: A Little Help needed to walk in hospital room?: A Little Help needed climbing 3-5 steps with a railing? : A Little 6 Click Score: 20    End of Session Equipment Utilized During Treatment: Gait belt Activity Tolerance: Patient limited by fatigue Patient left: in chair;with chair alarm set Nurse Communication: Mobility status PT Visit Diagnosis: Muscle weakness (generalized) (M62.81);Difficulty in walking, not elsewhere classified (R26.2)     Time: 9509-3267 PT Time Calculation (min) (ACUTE ONLY): 25 min  Charges:  $Gait Training: 23-37 mins                     Greggory Stallion, PT, DPT, GCS [phone]    Tracy Silva 12/19/2021, 12:07 PM

## 2021-12-19 NOTE — Progress Notes (Signed)
Occupational Therapy Treatment Patient Details Name: Tracy Silva MRN: [id number] DOB: [birth year] Today's Date: 12/19/2021   History of present illness 81yoF with a PMH of HTN, DM2, chronic LBBB, hypothyroidism, hx rectal cancer s/p colectomy (2018) who presented [facility] 12/16/2021 in the early morning hours in respiratory distress requiring BiPAP in the emergency department.  Cardiology is consulted for further assistance characterization of the etiology of her shortness of breath, query new CHF versus hypertensive urgency   OT comments  Pt seen for OT tx. Pt pleasant and agreeable. Pt completed bed mobility with +effort, MAX A To don socks (pt reports this is her baseline) and endorsing mild dizziness with initial standing with RW that resolves quickly. Pt ambulated in room and into hallway, 20', 20', 40', 75' with brief standing rest breaks + RW and PRN VC for correcting slight balance deficits with noted improvement. Pt educated in falls prevention strategies and the use of brief rest breaks to support activity pacing. HR 104 with exertion. In high 70's at rest. Pt progressing towards goals. Continue to recommend [place].    Recommendations for follow up therapy are one component of a multi-disciplinary discharge planning process, led by the attending physician.  Recommendations may be updated based on patient status, additional functional criteria and insurance authorization.    Follow Up Recommendations  Home health OT     Assistance Recommended at Discharge Intermittent Supervision/Assistance  Patient can return home with the following  A little help with walking and/or transfers;A little help with bathing/dressing/bathroom;Assistance with cooking/housework;Help with stairs or ramp for entrance;Assist for transportation   Equipment Recommendations  Other (comment) (RW)    Recommendations for Other Services      Precautions / Restrictions Precautions Precautions:  Fall Restrictions Weight Bearing Restrictions: No       Mobility Bed Mobility Overal bed mobility: Modified Independent             General bed mobility comments: +effort    Transfers Overall transfer level: Needs assistance Equipment used: Rolling walker (2 wheels) Transfers: Sit to/from Stand Sit to Stand: Min guard           General transfer comment: VC for hand placement, mild lightheadedness upon standing that resolves within ~42mn     Balance Overall balance assessment: Needs assistance Sitting-balance support: Feet supported Sitting balance-Leahy Scale: Good     Standing balance support: Reliant on assistive device for balance, During functional activity, Bilateral upper extremity supported Standing balance-Leahy Scale: Good                             ADL either performed or assessed with clinical judgement   ADL Overall ADL's : Needs assistance/impaired                     Lower Body Dressing: Maximal assistance;Sitting/lateral leans Lower Body Dressing Details (indicate cue type and reason): MAX A to don socks (her baseline)                    Extremity/Trunk Assessment              Vision       Perception     Praxis      Cognition Arousal/Alertness: Awake/alert Behavior During Therapy: WFL for tasks assessed/performed Overall Cognitive Status: Within Functional Limits for tasks assessed  Exercises Other Exercises Other Exercises: Pt ambulated in room and into hallway, 20', 20', 40', 40' with brief standing rest breaks + RW and PRN VC for correcting slight balance deficits with noted improvement    Shoulder Instructions       General Comments      Pertinent Vitals/ Pain       Pain Assessment Pain Assessment: No/denies pain  Home Living                                          Prior Functioning/Environment               Frequency  Min 2X/week        Progress Toward Goals  OT Goals(current goals can now be found in the care plan section)  Progress towards OT goals: Progressing toward goals  Acute Rehab OT Goals Patient Stated Goal: "I don't want to go back home" OT Goal Formulation: With patient Time For Goal Achievement: 01/01/22 Potential to Achieve Goals: Good  Plan Discharge plan remains appropriate;Frequency remains appropriate    Co-evaluation                 AM-PAC OT "6 Clicks" Daily Activity     Outcome Measure   Help from another person eating meals?: None Help from another person taking care of personal grooming?: A Little Help from another person toileting, which includes using toliet, bedpan, or urinal?: A Little Help from another person bathing (including washing, rinsing, drying)?: A Little Help from another person to put on and taking off regular upper body clothing?: None Help from another person to put on and taking off regular lower body clothing?: A Little 6 Click Score: 20    End of Session Equipment Utilized During Treatment: Gait belt;Rolling walker (2 wheels)  OT Visit Diagnosis: Unsteadiness on feet (R26.81);Repeated falls (R29.6);Muscle weakness (generalized) (M62.81)   Activity Tolerance Patient tolerated treatment well   Patient Left in bed;with call bell/phone within reach;with bed alarm set   Nurse Communication          Time: 5038-8828 OT Time Calculation (min): 20 min  Charges: OT General Charges $OT Visit: 1 Visit OT Treatments $Therapeutic Activity: 8-22 mins  Ardeth Perfect., MPH, MS, OTR/L ascom [phone] 12/19/21, 3:42 PM

## 2021-12-19 NOTE — Care Management Important Message (Signed)
Important Message  Patient Details  Name: Tracy Silva MRN: [id number] Date of Birth: January 12, 1941   Medicare Important Message Given:  Yes     Dannette Barbara 12/19/2021, 12:47 PM

## 2021-12-19 NOTE — TOC Progression Note (Addendum)
Transition of Care [facility]) - Progression Note    Patient Details  Name: Tracy Silva MRN: [id number] Date of Birth: 04/29/1940  Transition of Care [facility]) CM/SW [place], LCSW Phone Number: 12/19/2021, 10:06 AM  Clinical Narrative:    Call to daughter Tracy Silva per MD request.  Tracy Silva confirmed patient lives with her husband who she is caregiver for.  PCP is Dr. Alba Cory. Pharmacy is [facility]. Patient does not use DME at home and has no HH history.  Patient drives herself at baseline, but Tracy Silva is able to provide transport.  Provided resource list via email to [place]. Tracy Silva stated she is not sure if they want [place] services, she agreed to talk with patient about this and let CSW know prior to discharge if they would like [place] to be arranged. Tracy Silva verbalized understanding that if patient needs HH arranged after DC, they would need to reach out to her PCP Office.  Tracy Silva is agreeable to RW being ordered to be delivered to bedside prior to DC. Asked MD for RW orders and made referral to [place].   1:09- Call from daughter Tracy Silva who states patient does not want [place] arranged prior to DC.    Expected Discharge Plan: [place] Barriers to Discharge: Continued Medical Work up  Expected Discharge Plan and Services Expected Discharge Plan: [place]   Discharge Planning Services: CM Consult Post Acute Care Choice: [place] arrangements for the past 2 months: Single Family Home                                       Social Determinants of Health (SDOH) Interventions    Readmission Risk Interventions     No data to display

## 2021-12-19 NOTE — Progress Notes (Signed)
NSG 0700-1900:  At approx 1450, this RN verified with covering hospitalist and cardiology that heparin gtt should be Dc'd at 1500 as ordered (no daily note at the time to verify).  Per cards, OK to DC heparin and will plan for possible cardiac cath when kidney function improves.  Pt updated with POC.    Pt OOB to chair, chair to BSC/BR throughout shift without incident.  Pt with adequate PO intake, blood sugar monitoring and coverage with SSI as ordered.    Pt with no complaints, no SOB.  Currently in bed resting comfortably.  WCTM

## 2021-12-20 DIAGNOSIS — N1832 Chronic kidney disease, stage 3b: Secondary | ICD-10-CM | POA: Diagnosis not present

## 2021-12-20 DIAGNOSIS — I214 Non-ST elevation (NSTEMI) myocardial infarction: Secondary | ICD-10-CM | POA: Diagnosis not present

## 2021-12-20 DIAGNOSIS — I5023 Acute on chronic systolic (congestive) heart failure: Secondary | ICD-10-CM | POA: Diagnosis not present

## 2021-12-20 LAB — CBC
HCT: 27.9 % — ABNORMAL LOW (ref 36.0–46.0)
Hemoglobin: 9.1 g/dL — ABNORMAL LOW (ref 12.0–15.0)
MCH: 32.7 pg (ref 26.0–34.0)
MCHC: 32.6 g/dL (ref 30.0–36.0)
MCV: 100.4 fL — ABNORMAL HIGH (ref 80.0–100.0)
Platelets: 197 10*3/uL (ref 150–400)
RBC: 2.78 MIL/uL — ABNORMAL LOW (ref 3.87–5.11)
RDW: 14 % (ref 11.5–15.5)
WBC: 4 10*3/uL (ref 4.0–10.5)
nRBC: 0 % (ref 0.0–0.2)

## 2021-12-20 LAB — BASIC METABOLIC PANEL
Anion gap: 6 (ref 5–15)
BUN: 46 mg/dL — ABNORMAL HIGH (ref 8–23)
CO2: 25 mmol/L (ref 22–32)
Calcium: 9.1 mg/dL (ref 8.9–10.3)
Chloride: 104 mmol/L (ref 98–111)
Creatinine, Ser: 1.89 mg/dL — ABNORMAL HIGH (ref 0.44–1.00)
GFR, Estimated: 26 mL/min — ABNORMAL LOW (ref >60)
Glucose, Bld: 96 mg/dL (ref 70–99)
Potassium: 3.6 mmol/L (ref 3.5–5.1)
Sodium: 135 mmol/L (ref 135–145)

## 2021-12-20 LAB — GLUCOSE, CAPILLARY
Glucose-Capillary: 100 mg/dL — ABNORMAL HIGH (ref 70–99)
Glucose-Capillary: 102 mg/dL — ABNORMAL HIGH (ref 70–99)
Glucose-Capillary: 109 mg/dL — ABNORMAL HIGH (ref 70–99)
Glucose-Capillary: 138 mg/dL — ABNORMAL HIGH (ref 70–99)
Glucose-Capillary: 83 mg/dL (ref 70–99)
Glucose-Capillary: 96 mg/dL (ref 70–99)

## 2021-12-20 MED ORDER — PANTOPRAZOLE SODIUM 40 MG PO TBEC
40.0000 mg | DELAYED_RELEASE_TABLET | Freq: Every day | ORAL | Status: DC
  Administered 2021-12-21 – 2021-12-23 (×3): 40 mg via ORAL
  Filled 2021-12-20 (×3): qty 1

## 2021-12-20 NOTE — Progress Notes (Signed)
PROGRESS NOTE   HPI was taken from NP Rust-Chester: 81 yo F presenting to [facility] ED from home via EMS with complaints of CP and dyspnea over the last 2-3 days. She reports intermittent CP with some dyspnea that she thought might be asthma related, this CP was experienced both at rest and with activity. She describes chest pain as 8/10 mid-sternal anterior pressure that is not reproducible. She denies abdominal pain/ nausea (*however was given Zofran x 2 in ED)/ vomiting/ diarrhea, she denies urinary symptoms, fever/chills (but is currently shivering requiring 7 blankets). She woke up suddenly with dyspnea in the middle of the night prompting her to call EMS. Upon EMS arrival SpO2 in the 80's on RA, placed on CPAP for transport.   ED course: Upon arrival patient is tachycardic, tachypneic, hypertensive & in respiratory distress. Patient placed on BIPAP support. Patient with diffuse rales on exam. Treated for hypertensive urgency, flash pulmonary edema, acute CHF exacerbation. Medications given: fentanyl, Lasix, hydralazine, ativan, nitro paste, zofran Initial Vitals: 97.5, 30, 120, 155/75 & 100% on BIPAP 70% Significant labs: (Labs/ Imaging personally reviewed) I, Domingo Pulse Rust-Chester, AGACNP-BC, personally viewed and interpreted this ECG. EKG Interpretation: Date: 12/16/21, EKG Time: 04:10, Rate: 146, Rhythm: ST, QRS Axis:  LAD, Intervals: LBBB, ST/T Wave abnormalities: none, Narrative Interpretation: ST with LBBB (baseline LBBB) Chemistry: Na+: 142, K+: 4.0, BUN/Cr.: 33/1.66, Serum CO2/ AG: 22/9 Hematology: WBC:  7.6, Hgb: 9.7,  Troponin: 19, BNP: 1055, PCT: <0.10, COVID-19 & Influenza A/B: negative   ABG: 7.25/ 50/ 35/ 21.9 CXR 12/16/21: Widespread bilateral pulmonary interstitial opacity with no definite pleural fluid. Consider Bilateral Pneumonia versus Pulmonary Edema. Viral/atypical infectious etiology possible. Combined disc- and pin-like metallic foreign body projects over the midline  in the aortic arch region, etiology and significance unclear.   PCCM consulted for admission due to acute hypoxic respiratory failure requiring BIPAP with high risk for intubation.  As per Dr. Jimmye Norman 11/22-11/25/23: Pt presented w/ shortness of breath and was found to have CHF exacerbation & NSTEMI. Pt was initially treated w/ IV lasix for CHF exacerbation but it has been held a couple of days for increasing Cr. Of note, pt was treated w/ medical management for NSTEMI but pt will go for cardiac cath on 12/22/21 as per cardio.    Tracy Silva  DXI:[id number] DOB: 07-Jul-1940 DOA: 12/16/2021 PCP: Eulis Foster, MD    Assessment & Plan:   Principal Problem:   Acute respiratory failure ([place]) Active Problems:   Acute heart failure with preserved ejection fraction (HFpEF) (HCC)  Assessment and Plan: Acute hypoxic respiratory failure: likely secondary to pulmonary edema, CHF. Resolved   Possibly pulmonary edema: holding lasix. Cr is trending down again today   Acute combined CHF: continue on metoprolol, hydralazine. Holding lasix. Monitor I/Os. Echo shows EF 76-73%, grade I diastolic dysfunction, LV demonstrates global hypokinesis, mod AS   NSTEMI: w/ elevated troponins. IV heparin was d/c on 11/24 as per cardio. Continue on metoprolol, hydralazine, plavix, aspirin & zetia. Will go for cardiac cath on 12/22/21 as per cardio   AKI on CKDIIIb: Cr is trending down again today    Likely ACD: likely secondary to CKD. No  need for a transfusion currently   DM2: HbA1c 6.2, well controlled. Continue on SSI w/ accuchecks   Hypothyroidism: continue on home dose of levothyroxine   HLD: continue on zetia   Generalized weakness: PT recs HH        DVT prophylaxis: SCDs Code Status: full  Family  Communication: discussed pt's care w/ pt's daughter at bedside and answered her questions Disposition Plan: likely d/c back home   Level of care: progressive  Status is:  Inpatient Remains inpatient appropriate because: severity of illness    Consultants:  Cardio   Procedures:   Antimicrobials:    Subjective: Pt c/o fatigue   Objective: Vitals:   12/19/21 2055 12/20/21 0020 12/20/21 0445 12/20/21 0750  BP: (!) 151/76 (!) 140/61 127/68 (!) 138/55  Pulse: 99 88 85 84  Resp: '18 19 20 14  '$ Temp:  98.2 F (36.8 C) 98.2 F (36.8 C) (!) 97.5 F (36.4 C)  TempSrc:  Oral Oral   SpO2: 99% 98% 98% 100%  Weight:      Height:        Intake/Output Summary (Last 24 hours) at 12/20/2021 0812 Last data filed at 12/19/2021 1901 Gross per 24 hour  Intake 360 ml  Output --  Net 360 ml   Filed Weights   12/16/21 0426 12/18/21 0500  Weight: 70.5 kg 65.9 kg    Examination:  General exam: Appears comfortable   Respiratory system: diminished breath sounds b/l  Cardiovascular system: S1/S2+. No rubs or clicks  Gastrointestinal system: Abd is soft, NT, ND & normal bowel sounds  Central nervous system: Alert and awake. Moves all extremities  Psychiatry: Judgement and insight appears at baseline. Appropriate mood and affect    Data Reviewed: I have personally reviewed following labs and imaging studies  CBC: Recent Labs  Lab 12/16/21 0419 12/17/21 0617 12/18/21 0448 12/19/21 0428 12/20/21 0440  WBC 7.6 5.4 5.2 4.8 4.0  HGB 9.7* 9.0* 9.7* 9.1* 9.1*  HCT 30.4* 27.8* 29.6* 27.7* 27.9*  MCV 103.4* 100.0 100.0 99.6 100.4*  PLT 199 184 200 198 195   Basic Metabolic Panel: Recent Labs  Lab 12/16/21 0620 12/16/21 1311 12/16/21 1553 12/17/21 0617 12/18/21 0448 12/19/21 0428 12/20/21 0440  NA  --   --  138 137 140 140 135  K  --  4.4 4.1 3.7 3.9 3.7 3.6  CL  --   --  105 103 105 105 104  CO2  --   --  '24 26 24 25 25  '$ GLUCOSE  --   --  90 97 115* 104* 96  BUN  --   --  34* 41* 50* 48* 46*  CREATININE  --   --  1.81* 2.15* 2.51* 1.93* 1.89*  CALCIUM  --   --  8.8* 9.0 9.3 9.4 9.1  MG 2.2 2.1 2.0 2.0 2.1  --   --   PHOS 3.5 2.8  --  4.5  4.0  --   --    GFR: Estimated Creatinine Clearance: 20.6 mL/min (A) (by C-G formula based on SCr of 1.89 mg/dL (H)). Liver Function Tests: Recent Labs  Lab 12/16/21 0419  AST 25  ALT 14  ALKPHOS 67  BILITOT 0.7  PROT 7.1  ALBUMIN 3.9   No results for input(s): "LIPASE", "AMYLASE" in the last 168 hours. No results for input(s): "AMMONIA" in the last 168 hours. Coagulation Profile: Recent Labs  Lab 12/16/21 1336  INR 1.1   Cardiac Enzymes: No results for input(s): "CKTOTAL", "CKMB", "CKMBINDEX", "TROPONINI" in the last 168 hours. BNP (last 3 results) No results for input(s): "PROBNP" in the last 8760 hours. HbA1C: No results for input(s): "HGBA1C" in the last 72 hours. CBG: Recent Labs  Lab 12/19/21 1609 12/19/21 2043 12/20/21 0047 12/20/21 0446 12/20/21 0751  GLUCAP 92 120* 83 96  102*   Lipid Profile: No results for input(s): "CHOL", "HDL", "LDLCALC", "TRIG", "CHOLHDL", "LDLDIRECT" in the last 72 hours. Thyroid Function Tests: No results for input(s): "TSH", "T4TOTAL", "FREET4", "T3FREE", "THYROIDAB" in the last 72 hours.  Anemia Panel: No results for input(s): "VITAMINB12", "FOLATE", "FERRITIN", "TIBC", "IRON", "RETICCTPCT" in the last 72 hours.  Sepsis Labs: Recent Labs  Lab 12/16/21 0419 12/17/21 0617 12/18/21 0448  PROCALCITON <0.10 0.84 0.69    Recent Results (from the past 240 hour(s))  Resp Panel by RT-PCR (Flu A&B, Covid) Anterior Nasal Swab     Status: None   Collection Time: 12/16/21  4:19 AM   Specimen: Anterior Nasal Swab  Result Value Ref Range Status   SARS Coronavirus 2 by RT PCR NEGATIVE NEGATIVE Final    Comment: (NOTE) SARS-CoV-2 target nucleic acids are NOT DETECTED.  The SARS-CoV-2 RNA is generally detectable in upper respiratory specimens during the acute phase of infection. The lowest concentration of SARS-CoV-2 viral copies this assay can detect is 138 copies/mL. A negative result does not preclude SARS-Cov-2 infection and  should not be used as the sole basis for treatment or other patient management decisions. A negative result may occur with  improper specimen collection/handling, submission of specimen other than nasopharyngeal swab, presence of viral mutation(s) within the areas targeted by this assay, and inadequate number of viral copies(<138 copies/mL). A negative result must be combined with clinical observations, patient history, and epidemiological information. The expected result is Negative.  Fact Sheet for Patients:  EntrepreneurPulse.com.au  Fact Sheet for Healthcare Providers:  IncredibleEmployment.be  This test is no t yet approved or cleared by the [place] FDA and  has been authorized for detection and/or diagnosis of SARS-CoV-2 by FDA under an Emergency Use Authorization (EUA). This EUA will remain  in effect (meaning this test can be used) for the duration of the COVID-19 declaration under Section 564(b)(1) of the Act, 21 U.S.C.section 360bbb-3(b)(1), unless the authorization is terminated  or revoked sooner.       Influenza A by PCR NEGATIVE NEGATIVE Final   Influenza B by PCR NEGATIVE NEGATIVE Final    Comment: (NOTE) The Xpert Xpress SARS-CoV-2/FLU/RSV plus assay is intended as an aid in the diagnosis of influenza from Nasopharyngeal swab specimens and should not be used as a sole basis for treatment. Nasal washings and aspirates are unacceptable for Xpert Xpress SARS-CoV-2/FLU/RSV testing.  Fact Sheet for Patients: EntrepreneurPulse.com.au  Fact Sheet for Healthcare Providers: IncredibleEmployment.be  This test is not yet approved or cleared by the [place] FDA and has been authorized for detection and/or diagnosis of SARS-CoV-2 by FDA under an Emergency Use Authorization (EUA). This EUA will remain in effect (meaning this test can be used) for the duration of the COVID-19 declaration  under Section 564(b)(1) of the Act, 21 U.S.C. section 360bbb-3(b)(1), unless the authorization is terminated or revoked.  Performed at [facility], [address] [place], [place] [address]   MRSA Next Gen by PCR, Nasal     Status: None   Collection Time: 12/16/21  6:56 AM   Specimen: Nasal Mucosa; Nasal Swab  Result Value Ref Range Status   MRSA by PCR Next Gen NOT DETECTED NOT DETECTED Final    Comment: (NOTE) The GeneXpert MRSA Assay (FDA approved for NASAL specimens only), is one component of a comprehensive MRSA colonization surveillance program. It is not intended to diagnose MRSA infection nor to guide or monitor treatment for MRSA infections. Test performance is not FDA approved in  patients less than 45 years old. Performed at [facility], [address], [place], [place] [address]          Radiology Studies: No results found.      Scheduled Meds:  aspirin  81 mg Oral Daily   clopidogrel  75 mg Oral Daily   ezetimibe  10 mg Oral q morning   hydrALAZINE  10 mg Oral BID   insulin aspart  0-15 Units Subcutaneous Q4H   levothyroxine  75 mcg Oral Q0600   metoprolol tartrate  50 mg Oral BID   pantoprazole (PROTONIX) IV  40 mg Intravenous Q24H   Continuous Infusions:     LOS: 4 days    Time spent: 25 mins     Wyvonnia Dusky, MD [facility] Pager [phone]  If 7PM-7AM, please contact night-coverage [email or web address] 12/20/2021, 8:12 AM

## 2021-12-20 NOTE — Progress Notes (Signed)
[facility] Cardiology [facility] Encounter Note  Patient: Tracy Silva / Admit Date: 12/16/2021 / Date of Encounter: 12/20/2021, 5:44 AM   Subjective: 11/23.  Patient has been feeling much better overnight with no evidence of severe shortness of breath this morning.  The patient has had significant amount of diuresis since admission including greater than 3 L.  Patient has had a peak troponin of 1877 consistent with non-ST elevation myocardial infarction multifactorial in nature.  Patient does have significant amount of chronic kidney disease exacerbated at this time with a glomerular filtration rate of 22  11/24.  Patient is ambulating in the room without evidence of significant issues of shortness of breath chest pain or other concerns.  There has been no evidence of lower extremity edema.  Pulmonary edema resolved.  Patient has been on appropriate medication management and with further adjustments may have continued his improvements with congestive heart failure LV systolic dysfunction and moderate to severe aortic valve stenosis.  There will be no further intervention at this time for non-ST elevation myocardial infarction which appears to be demand ischemia  11/25.  Patient has been ambulating very well in the last 2 days and functional status is appropriate for discharge.  Glomerular filtration rate improved to 26 and may consider the possibility of discharged home on oral Lasix.  Patient may be concerned about discharge at this time and functional status and may be able to consider cardiac catheterization on Monday for further evaluation and treatment options  Echocardiogram showing moderate LV systolic dysfunction with ejection fraction of 30% and moderate aortic valve stenosis likely exacerbating above symptoms and congestive heart failure  Review of Systems: Positive for: Shortness of breath Negative for: Vision change, hearing change, syncope, dizziness, nausea, vomiting,diarrhea,  bloody stool, stomach pain, cough, congestion, diaphoresis, urinary frequency, urinary pain,skin lesions, skin rashes Others previously listed  Objective: Telemetry: Normal sinus rhythm Physical Exam: Blood pressure 127/68, pulse 85, temperature 98.2 F (36.8 C), temperature source Oral, resp. rate 20, height '5\' 3"'$  (1.6 m), weight 65.9 kg, SpO2 98 %. Body mass index is 25.72 kg/m. General: Well developed, well nourished, in no acute distress. Head: Normocephalic, atraumatic, sclera non-icteric, no xanthomas, nares are without discharge. Neck: No apparent masses Lungs: Normal respirations with no wheezes, no rhonchi, no rales , no crackles   Heart: Regular rate and rhythm, normal S1 S2, 3+ aortic murmur, no rub, no gallop, PMI is normal size and placement, carotid upstroke normal without bruit, jugular venous pressure normal Abdomen: Soft, non-tender, non-distended with normoactive bowel sounds. No hepatosplenomegaly. Abdominal aorta is normal size without bruit Extremities: Trace edema, no clubbing, no cyanosis, no ulcers,  Peripheral: 2+ radial, 2+ femoral, 2+ dorsal pedal pulses Neuro: Alert and oriented. Moves all extremities spontaneously. Psych:  Responds to questions appropriately with a normal affect.   Intake/Output Summary (Last 24 hours) at 12/20/2021 0544 Last data filed at 12/19/2021 1901 Gross per 24 hour  Intake 360 ml  Output --  Net 360 ml     Inpatient Medications:   aspirin  81 mg Oral Daily   clopidogrel  75 mg Oral Daily   ezetimibe  10 mg Oral q morning   hydrALAZINE  10 mg Oral BID   insulin aspart  0-15 Units Subcutaneous Q4H   levothyroxine  75 mcg Oral Q0600   metoprolol tartrate  50 mg Oral BID   pantoprazole (PROTONIX) IV  40 mg Intravenous Q24H   Infusions:     Labs: Recent Labs  12/17/21 0617 12/18/21 0448 12/19/21 0428 12/20/21 0440  NA 137 140 140 135  K 3.7 3.9 3.7 3.6  CL 103 105 105 104  CO2 '26 24 25 25  '$ GLUCOSE 97 115* 104* 96   BUN 41* 50* 48* 46*  CREATININE 2.15* 2.51* 1.93* 1.89*  CALCIUM 9.0 9.3 9.4 9.1  MG 2.0 2.1  --   --   PHOS 4.5 4.0  --   --     No results for input(s): "AST", "ALT", "ALKPHOS", "BILITOT", "PROT", "ALBUMIN" in the last 72 hours.  Recent Labs    12/19/21 0428 12/20/21 0440  WBC 4.8 4.0  HGB 9.1* 9.1*  HCT 27.7* 27.9*  MCV 99.6 100.4*  PLT 198 197    No results for input(s): "CKTOTAL", "CKMB", "TROPONINI" in the last 72 hours. Invalid input(s): "POCBNP" No results for input(s): "HGBA1C" in the last 72 hours.   Weights: Filed Weights   12/16/21 0426 12/18/21 0500  Weight: 70.5 kg 65.9 kg     Radiology/Studies:  ECHOCARDIOGRAM COMPLETE  Result Date: 12/16/2021    ECHOCARDIOGRAM REPORT   Patient Name:   Tracy Silva Date of Exam: 12/16/2021 Medical Rec #:  [id number]       Height:       63.0 in Accession #:    [id number]      Weight:       155.4 lb Date of Birth:  1941/01/23       BSA:          1.737 m Patient Age:    81 years        BP:           135/62 mmHg Patient Gender: F               HR:           88 bpm. Exam Location:  [facility] Procedure: 2D Echo, Color Doppler and Cardiac Doppler Indications:     I42.9 Cardiomyopathy-unspecified  History:         Patient has no prior history of Echocardiogram examinations.                  Risk Factors:Hypertension, Diabetes and HCL.  Sonographer:     Charmayne Sheer Referring Phys:  [id number] [place] TANG Diagnosing Phys: Serafina Royals MD IMPRESSIONS  1. Left ventricular ejection fraction, by estimation, is 25 to 30%. The left ventricle has severely decreased function. The left ventricle demonstrates global hypokinesis. The left ventricular internal cavity size was moderately dilated. There is moderate left ventricular hypertrophy. Left ventricular diastolic parameters are consistent with Grade I diastolic dysfunction (impaired relaxation).  2. Right ventricular systolic function is normal. The right ventricular size is normal.  3. Left  atrial size was mildly dilated.  4. The mitral valve is normal in structure. Mild to moderate mitral valve regurgitation.  5. Tricuspid valve regurgitation is mild to moderate.  6. The aortic valve is calcified. Aortic valve regurgitation is mild. Moderate aortic valve stenosis. FINDINGS  Left Ventricle: Left ventricular ejection fraction, by estimation, is 25 to 30%. The left ventricle has severely decreased function. The left ventricle demonstrates global hypokinesis. The left ventricular internal cavity size was moderately dilated. There is moderate left ventricular hypertrophy. Left ventricular diastolic parameters are consistent with Grade I diastolic dysfunction (impaired relaxation). Right Ventricle: The right ventricular size is normal. No increase in right ventricular wall thickness. Right ventricular systolic function is normal. Left Atrium: Left atrial size was mildly dilated.  Right Atrium: Right atrial size was normal in size. Pericardium: There is no evidence of pericardial effusion. Mitral Valve: The mitral valve is normal in structure. Mild to moderate mitral valve regurgitation. Tricuspid Valve: The tricuspid valve is normal in structure. Tricuspid valve regurgitation is mild to moderate. Aortic Valve: The aortic valve is calcified. Aortic valve regurgitation is mild. Aortic regurgitation PHT measures 244 msec. Moderate aortic stenosis is present. Aortic valve mean gradient measures 29.0 mmHg. Aortic valve peak gradient measures 46.4 mmHg. Aortic valve area, by VTI measures 0.69 cm. Pulmonic Valve: The pulmonic valve was normal in structure. Pulmonic valve regurgitation is trivial. Aorta: The aortic root and ascending aorta are structurally normal, with no evidence of dilitation. IAS/Shunts: No atrial level shunt detected by color flow Doppler.  LEFT VENTRICLE PLAX 2D LVIDd:         3.70 cm      Diastology LVIDs:         3.50 cm      LV e' medial:    4.13 cm/s LV PW:         0.90 cm      LV E/e'  medial:  42.5 LV IVS:        0.90 cm      LV e' lateral:   16.90 cm/s LVOT diam:     1.80 cm      LV E/e' lateral: 10.4 LV SV:         55 LV SV Index:   31 LVOT Area:     2.54 cm  LV Volumes (MOD) LV vol d, MOD A2C: 96.0 ml LV vol d, MOD A4C: 108.0 ml LV vol s, MOD A2C: 59.3 ml LV vol s, MOD A4C: 79.8 ml LV SV MOD A2C:     36.7 ml LV SV MOD A4C:     108.0 ml LV SV MOD BP:      34.4 ml RIGHT VENTRICLE RV Basal diam:  3.30 cm RV S prime:     10.70 cm/s TAPSE (M-mode): 2.1 cm LEFT ATRIUM             Index        RIGHT ATRIUM           Index LA diam:        3.50 cm 2.01 cm/m   RA Area:     11.30 cm LA Vol (A2C):   41.4 ml 23.83 ml/m  RA Volume:   25.60 ml  14.74 ml/m LA Vol (A4C):   38.8 ml 22.34 ml/m LA Biplane Vol: 40.0 ml 23.03 ml/m  AORTIC VALVE                     PULMONIC VALVE AV Area (Vmax):    0.85 cm      PV Vmax:       2.68 m/s AV Area (Vmean):   0.79 cm      PV Vmean:      207.000 cm/s AV Area (VTI):     0.69 cm      PV VTI:        0.631 m AV Vmax:           340.50 cm/s   PV Peak grad:  28.7 mmHg AV Vmean:          258.000 cm/s  PV Mean grad:  18.0 mmHg AV VTI:            0.791 m AV Peak Grad:  46.4 mmHg AV Mean Grad:      29.0 mmHg LVOT Vmax:         114.00 cm/s LVOT Vmean:        80.000 cm/s LVOT VTI:          0.215 m LVOT/AV VTI ratio: 0.27 AI PHT:            244 msec  AORTA Ao Root diam: 3.10 cm MITRAL VALVE MV Area (PHT): 5.44 cm     SHUNTS MV Decel Time: 139 msec     Systemic VTI:  0.22 m MV E velocity: 175.33 cm/s  Systemic Diam: 1.80 cm Serafina Royals MD Electronically signed by Serafina Royals MD Signature Date/Time: 12/16/2021/4:52:01 PM    Final    DG Chest Portable 1 View  Result Date: 12/16/2021 CLINICAL DATA:  81 year old female with chest pain for 3 days and shortness of breath. Retractions. EXAM: PORTABLE CHEST 1 VIEW COMPARISON:  CT Abdomen and Pelvis 07/22/2016. FINDINGS: Portable AP upright view at 0424 hours. Calcified aortic atherosclerosis. Cardiac size appears to  remain normal. Indistinct appearance of the bilateral hila. Relatively large lung volumes with widespread bilateral pulmonary interstitial opacity, coarse in the perihilar regions. No superimposed pneumothorax or consolidation. No pleural effusion identified. Combined disc like and pin-like metallic foreign body projects over the midline in the aortic arch region partially obscuring the trachea. Etiology and significance is unclear. No acute osseous abnormality identified. Negative visible bowel gas. IMPRESSION: 1. Widespread bilateral pulmonary interstitial opacity with no definite pleural fluid. Consider Bilateral Pneumonia versus Pulmonary Edema. Viral/atypical infectious etiology possible. 2. Combined disc- and pin-like metallic foreign body projects over the midline in the aortic arch region, etiology and significance unclear. 3. Aortic Atherosclerosis (ICD10-I70.0). Electronically Signed   By: Genevie Ann M.D.   On: 12/16/2021 04:52     Assessment and Recommendation  81 y.o. female with acute systolic dysfunction congestive heart failure multifactorial in nature including LV systolic dysfunction and moderate aortic valve stenosis and combination of acute kidney injury now improved with diuresis as well as having a non-ST elevation myocardial infarction 1.  Abstain from  diuresis at this point due to significant acute kidney injury and watch for improvements while watching closely for any further pulmonary edema well patient has recovery from acute on chronic kidney disease which is working at this time.  The patient does have slight amount of left basilar crackles 2.  Plavix and aspirin for non-ST elevation myocardial infarction 3.  Further evaluation of LV systolic dysfunction aortic valve stenosis if necessary after improvements acute kidney injury either as outpatient or inpatient depending on patient's comfort level and ambulation status throughout today 4.  Continue  beta-blocker for LV systolic  dysfunction but hold any ACE inhibitor at this time 5.  Continuation of ambulation following for any further significant symptoms.  Patient will help make decisions on potential discharge today if feeling comfortable versus keeping until Monday for cardiac catheterization to evaluate extent of aortic valve stenosis coronary artery disease and further risk stratification Signed, Serafina Royals M.D. FACC

## 2021-12-21 DIAGNOSIS — N179 Acute kidney failure, unspecified: Secondary | ICD-10-CM | POA: Diagnosis not present

## 2021-12-21 DIAGNOSIS — I5043 Acute on chronic combined systolic (congestive) and diastolic (congestive) heart failure: Secondary | ICD-10-CM | POA: Diagnosis not present

## 2021-12-21 DIAGNOSIS — R079 Chest pain, unspecified: Secondary | ICD-10-CM

## 2021-12-21 DIAGNOSIS — I5020 Unspecified systolic (congestive) heart failure: Secondary | ICD-10-CM

## 2021-12-21 DIAGNOSIS — I5023 Acute on chronic systolic (congestive) heart failure: Secondary | ICD-10-CM

## 2021-12-21 DIAGNOSIS — I214 Non-ST elevation (NSTEMI) myocardial infarction: Secondary | ICD-10-CM

## 2021-12-21 DIAGNOSIS — N189 Chronic kidney disease, unspecified: Secondary | ICD-10-CM

## 2021-12-21 DIAGNOSIS — E039 Hypothyroidism, unspecified: Secondary | ICD-10-CM

## 2021-12-21 DIAGNOSIS — D649 Anemia, unspecified: Secondary | ICD-10-CM

## 2021-12-21 HISTORY — DX: Non-ST elevation (NSTEMI) myocardial infarction: I21.4

## 2021-12-21 LAB — BASIC METABOLIC PANEL
Anion gap: 10 (ref 5–15)
BUN: 47 mg/dL — ABNORMAL HIGH (ref 8–23)
CO2: 24 mmol/L (ref 22–32)
Calcium: 9.3 mg/dL (ref 8.9–10.3)
Chloride: 107 mmol/L (ref 98–111)
Creatinine, Ser: 2.08 mg/dL — ABNORMAL HIGH (ref 0.44–1.00)
GFR, Estimated: 23 mL/min — ABNORMAL LOW (ref >60)
Glucose, Bld: 107 mg/dL — ABNORMAL HIGH (ref 70–99)
Potassium: 3.9 mmol/L (ref 3.5–5.1)
Sodium: 141 mmol/L (ref 135–145)

## 2021-12-21 LAB — CBC
HCT: 28.3 % — ABNORMAL LOW (ref 36.0–46.0)
Hemoglobin: 9.5 g/dL — ABNORMAL LOW (ref 12.0–15.0)
MCH: 33.9 pg (ref 26.0–34.0)
MCHC: 33.6 g/dL (ref 30.0–36.0)
MCV: 101.1 fL — ABNORMAL HIGH (ref 80.0–100.0)
Platelets: 197 10*3/uL (ref 150–400)
RBC: 2.8 MIL/uL — ABNORMAL LOW (ref 3.87–5.11)
RDW: 14.3 % (ref 11.5–15.5)
WBC: 4.9 10*3/uL (ref 4.0–10.5)
nRBC: 0 % (ref 0.0–0.2)

## 2021-12-21 LAB — GLUCOSE, CAPILLARY
Glucose-Capillary: 107 mg/dL — ABNORMAL HIGH (ref 70–99)
Glucose-Capillary: 113 mg/dL — ABNORMAL HIGH (ref 70–99)
Glucose-Capillary: 122 mg/dL — ABNORMAL HIGH (ref 70–99)
Glucose-Capillary: 91 mg/dL (ref 70–99)
Glucose-Capillary: 99 mg/dL (ref 70–99)

## 2021-12-21 MED ORDER — NITROGLYCERIN 0.4 MG SL SUBL
0.4000 mg | SUBLINGUAL_TABLET | SUBLINGUAL | Status: DC | PRN
  Administered 2021-12-21 (×2): 0.4 mg via SUBLINGUAL
  Filled 2021-12-21: qty 1

## 2021-12-21 MED ORDER — SODIUM CHLORIDE 0.9% FLUSH
3.0000 mL | Freq: Two times a day (BID) | INTRAVENOUS | Status: DC
  Administered 2021-12-21 – 2021-12-22 (×2): 3 mL via INTRAVENOUS

## 2021-12-21 MED ORDER — SODIUM CHLORIDE 0.9 % IV BOLUS
250.0000 mL | Freq: Once | INTRAVENOUS | Status: AC
  Administered 2021-12-21: 250 mL via INTRAVENOUS

## 2021-12-21 NOTE — H&P (View-Only) (Signed)
[facility] Encounter Note  Patient: Tracy Silva / Admit Date: 12/16/2021 / Date of Encounter: 12/21/2021, 5:13 PM   Subjective: 11/23.  Patient has been feeling much better overnight with no evidence of severe shortness of breath this morning.  The patient has had significant amount of diuresis since admission including greater than 3 L.  Patient has had a peak troponin of 1877 consistent with non-ST elevation myocardial infarction multifactorial in nature.  Patient does have significant amount of chronic kidney disease exacerbated at this time with a glomerular filtration rate of 22  11/24.  Patient is ambulating in the room without evidence of significant issues of shortness of breath chest pain or other concerns.  There has been no evidence of lower extremity edema.  Pulmonary edema resolved.  Patient has been on appropriate medication management and with further adjustments may have continued his improvements with congestive heart failure LV systolic dysfunction and moderate to severe aortic valve stenosis.  There will be no further intervention at this time for non-ST elevation myocardial infarction which appears to be demand ischemia  11/25.  Patient has been ambulating very well in the last 2 days and functional status is appropriate for discharge.  Glomerular filtration rate improved to 26 and may consider the possibility of discharged home on oral Lasix.  Patient may be concerned about discharge at this time and functional status and may be able to consider cardiac catheterization on Monday for further evaluation and treatment options  11/26.  Patient had an episode of chest pressure and tightening in her chest that did improve with nitroglycerin.  There was some mild amount of shortness of breath as well.  This is potentially consistent with coronary atherosclerosis and/or continued angina after non-ST elevation myocardial infarction.  We have discussed at length  further diagnostic testing and treatment options including the possibility of cardiac catheterization.  She understands the risk and benefits of cardiac catheterization.  This includes the possibility of death stroke heart attack infection bleeding or blood clot.  She is at low risk for conscious sedation.  Echocardiogram showing moderate LV systolic dysfunction with ejection fraction of 30% and moderate aortic valve stenosis likely exacerbating above symptoms and congestive heart failure  Review of Systems: Positive for: Shortness of breath Negative for: Vision change, hearing change, syncope, dizziness, nausea, vomiting,diarrhea, bloody stool, stomach pain, cough, congestion, diaphoresis, urinary frequency, urinary pain,skin lesions, skin rashes Others previously listed  Objective: Telemetry: Normal sinus rhythm Physical Exam: Blood pressure (!) 121/53, pulse 75, temperature 98.2 F (36.8 C), temperature source Oral, resp. rate 16, height '5\' 3"'$  (1.6 m), weight 63.3 kg, SpO2 100 %. Body mass index is 24.72 kg/m. General: Well developed, well nourished, in no acute distress. Head: Normocephalic, atraumatic, sclera non-icteric, no xanthomas, nares are without discharge. Neck: No apparent masses Lungs: Normal respirations with no wheezes, no rhonchi, no rales , no crackles   Heart: Regular rate and rhythm, normal S1 S2, 3+ aortic murmur, no rub, no gallop, PMI is normal size and placement, carotid upstroke normal without bruit, jugular venous pressure normal Abdomen: Soft, non-tender, non-distended with normoactive bowel sounds. No hepatosplenomegaly. Abdominal aorta is normal size without bruit Extremities: Trace edema, no clubbing, no cyanosis, no ulcers,  Peripheral: 2+ radial, 2+ femoral, 2+ dorsal pedal pulses Neuro: Alert and oriented. Moves all extremities spontaneously. Psych:  Responds to questions appropriately with a normal affect.  No intake or output data in the 24 hours ending  12/21/21 1713   Inpatient  Medications:   aspirin  81 mg Oral Daily   clopidogrel  75 mg Oral Daily   ezetimibe  10 mg Oral q morning   hydrALAZINE  10 mg Oral BID   levothyroxine  75 mcg Oral Q0600   metoprolol tartrate  50 mg Oral BID   pantoprazole  40 mg Oral Daily   sodium chloride flush  3 mL Intravenous Q12H   Infusions:     Labs: Recent Labs    12/20/21 0440 12/21/21 0501  NA 135 141  K 3.6 3.9  CL 104 107  CO2 25 24  GLUCOSE 96 107*  BUN 46* 47*  CREATININE 1.89* 2.08*  CALCIUM 9.1 9.3    No results for input(s): "AST", "ALT", "ALKPHOS", "BILITOT", "PROT", "ALBUMIN" in the last 72 hours.  Recent Labs    12/20/21 0440 12/21/21 0501  WBC 4.0 4.9  HGB 9.1* 9.5*  HCT 27.9* 28.3*  MCV 100.4* 101.1*  PLT 197 197    No results for input(s): "CKTOTAL", "CKMB", "TROPONINI" in the last 72 hours. Invalid input(s): "POCBNP" No results for input(s): "HGBA1C" in the last 72 hours.   Weights: Filed Weights   12/16/21 0426 12/18/21 0500 12/21/21 0500  Weight: 70.5 kg 65.9 kg 63.3 kg     Radiology/Studies:  ECHOCARDIOGRAM COMPLETE  Result Date: 12/16/2021    ECHOCARDIOGRAM REPORT   Patient Name:   Tracy Silva Date of Exam: 12/16/2021 Medical Rec #:  [id number]       Height:       63.0 in Accession #:    [id number]      Weight:       155.4 lb Date of Birth:  Jul 30, 1940       BSA:          1.737 m Patient Age:    81 years        BP:           135/62 mmHg Patient Gender: F               HR:           88 bpm. Exam Location:  [facility] Procedure: 2D Echo, Color Doppler and Cardiac Doppler Indications:     I42.9 Cardiomyopathy-unspecified  History:         Patient has no prior history of Echocardiogram examinations.                  Risk Factors:Hypertension, Diabetes and HCL.  Sonographer:     Charmayne Sheer Referring Phys:  [id number] [place] TANG Diagnosing Phys: Serafina Royals MD IMPRESSIONS  1. Left ventricular ejection fraction, by estimation, is 25 to 30%. The left  ventricle has severely decreased function. The left ventricle demonstrates global hypokinesis. The left ventricular internal cavity size was moderately dilated. There is moderate left ventricular hypertrophy. Left ventricular diastolic parameters are consistent with Grade I diastolic dysfunction (impaired relaxation).  2. Right ventricular systolic function is normal. The right ventricular size is normal.  3. Left atrial size was mildly dilated.  4. The mitral valve is normal in structure. Mild to moderate mitral valve regurgitation.  5. Tricuspid valve regurgitation is mild to moderate.  6. The aortic valve is calcified. Aortic valve regurgitation is mild. Moderate aortic valve stenosis. FINDINGS  Left Ventricle: Left ventricular ejection fraction, by estimation, is 25 to 30%. The left ventricle has severely decreased function. The left ventricle demonstrates global hypokinesis. The left ventricular internal cavity size was moderately dilated. There is moderate  left ventricular hypertrophy. Left ventricular diastolic parameters are consistent with Grade I diastolic dysfunction (impaired relaxation). Right Ventricle: The right ventricular size is normal. No increase in right ventricular wall thickness. Right ventricular systolic function is normal. Left Atrium: Left atrial size was mildly dilated. Right Atrium: Right atrial size was normal in size. Pericardium: There is no evidence of pericardial effusion. Mitral Valve: The mitral valve is normal in structure. Mild to moderate mitral valve regurgitation. Tricuspid Valve: The tricuspid valve is normal in structure. Tricuspid valve regurgitation is mild to moderate. Aortic Valve: The aortic valve is calcified. Aortic valve regurgitation is mild. Aortic regurgitation PHT measures 244 msec. Moderate aortic stenosis is present. Aortic valve mean gradient measures 29.0 mmHg. Aortic valve peak gradient measures 46.4 mmHg. Aortic valve area, by VTI measures 0.69 cm.  Pulmonic Valve: The pulmonic valve was normal in structure. Pulmonic valve regurgitation is trivial. Aorta: The aortic root and ascending aorta are structurally normal, with no evidence of dilitation. IAS/Shunts: No atrial level shunt detected by color flow Doppler.  LEFT VENTRICLE PLAX 2D LVIDd:         3.70 cm      Diastology LVIDs:         3.50 cm      LV e' medial:    4.13 cm/s LV PW:         0.90 cm      LV E/e' medial:  42.5 LV IVS:        0.90 cm      LV e' lateral:   16.90 cm/s LVOT diam:     1.80 cm      LV E/e' lateral: 10.4 LV SV:         55 LV SV Index:   31 LVOT Area:     2.54 cm  LV Volumes (MOD) LV vol d, MOD A2C: 96.0 ml LV vol d, MOD A4C: 108.0 ml LV vol s, MOD A2C: 59.3 ml LV vol s, MOD A4C: 79.8 ml LV SV MOD A2C:     36.7 ml LV SV MOD A4C:     108.0 ml LV SV MOD BP:      34.4 ml RIGHT VENTRICLE RV Basal diam:  3.30 cm RV S prime:     10.70 cm/s TAPSE (M-mode): 2.1 cm LEFT ATRIUM             Index        RIGHT ATRIUM           Index LA diam:        3.50 cm 2.01 cm/m   RA Area:     11.30 cm LA Vol (A2C):   41.4 ml 23.83 ml/m  RA Volume:   25.60 ml  14.74 ml/m LA Vol (A4C):   38.8 ml 22.34 ml/m LA Biplane Vol: 40.0 ml 23.03 ml/m  AORTIC VALVE                     PULMONIC VALVE AV Area (Vmax):    0.85 cm      PV Vmax:       2.68 m/s AV Area (Vmean):   0.79 cm      PV Vmean:      207.000 cm/s AV Area (VTI):     0.69 cm      PV VTI:        0.631 m AV Vmax:           340.50 cm/s   PV Peak  grad:  28.7 mmHg AV Vmean:          258.000 cm/s  PV Mean grad:  18.0 mmHg AV VTI:            0.791 m AV Peak Grad:      46.4 mmHg AV Mean Grad:      29.0 mmHg LVOT Vmax:         114.00 cm/s LVOT Vmean:        80.000 cm/s LVOT VTI:          0.215 m LVOT/AV VTI ratio: 0.27 AI PHT:            244 msec  AORTA Ao Root diam: 3.10 cm MITRAL VALVE MV Area (PHT): 5.44 cm     SHUNTS MV Decel Time: 139 msec     Systemic VTI:  0.22 m MV E velocity: 175.33 cm/s  Systemic Diam: 1.80 cm Serafina Royals MD Electronically  signed by Serafina Royals MD Signature Date/Time: 12/16/2021/4:52:01 PM    Final    DG Chest Portable 1 View  Result Date: 12/16/2021 CLINICAL DATA:  81 year old female with chest pain for 3 days and shortness of breath. Retractions. EXAM: PORTABLE CHEST 1 VIEW COMPARISON:  CT Abdomen and Pelvis 07/22/2016. FINDINGS: Portable AP upright view at 0424 hours. Calcified aortic atherosclerosis. Cardiac size appears to remain normal. Indistinct appearance of the bilateral hila. Relatively large lung volumes with widespread bilateral pulmonary interstitial opacity, coarse in the perihilar regions. No superimposed pneumothorax or consolidation. No pleural effusion identified. Combined disc like and pin-like metallic foreign body projects over the midline in the aortic arch region partially obscuring the trachea. Etiology and significance is unclear. No acute osseous abnormality identified. Negative visible bowel gas. IMPRESSION: 1. Widespread bilateral pulmonary interstitial opacity with no definite pleural fluid. Consider Bilateral Pneumonia versus Pulmonary Edema. Viral/atypical infectious etiology possible. 2. Combined disc- and pin-like metallic foreign body projects over the midline in the aortic arch region, etiology and significance unclear. 3. Aortic Atherosclerosis (ICD10-I70.0). Electronically Signed   By: Genevie Ann M.D.   On: 12/16/2021 04:52     Assessment and Recommendation  81 y.o. female with acute systolic dysfunction congestive heart failure multifactorial in nature including LV systolic dysfunction and moderate aortic valve stenosis and combination of acute kidney injury now improved with diuresis as well as having a non-ST elevation myocardial infarction 1.  Abstain from  diuresis at this point due to significant acute kidney injury and watch for improvements while watching closely for any further pulmonary edema well patient has recovery from acute on chronic kidney disease which is working at this  time.  The patient does have slight amount of left basilar crackles but slightly improved from yesterday 2.  Plavix and aspirin for non-ST elevation myocardial infarction 3.  Proceed to cardiac catheterization to assess coronary anatomy and further treatment thereof as necessary as well as that evaluation of the extent of aortic valve stenosis.   4.  Continue  beta-blocker for LV systolic dysfunction but hold any ACE inhibitor at this time 5.   Further treatment options after above Signed, Serafina Royals M.D. FACC

## 2021-12-21 NOTE — Progress Notes (Signed)
[facility] Encounter Note  Patient: Tracy Silva / Admit Date: 12/16/2021 / Date of Encounter: 12/21/2021, 5:13 PM   Subjective: 11/23.  Patient has been feeling much better overnight with no evidence of severe shortness of breath this morning.  The patient has had significant amount of diuresis since admission including greater than 3 L.  Patient has had a peak troponin of 1877 consistent with non-ST elevation myocardial infarction multifactorial in nature.  Patient does have significant amount of chronic kidney disease exacerbated at this time with a glomerular filtration rate of 22  11/24.  Patient is ambulating in the room without evidence of significant issues of shortness of breath chest pain or other concerns.  There has been no evidence of lower extremity edema.  Pulmonary edema resolved.  Patient has been on appropriate medication management and with further adjustments may have continued his improvements with congestive heart failure LV systolic dysfunction and moderate to severe aortic valve stenosis.  There will be no further intervention at this time for non-ST elevation myocardial infarction which appears to be demand ischemia  11/25.  Patient has been ambulating very well in the last 2 days and functional status is appropriate for discharge.  Glomerular filtration rate improved to 26 and may consider the possibility of discharged home on oral Lasix.  Patient may be concerned about discharge at this time and functional status and may be able to consider cardiac catheterization on Monday for further evaluation and treatment options  11/26.  Patient had an episode of chest pressure and tightening in her chest that did improve with nitroglycerin.  There was some mild amount of shortness of breath as well.  This is potentially consistent with coronary atherosclerosis and/or continued angina after non-ST elevation myocardial infarction.  We have discussed at length  further diagnostic testing and treatment options including the possibility of cardiac catheterization.  She understands the risk and benefits of cardiac catheterization.  This includes the possibility of death stroke heart attack infection bleeding or blood clot.  She is at low risk for conscious sedation.  Echocardiogram showing moderate LV systolic dysfunction with ejection fraction of 30% and moderate aortic valve stenosis likely exacerbating above symptoms and congestive heart failure  Review of Systems: Positive for: Shortness of breath Negative for: Vision change, hearing change, syncope, dizziness, nausea, vomiting,diarrhea, bloody stool, stomach pain, cough, congestion, diaphoresis, urinary frequency, urinary pain,skin lesions, skin rashes Others previously listed  Objective: Telemetry: Normal sinus rhythm Physical Exam: Blood pressure (!) 121/53, pulse 75, temperature 98.2 F (36.8 C), temperature source Oral, resp. rate 16, height '5\' 3"'$  (1.6 m), weight 63.3 kg, SpO2 100 %. Body mass index is 24.72 kg/m. General: Well developed, well nourished, in no acute distress. Head: Normocephalic, atraumatic, sclera non-icteric, no xanthomas, nares are without discharge. Neck: No apparent masses Lungs: Normal respirations with no wheezes, no rhonchi, no rales , no crackles   Heart: Regular rate and rhythm, normal S1 S2, 3+ aortic murmur, no rub, no gallop, PMI is normal size and placement, carotid upstroke normal without bruit, jugular venous pressure normal Abdomen: Soft, non-tender, non-distended with normoactive bowel sounds. No hepatosplenomegaly. Abdominal aorta is normal size without bruit Extremities: Trace edema, no clubbing, no cyanosis, no ulcers,  Peripheral: 2+ radial, 2+ femoral, 2+ dorsal pedal pulses Neuro: Alert and oriented. Moves all extremities spontaneously. Psych:  Responds to questions appropriately with a normal affect.  No intake or output data in the 24 hours ending  12/21/21 1713   Inpatient  Medications:   aspirin  81 mg Oral Daily   clopidogrel  75 mg Oral Daily   ezetimibe  10 mg Oral q morning   hydrALAZINE  10 mg Oral BID   levothyroxine  75 mcg Oral Q0600   metoprolol tartrate  50 mg Oral BID   pantoprazole  40 mg Oral Daily   sodium chloride flush  3 mL Intravenous Q12H   Infusions:     Labs: Recent Labs    12/20/21 0440 12/21/21 0501  NA 135 141  K 3.6 3.9  CL 104 107  CO2 25 24  GLUCOSE 96 107*  BUN 46* 47*  CREATININE 1.89* 2.08*  CALCIUM 9.1 9.3    No results for input(s): "AST", "ALT", "ALKPHOS", "BILITOT", "PROT", "ALBUMIN" in the last 72 hours.  Recent Labs    12/20/21 0440 12/21/21 0501  WBC 4.0 4.9  HGB 9.1* 9.5*  HCT 27.9* 28.3*  MCV 100.4* 101.1*  PLT 197 197    No results for input(s): "CKTOTAL", "CKMB", "TROPONINI" in the last 72 hours. Invalid input(s): "POCBNP" No results for input(s): "HGBA1C" in the last 72 hours.   Weights: Filed Weights   12/16/21 0426 12/18/21 0500 12/21/21 0500  Weight: 70.5 kg 65.9 kg 63.3 kg     Radiology/Studies:  ECHOCARDIOGRAM COMPLETE  Result Date: 12/16/2021    ECHOCARDIOGRAM REPORT   Patient Name:   Tracy Silva Date of Exam: 12/16/2021 Medical Rec #:  [id number]       Height:       63.0 in Accession #:    [id number]      Weight:       155.4 lb Date of Birth:  Feb 24, 1940       BSA:          1.737 m Patient Age:    81 years        BP:           135/62 mmHg Patient Gender: F               HR:           88 bpm. Exam Location:  [facility] Procedure: 2D Echo, Color Doppler and Cardiac Doppler Indications:     I42.9 Cardiomyopathy-unspecified  History:         Patient has no prior history of Echocardiogram examinations.                  Risk Factors:Hypertension, Diabetes and HCL.  Sonographer:     Charmayne Sheer Referring Phys:  [id number] [place] TANG Diagnosing Phys: Serafina Royals MD IMPRESSIONS  1. Left ventricular ejection fraction, by estimation, is 25 to 30%. The left  ventricle has severely decreased function. The left ventricle demonstrates global hypokinesis. The left ventricular internal cavity size was moderately dilated. There is moderate left ventricular hypertrophy. Left ventricular diastolic parameters are consistent with Grade I diastolic dysfunction (impaired relaxation).  2. Right ventricular systolic function is normal. The right ventricular size is normal.  3. Left atrial size was mildly dilated.  4. The mitral valve is normal in structure. Mild to moderate mitral valve regurgitation.  5. Tricuspid valve regurgitation is mild to moderate.  6. The aortic valve is calcified. Aortic valve regurgitation is mild. Moderate aortic valve stenosis. FINDINGS  Left Ventricle: Left ventricular ejection fraction, by estimation, is 25 to 30%. The left ventricle has severely decreased function. The left ventricle demonstrates global hypokinesis. The left ventricular internal cavity size was moderately dilated. There is moderate  left ventricular hypertrophy. Left ventricular diastolic parameters are consistent with Grade I diastolic dysfunction (impaired relaxation). Right Ventricle: The right ventricular size is normal. No increase in right ventricular wall thickness. Right ventricular systolic function is normal. Left Atrium: Left atrial size was mildly dilated. Right Atrium: Right atrial size was normal in size. Pericardium: There is no evidence of pericardial effusion. Mitral Valve: The mitral valve is normal in structure. Mild to moderate mitral valve regurgitation. Tricuspid Valve: The tricuspid valve is normal in structure. Tricuspid valve regurgitation is mild to moderate. Aortic Valve: The aortic valve is calcified. Aortic valve regurgitation is mild. Aortic regurgitation PHT measures 244 msec. Moderate aortic stenosis is present. Aortic valve mean gradient measures 29.0 mmHg. Aortic valve peak gradient measures 46.4 mmHg. Aortic valve area, by VTI measures 0.69 cm.  Pulmonic Valve: The pulmonic valve was normal in structure. Pulmonic valve regurgitation is trivial. Aorta: The aortic root and ascending aorta are structurally normal, with no evidence of dilitation. IAS/Shunts: No atrial level shunt detected by color flow Doppler.  LEFT VENTRICLE PLAX 2D LVIDd:         3.70 cm      Diastology LVIDs:         3.50 cm      LV e' medial:    4.13 cm/s LV PW:         0.90 cm      LV E/e' medial:  42.5 LV IVS:        0.90 cm      LV e' lateral:   16.90 cm/s LVOT diam:     1.80 cm      LV E/e' lateral: 10.4 LV SV:         55 LV SV Index:   31 LVOT Area:     2.54 cm  LV Volumes (MOD) LV vol d, MOD A2C: 96.0 ml LV vol d, MOD A4C: 108.0 ml LV vol s, MOD A2C: 59.3 ml LV vol s, MOD A4C: 79.8 ml LV SV MOD A2C:     36.7 ml LV SV MOD A4C:     108.0 ml LV SV MOD BP:      34.4 ml RIGHT VENTRICLE RV Basal diam:  3.30 cm RV S prime:     10.70 cm/s TAPSE (M-mode): 2.1 cm LEFT ATRIUM             Index        RIGHT ATRIUM           Index LA diam:        3.50 cm 2.01 cm/m   RA Area:     11.30 cm LA Vol (A2C):   41.4 ml 23.83 ml/m  RA Volume:   25.60 ml  14.74 ml/m LA Vol (A4C):   38.8 ml 22.34 ml/m LA Biplane Vol: 40.0 ml 23.03 ml/m  AORTIC VALVE                     PULMONIC VALVE AV Area (Vmax):    0.85 cm      PV Vmax:       2.68 m/s AV Area (Vmean):   0.79 cm      PV Vmean:      207.000 cm/s AV Area (VTI):     0.69 cm      PV VTI:        0.631 m AV Vmax:           340.50 cm/s   PV Peak  grad:  28.7 mmHg AV Vmean:          258.000 cm/s  PV Mean grad:  18.0 mmHg AV VTI:            0.791 m AV Peak Grad:      46.4 mmHg AV Mean Grad:      29.0 mmHg LVOT Vmax:         114.00 cm/s LVOT Vmean:        80.000 cm/s LVOT VTI:          0.215 m LVOT/AV VTI ratio: 0.27 AI PHT:            244 msec  AORTA Ao Root diam: 3.10 cm MITRAL VALVE MV Area (PHT): 5.44 cm     SHUNTS MV Decel Time: 139 msec     Systemic VTI:  0.22 m MV E velocity: 175.33 cm/s  Systemic Diam: 1.80 cm Serafina Royals MD Electronically  signed by Serafina Royals MD Signature Date/Time: 12/16/2021/4:52:01 PM    Final    DG Chest Portable 1 View  Result Date: 12/16/2021 CLINICAL DATA:  81 year old female with chest pain for 3 days and shortness of breath. Retractions. EXAM: PORTABLE CHEST 1 VIEW COMPARISON:  CT Abdomen and Pelvis 07/22/2016. FINDINGS: Portable AP upright view at 0424 hours. Calcified aortic atherosclerosis. Cardiac size appears to remain normal. Indistinct appearance of the bilateral hila. Relatively large lung volumes with widespread bilateral pulmonary interstitial opacity, coarse in the perihilar regions. No superimposed pneumothorax or consolidation. No pleural effusion identified. Combined disc like and pin-like metallic foreign body projects over the midline in the aortic arch region partially obscuring the trachea. Etiology and significance is unclear. No acute osseous abnormality identified. Negative visible bowel gas. IMPRESSION: 1. Widespread bilateral pulmonary interstitial opacity with no definite pleural fluid. Consider Bilateral Pneumonia versus Pulmonary Edema. Viral/atypical infectious etiology possible. 2. Combined disc- and pin-like metallic foreign body projects over the midline in the aortic arch region, etiology and significance unclear. 3. Aortic Atherosclerosis (ICD10-I70.0). Electronically Signed   By: Genevie Ann M.D.   On: 12/16/2021 04:52     Assessment and Recommendation  81 y.o. female with acute systolic dysfunction congestive heart failure multifactorial in nature including LV systolic dysfunction and moderate aortic valve stenosis and combination of acute kidney injury now improved with diuresis as well as having a non-ST elevation myocardial infarction 1.  Abstain from  diuresis at this point due to significant acute kidney injury and watch for improvements while watching closely for any further pulmonary edema well patient has recovery from acute on chronic kidney disease which is working at this  time.  The patient does have slight amount of left basilar crackles but slightly improved from yesterday 2.  Plavix and aspirin for non-ST elevation myocardial infarction 3.  Proceed to cardiac catheterization to assess coronary anatomy and further treatment thereof as necessary as well as that evaluation of the extent of aortic valve stenosis.   4.  Continue  beta-blocker for LV systolic dysfunction but hold any ACE inhibitor at this time 5.   Further treatment options after above Signed, Serafina Royals M.D. FACC

## 2021-12-21 NOTE — Progress Notes (Signed)
Mobility Specialist - Progress Note    12/21/21 1100  Mobility  Activity Ambulated with assistance to bathroom;Stood at bedside  Level of Assistance Standby assist, set-up cues, supervision of patient - no hands on  Assistive Device Front wheel walker  Distance Ambulated (ft) 10 ft  Activity Response Tolerated well  Mobility Referral Yes  $Mobility charge 1 Mobility   Pt aroused to knock and voice on RA upon entry. PT STS and ambulates to bathroom SBA. Pt returns to bed and left with needs in reach.   Loma Sender Mobility Specialist 12/21/21, 11:50 AM

## 2021-12-21 NOTE — Assessment & Plan Note (Signed)
Continue Synthroid °

## 2021-12-21 NOTE — Assessment & Plan Note (Addendum)
Continue aspirin, Plavix and metoprolol.  Allergy to statin on Zetia.  Cardiac cath showing severe aortic stenosis and coronary arteries due to Blockages but Not Enough for a Stent.Marland Kitchen

## 2021-12-21 NOTE — Hospital Course (Addendum)
81 yo F presenting to [facility] ED from home via EMS with complaints of CP and dyspnea over the last 2-3 days. She reports intermittent CP with some dyspnea that she thought might be asthma related, this CP was experienced both at rest and with activity. She describes chest pain as 8/10 mid-sternal anterior pressure that is not reproducible. She denies abdominal pain/ nausea (*however was given Zofran x 2 in ED)/ vomiting/ diarrhea, she denies urinary symptoms, fever/chills (but is currently shivering requiring 7 blankets). She woke up suddenly with dyspnea in the middle of the night prompting her to call EMS. Upon EMS arrival SpO2 in the 80's on RA, placed on CPAP for transport.   ED course: Upon arrival patient is tachycardic, tachypneic, hypertensive & in respiratory distress. Patient placed on BIPAP support. Patient with diffuse rales on exam. Treated for hypertensive urgency, flash pulmonary edema, acute CHF exacerbation. Medications given: fentanyl, Lasix, hydralazine, ativan, nitro paste, zofran Initial Vitals: 97.5, 30, 120, 155/75 & 100% on BIPAP 70% Significant labs: (Labs/ Imaging personally reviewed) I, Domingo Pulse [place], AGACNP-BC, personally viewed and interpreted this ECG. EKG Interpretation: Date: 12/16/21, EKG Time: 04:10, Rate: 146, Rhythm: ST, QRS Axis:  LAD, Intervals: LBBB, ST/T Wave abnormalities: none, Narrative Interpretation: ST with LBBB (baseline LBBB) Chemistry: Na+: 142, K+: 4.0, BUN/Cr.: 33/1.66, Serum CO2/ AG: 22/9 Hematology: WBC:  7.6, Hgb: 9.7,  Troponin: 19, BNP: 1055, PCT: <0.10, COVID-19 & Influenza A/B: negative   ABG: 7.25/ 50/ 35/ 21.9 CXR 12/16/21: Widespread bilateral pulmonary interstitial opacity with no definite pleural fluid. Consider Bilateral Pneumonia versus Pulmonary Edema. Viral/atypical infectious etiology possible. Combined disc- and pin-like metallic foreign body projects over the midline in the aortic arch region, etiology and  significance unclear.   PCCM consulted for admission due to acute hypoxic respiratory failure requiring BIPAP with high risk for intubation.   As per Dr. Jimmye Norman 11/22-11/25/23: Pt presented w/ shortness of breath and was found to have CHF exacerbation & NSTEMI. Pt was initially treated w/ IV lasix for CHF exacerbation but it has been held a couple of days for increasing Cr. Of note, pt was treated w/ medical management for NSTEMI but pt will go for cardiac cath on 12/22/21 as per cardio.  Cardiac catheterization on 12/22/2021 showed nonobstructive coronary artery disease with severe aortic stenosis.  Cardiology recommended medical management and referral to cardiothoracic surgery as outpatient for consideration of TAVR procedure.

## 2021-12-21 NOTE — Assessment & Plan Note (Deleted)
Last hemoglobin 9.5.  We will check a ferritin tomorrow morning.

## 2021-12-21 NOTE — Assessment & Plan Note (Addendum)
Acute kidney injury on CKD stage IIIb.  Today's creatinine 1.86 creatinine on presentation 1.66.  Check creatinine tomorrow.

## 2021-12-21 NOTE — Assessment & Plan Note (Addendum)
Holding diuresis with elevated creatinine.  EF 25 to 30% with moderate aortic stenosis.  Continue metoprolol.  Cardiac catheter with severe aortic stenosis.

## 2021-12-21 NOTE — Progress Notes (Signed)
Progress Note   Patient: Tracy Silva:[id number] DOB: Nov 08, 1940 DOA: 12/16/2021     5 DOS: the patient was seen and examined on 12/21/2021   Brief hospital course: 81 yo F presenting to [facility] ED from home via EMS with complaints of CP and dyspnea over the last 2-3 days. She reports intermittent CP with some dyspnea that she thought might be asthma related, this CP was experienced both at rest and with activity. She describes chest pain as 8/10 mid-sternal anterior pressure that is not reproducible. She denies abdominal pain/ nausea (*however was given Zofran x 2 in ED)/ vomiting/ diarrhea, she denies urinary symptoms, fever/chills (but is currently shivering requiring 7 blankets). She woke up suddenly with dyspnea in the middle of the night prompting her to call EMS. Upon EMS arrival SpO2 in the 80's on RA, placed on CPAP for transport.   ED course: Upon arrival patient is tachycardic, tachypneic, hypertensive & in respiratory distress. Patient placed on BIPAP support. Patient with diffuse rales on exam. Treated for hypertensive urgency, flash pulmonary edema, acute CHF exacerbation. Medications given: fentanyl, Lasix, hydralazine, ativan, nitro paste, zofran Initial Vitals: 97.5, 30, 120, 155/75 & 100% on BIPAP 70% Significant labs: (Labs/ Imaging personally reviewed) I, Domingo Pulse [place], AGACNP-BC, personally viewed and interpreted this ECG. EKG Interpretation: Date: 12/16/21, EKG Time: 04:10, Rate: 146, Rhythm: ST, QRS Axis:  LAD, Intervals: LBBB, ST/T Wave abnormalities: none, Narrative Interpretation: ST with LBBB (baseline LBBB) Chemistry: Na+: 142, K+: 4.0, BUN/Cr.: 33/1.66, Serum CO2/ AG: 22/9 Hematology: WBC:  7.6, Hgb: 9.7,  Troponin: 19, BNP: 1055, PCT: <0.10, COVID-19 & Influenza A/B: negative   ABG: 7.25/ 50/ 35/ 21.9 CXR 12/16/21: Widespread bilateral pulmonary interstitial opacity with no definite pleural fluid. Consider Bilateral Pneumonia versus Pulmonary Edema.  Viral/atypical infectious etiology possible. Combined disc- and pin-like metallic foreign body projects over the midline in the aortic arch region, etiology and significance unclear.   PCCM consulted for admission due to acute hypoxic respiratory failure requiring BIPAP with high risk for intubation.   As per Dr. Jimmye Norman 11/22-11/25/23: Pt presented w/ shortness of breath and was found to have CHF exacerbation & NSTEMI. Pt was initially treated w/ IV lasix for CHF exacerbation but it has been held a couple of days for increasing Cr. Of note, pt was treated w/ medical management for NSTEMI but pt will go for cardiac cath on 12/22/21 as per cardio  Assessment and Plan: * Acute on chronic combined systolic and diastolic CHF (congestive heart failure) (HCC) Holding diuresis with elevated creatinine.  EF 25 to 30% with moderate aortic stenosis.  Continue metoprolol.  NSTEMI (non-ST elevated myocardial infarction) (HCC) Continue aspirin, Plavix and metoprolol.  Allergy to statin on Zetia.  Acute kidney injury superimposed on CKD ([place]) Acute kidney injury on CKD stage IIIb.  Today's creatinine 2.08.  Creatinine on presentation 1.66.  Anemia, unspecified Last hemoglobin 9.5.  We will check a ferritin tomorrow morning.  Acute respiratory failure (HCC) This has improved.  The patient had respiratory distress on presentation requiring BiPAP.  Hypothyroidism Continue Synthroid        Subjective: Patient had chest pain and sweating last night.  Patient will go for cardiac catheterization tomorrow with cardiology.  Treating for heart failure, acute kidney injury and NSTEMI.  Physical Exam: Vitals:   12/21/21 0148 12/21/21 0500 12/21/21 0900 12/21/21 1301  BP: (!) 112/58  105/79 (!) 121/53  Pulse: 92  88 75  Resp:   20 16  Temp:  98.4 F (36.9 C) 98.2 F (36.8 C)  TempSrc:    Oral  SpO2:   98% 100%  Weight:  63.3 kg    Height:       Physical Exam HENT:     Head: Normocephalic.      Mouth/Throat:     Pharynx: No oropharyngeal exudate.  Eyes:     General: Lids are normal.     Conjunctiva/sclera: Conjunctivae normal.  Cardiovascular:     Rate and Rhythm: Normal rate and regular rhythm.     Heart sounds: S1 normal. Heart sounds are distant. Murmur heard.     Systolic murmur is present with a grade of 4/6.  Pulmonary:     Breath sounds: Examination of the right-lower field reveals decreased breath sounds. Examination of the left-lower field reveals decreased breath sounds. Decreased breath sounds present. No wheezing, rhonchi or rales.  Abdominal:     Palpations: Abdomen is soft.     Tenderness: There is no abdominal tenderness.  Musculoskeletal:     Right lower leg: Swelling present.     Left lower leg: Swelling present.  Skin:    General: Skin is warm.     Findings: No rash.  Neurological:     Mental Status: She is alert and oriented to person, place, and time.     Data Reviewed: EF 25 to 30% with moderate aortic stenosis.  Creatinine 2.08, hemoglobin 9.5  Family Communication: Left message for patient's daughter  Disposition: Status is: Inpatient Remains inpatient appropriate because: For cardiac catheterization tomorrow  Planned Discharge Destination: Home    Time spent: 28 minutes  Author: Loletha Grayer, MD 12/21/2021 3:57 PM  For on call review [email or web address].

## 2021-12-21 NOTE — Assessment & Plan Note (Signed)
This has improved.  The patient had respiratory distress on presentation requiring BiPAP.

## 2021-12-22 ENCOUNTER — Encounter
Admission: EM | Disposition: A | Discharge status: Home Health | Payer: Self-pay | Source: Home / Self Care | Attending: Internal Medicine

## 2021-12-22 DIAGNOSIS — I5043 Acute on chronic combined systolic (congestive) and diastolic (congestive) heart failure: Secondary | ICD-10-CM | POA: Diagnosis not present

## 2021-12-22 DIAGNOSIS — I350 Nonrheumatic aortic (valve) stenosis: Secondary | ICD-10-CM

## 2021-12-22 DIAGNOSIS — I214 Non-ST elevation (NSTEMI) myocardial infarction: Secondary | ICD-10-CM | POA: Diagnosis not present

## 2021-12-22 DIAGNOSIS — J9601 Acute respiratory failure with hypoxia: Secondary | ICD-10-CM | POA: Diagnosis not present

## 2021-12-22 DIAGNOSIS — I2510 Atherosclerotic heart disease of native coronary artery without angina pectoris: Secondary | ICD-10-CM

## 2021-12-22 DIAGNOSIS — D509 Iron deficiency anemia, unspecified: Secondary | ICD-10-CM | POA: Insufficient documentation

## 2021-12-22 HISTORY — PX: RIGHT/LEFT HEART CATH AND CORONARY ANGIOGRAPHY: CATH118266

## 2021-12-22 LAB — VITAMIN B12: Vitamin B-12: 370 pg/mL (ref 180–914)

## 2021-12-22 LAB — POCT I-STAT EG7
Acid-base deficit: 1 mmol/L (ref 0.0–2.0)
Bicarbonate: 24.3 mmol/L (ref 20.0–28.0)
Calcium, Ion: 1.27 mmol/L (ref 1.15–1.40)
HCT: 28 % — ABNORMAL LOW (ref 36.0–46.0)
Hemoglobin: 9.5 g/dL — ABNORMAL LOW (ref 12.0–15.0)
O2 Saturation: 65 %
Potassium: 4 mmol/L (ref 3.5–5.1)
Sodium: 140 mmol/L (ref 135–145)
TCO2: 25 mmol/L (ref 22–32)
pCO2, Ven: 39.6 mmHg — ABNORMAL LOW (ref 44–60)
pH, Ven: 7.396 (ref 7.25–7.43)
pO2, Ven: 34 mmHg (ref 32–45)

## 2021-12-22 LAB — BASIC METABOLIC PANEL
Anion gap: 9 (ref 5–15)
BUN: 44 mg/dL — ABNORMAL HIGH (ref 8–23)
CO2: 23 mmol/L (ref 22–32)
Calcium: 9.3 mg/dL (ref 8.9–10.3)
Chloride: 109 mmol/L (ref 98–111)
Creatinine, Ser: 1.86 mg/dL — ABNORMAL HIGH (ref 0.44–1.00)
GFR, Estimated: 27 mL/min — ABNORMAL LOW (ref >60)
Glucose, Bld: 102 mg/dL — ABNORMAL HIGH (ref 70–99)
Potassium: 4.1 mmol/L (ref 3.5–5.1)
Sodium: 141 mmol/L (ref 135–145)

## 2021-12-22 LAB — POCT I-STAT 7, (LYTES, BLD GAS, ICA,H+H)
Acid-base deficit: 1 mmol/L (ref 0.0–2.0)
Bicarbonate: 23.5 mmol/L (ref 20.0–28.0)
Calcium, Ion: 1.3 mmol/L (ref 1.15–1.40)
HCT: 29 % — ABNORMAL LOW (ref 36.0–46.0)
Hemoglobin: 9.9 g/dL — ABNORMAL LOW (ref 12.0–15.0)
O2 Saturation: 92 %
Potassium: 4.1 mmol/L (ref 3.5–5.1)
Sodium: 140 mmol/L (ref 135–145)
TCO2: 25 mmol/L (ref 22–32)
pCO2 arterial: 38.2 mmHg (ref 32–48)
pH, Arterial: 7.398 (ref 7.35–7.45)
pO2, Arterial: 65 mmHg — ABNORMAL LOW (ref 83–108)

## 2021-12-22 LAB — LIPID PANEL
Cholesterol: 189 mg/dL (ref 0–200)
HDL: 68 mg/dL (ref >40)
LDL Cholesterol: 105 mg/dL — ABNORMAL HIGH (ref 0–99)
Total CHOL/HDL Ratio: 2.8 RATIO
Triglycerides: 79 mg/dL (ref <150)
VLDL: 16 mg/dL (ref 0–40)

## 2021-12-22 LAB — FERRITIN: Ferritin: 25 ng/mL (ref 11–307)

## 2021-12-22 LAB — GLUCOSE, CAPILLARY: Glucose-Capillary: 110 mg/dL — ABNORMAL HIGH (ref 70–99)

## 2021-12-22 SURGERY — RIGHT/LEFT HEART CATH AND CORONARY ANGIOGRAPHY
Anesthesia: Moderate Sedation

## 2021-12-22 MED ORDER — VERAPAMIL HCL 2.5 MG/ML IV SOLN
INTRAVENOUS | Status: DC | PRN
  Administered 2021-12-22: 2.5 mg via INTRA_ARTERIAL

## 2021-12-22 MED ORDER — SODIUM CHLORIDE 0.9 % WEIGHT BASED INFUSION: 3.0000 mL/kg/h | INTRAVENOUS | Status: DC

## 2021-12-22 MED ORDER — FENTANYL CITRATE (PF) 100 MCG/2ML IJ SOLN
INTRAMUSCULAR | Status: AC
  Filled 2021-12-22: qty 2

## 2021-12-22 MED ORDER — HEPARIN (PORCINE) IN NACL 1000-0.9 UT/500ML-% IV SOLN
INTRAVENOUS | Status: AC
  Filled 2021-12-22: qty 1000

## 2021-12-22 MED ORDER — ISOSORBIDE MONONITRATE ER 30 MG PO TB24
15.0000 mg | ORAL_TABLET | Freq: Every day | ORAL | Status: DC
  Administered 2021-12-23: 15 mg via ORAL
  Filled 2021-12-22: qty 1

## 2021-12-22 MED ORDER — SODIUM CHLORIDE 0.9 % WEIGHT BASED INFUSION: 1.0000 mL/kg/h | INTRAVENOUS | Status: DC

## 2021-12-22 MED ORDER — IOHEXOL 300 MG/ML  SOLN
INTRAMUSCULAR | Status: DC | PRN
  Administered 2021-12-22: 34 mL

## 2021-12-22 MED ORDER — SODIUM CHLORIDE 0.9% FLUSH: 3.0000 mL | INTRAVENOUS | Status: DC | PRN

## 2021-12-22 MED ORDER — SODIUM CHLORIDE 0.9 % IV SOLN: 250.0000 mL | INTRAVENOUS | Status: DC | PRN

## 2021-12-22 MED ORDER — FENTANYL CITRATE (PF) 100 MCG/2ML IJ SOLN
INTRAMUSCULAR | Status: DC | PRN
  Administered 2021-12-22: 25 ug via INTRAVENOUS

## 2021-12-22 MED ORDER — ACETAMINOPHEN 325 MG PO TABS: 650.0000 mg | ORAL_TABLET | ORAL | Status: DC | PRN

## 2021-12-22 MED ORDER — METOPROLOL TARTRATE 25 MG PO TABS
25.0000 mg | ORAL_TABLET | Freq: Two times a day (BID) | ORAL | Status: DC
  Administered 2021-12-23: 25 mg via ORAL
  Filled 2021-12-22 (×2): qty 1

## 2021-12-22 MED ORDER — LIDOCAINE HCL (PF) 1 % IJ SOLN
INTRAMUSCULAR | Status: DC | PRN
  Administered 2021-12-22 (×2): 2 mL

## 2021-12-22 MED ORDER — ONDANSETRON HCL 4 MG/2ML IJ SOLN: 4.0000 mg | Freq: Four times a day (QID) | INTRAMUSCULAR | Status: DC | PRN

## 2021-12-22 MED ORDER — LIDOCAINE HCL 1 % IJ SOLN
INTRAMUSCULAR | Status: AC
  Filled 2021-12-22: qty 20

## 2021-12-22 MED ORDER — MIDAZOLAM HCL 2 MG/2ML IJ SOLN
INTRAMUSCULAR | Status: AC
  Filled 2021-12-22: qty 2

## 2021-12-22 MED ORDER — ASPIRIN 81 MG PO CHEW: 81.0000 mg | CHEWABLE_TABLET | ORAL | Status: DC

## 2021-12-22 MED ORDER — FERROUS SULFATE 325 (65 FE) MG PO TABS
325.0000 mg | ORAL_TABLET | Freq: Every day | ORAL | Status: DC
  Administered 2021-12-23: 325 mg via ORAL
  Filled 2021-12-22 (×2): qty 1

## 2021-12-22 MED ORDER — HEPARIN (PORCINE) IN NACL 1000-0.9 UT/500ML-% IV SOLN
INTRAVENOUS | Status: DC | PRN
  Administered 2021-12-22: 1000 mL

## 2021-12-22 MED ORDER — HEPARIN SODIUM (PORCINE) 1000 UNIT/ML IJ SOLN
INTRAMUSCULAR | Status: DC | PRN
  Administered 2021-12-22: 3000 [IU] via INTRAVENOUS

## 2021-12-22 MED ORDER — SODIUM CHLORIDE 0.9 % WEIGHT BASED INFUSION
3.0000 mL/kg/h | INTRAVENOUS | Status: DC
  Administered 2021-12-22: 3 mL/kg/h via INTRAVENOUS

## 2021-12-22 MED ORDER — HEPARIN SODIUM (PORCINE) 1000 UNIT/ML IJ SOLN
INTRAMUSCULAR | Status: AC
  Filled 2021-12-22: qty 10

## 2021-12-22 MED ORDER — VERAPAMIL HCL 2.5 MG/ML IV SOLN
INTRAVENOUS | Status: AC
  Filled 2021-12-22: qty 2

## 2021-12-22 MED ORDER — MIDAZOLAM HCL 2 MG/2ML IJ SOLN
INTRAMUSCULAR | Status: DC | PRN
  Administered 2021-12-22: 1 mg via INTRAVENOUS

## 2021-12-22 MED ORDER — SODIUM CHLORIDE 0.9% FLUSH: 3.0000 mL | Freq: Two times a day (BID) | INTRAVENOUS | Status: DC

## 2021-12-22 SURGICAL SUPPLY — 16 items
BAND ZEPHYR COMPRESS 30 LONG (HEMOSTASIS) IMPLANT
CATH INFINITI 5FR AL1 (CATHETERS) IMPLANT
CATH INFINITI 5FR JK (CATHETERS) IMPLANT
CATH JL3.5 FR DIAG (CATHETERS) IMPLANT
CATH SWAN GANZ 7F STRAIGHT (CATHETERS) IMPLANT
DRAPE BRACHIAL (DRAPES) IMPLANT
GLIDESHEATH SLEND SS 6F .021 (SHEATH) IMPLANT
GLIDESHEATH SLENDER 7FR .021G (SHEATH) IMPLANT
GUIDEWIRE INQWIRE 1.5J.035X260 (WIRE) IMPLANT
INQWIRE 1.5J .035X260CM (WIRE) ×1
PACK CARDIAC CATH (CUSTOM PROCEDURE TRAY) ×1 IMPLANT
PROTECTION STATION PRESSURIZED (MISCELLANEOUS) ×1
SET ATX SIMPLICITY (MISCELLANEOUS) IMPLANT
STATION PROTECTION PRESSURIZED (MISCELLANEOUS) IMPLANT
WIRE EMERALD ST .035X150CM (WIRE) IMPLANT
WIRE HITORQ VERSACORE ST 145CM (WIRE) IMPLANT

## 2021-12-22 NOTE — Assessment & Plan Note (Addendum)
Last hemoglobin 9.8.  Ferritin 25.  Iron started.  Watch blood counts closely as outpatient being that she is on blood thinners.

## 2021-12-22 NOTE — Assessment & Plan Note (Signed)
Cardiology recommending outpatient management for possible TAVR procedure.

## 2021-12-22 NOTE — Interval H&P Note (Signed)
History and Physical Interval Note:  12/22/2021 2:00 PM  Tracy Silva  has presented today for surgery, with the diagnosis of as chf nstemi.  The various methods of treatment have been discussed with the patient and family. After consideration of risks, benefits and other options for treatment, the patient has consented to  Procedure(s): RIGHT/LEFT HEART CATH AND CORONARY ANGIOGRAPHY (N/A) as a surgical intervention.  The patient's history has been reviewed, patient examined, no change in status, stable for surgery.  I have reviewed the patient's chart and labs.  Questions were answered to the patient's satisfaction.     Kathlyn [place]

## 2021-12-22 NOTE — Progress Notes (Addendum)
Patient was seen evaluated and examined by me and the PA on 12/22/21.  Course of action, evaluation, and management decisions were developed solely by me, but detailed below in the PA's note. [place] NOTE       Patient ID: WALTER MIN MRN: [id number] DOB/AGE: 83-May-1942 83 y.o.  Admit date: 12/16/2021 Referring Physician Donell Beers, NP  Primary Physician Dr. Alba Cory Primary Cardiologist Dr. Saralyn Pilar Reason for Consultation SOB query new CHF vs htn urgency  HPI: Jenee Spaugh is an 81yoF with a PMH of HTN, DM2, chronic LBBB, hypothyroidism, hx rectal cancer s/p colectomy (2018) who presented to [facility] ED 12/16/2021 in the early morning hours in respiratory distress requiring BiPAP in the emergency department.  Cardiology is consulted for further assistance characterization of the etiology of her shortness of breath. TTE revealed a new HFrEF (25-30% & global hypo) with moderate aortic stenosis (possibly low flow & severe AS since her EF is poor) of uncertain chronicity (no priors for comparison).   Interval History: -feels good today. No further chest pain or shortness of breath today.  -renal function improved slightly with 530m bolus of fluids  [facility] Anon[facility] 12[facility]00[facility]. I discussed risks & benefits of this procedure with the patient in detail at bedside.    Review of systems complete and found to be negative unless listed above     Past Medical History:  Diagnosis Date   Acid reflux    Anemia    H/O AS A CHILD   Arthritis    FINGERS AND SHOULDER-RIGHT   Asthma    AS A CHILD   Diabetes mellitus without complication ([place]    Heart murmur    ASYMPTOMATIC   High cholesterol    Hypertension    Hypothyroidism    Other and unspecified noninfectious gastroenteritis and colitis(558.9) 06/27/2012   Rectal bleeding 2011   Rectal cancer ([place] 07/16/2016    Past Surgical History:  Procedure Laterality Date   COLONOSCOPY   2011   X2   COLONOSCOPY N/A 07/27/2016   Procedure: COLONOSCOPY IN O.R;  Surgeon: BRobert Bellow MD;  Location: [facility] ORS;  Service: General;  Laterality: N/A;   IUD REMOVAL     LAPAROSCOPIC PARTIAL COLECTOMY Left 08/21/2016   Procedure: LAPAROSCOPIC ASSISTED LEFT HEMI-COLECTOMY;  Surgeon: BRobert Bellow MD;  Location: [facility] ORS;  Service: General;  Laterality: Left;   RECTAL EXAM UNDER ANESTHESIA N/A 07/27/2016   Procedure: RECTAL EXAM UNDER ANESTHESIA;  Surgeon: BRobert Bellow MD;  Location: [facility] ORS;  Service: General;  Laterality: N/A;   TONSILLECTOMY     TOOTH EXTRACTION     TRANSANAL EXCISION OF RECTAL MASS  07/27/2016   Procedure: EXCISION OF RECTAL MASS;  Surgeon: BRobert Bellow MD;  Location: [facility] ORS;  Service: General;;   WISDOM TOOTH EXTRACTION      Medications Prior to Admission  Medication Sig Dispense Refill Last Dose   ezetimibe (ZETIA) 10 MG tablet TAKE 1 TABLET BY MOUTH EVERY MORNING 90 tablet 3 Past Week   levothyroxine (SYNTHROID) 75 MCG tablet TAKE ONE TABLET ON AN EMPTY STOMACH WITHA GLASS OF WATER AT LEAST 30 TO 60 MINUTES BEFORE BREAKFAST 30 tablet 9 Past Week   metoprolol succinate (TOPROL-XL) 25 MG 24 hr tablet Take 1 tablet (25 mg total) by mouth daily. 90 tablet 1 Past Week   Multiple Vitamins-Calcium (ONE-A-DAY WOMENS PO) Take by mouth daily.   Past Week   omeprazole (PRILOSEC OTC)  20 MG tablet Take 20 mg by mouth daily as needed.   Past Week   Social History   Socioeconomic History   Marital status: Married    Spouse name: Not on file   Number of children: Not on file   Years of education: Not on file   Highest education level: Not on file  Occupational History   Not on file  Tobacco Use   Smoking status: Former    Packs/day: 0.50    Years: 4.00    Total pack years: 2.00    Types: Cigarettes    Quit date: 01/26/1971    Years since quitting: 50.9   Smokeless tobacco: Never  Vaping Use   Vaping Use: Never used  Substance and Sexual  Activity   Alcohol use: Yes    Comment: occasionally with dinner   Drug use: No   Sexual activity: Not on file  Other Topics Concern   Not on file  Social History Narrative   Not on file   Social Determinants of Health   Financial Resource Strain: Not on file  Food Insecurity: No Food Insecurity (12/17/2021)   Hunger Vital Sign    Worried About Running Out of Food in the Last Year: Never true    [place] in the Last Year: Never true  Transportation Needs: No Transportation Needs (12/17/2021)   PRAPARE - Hydrologist (Medical): No    Lack of Transportation (Non-Medical): No  Physical Activity: Not on file  Stress: Not on file  Social Connections: Not on file  Intimate Partner Violence: Not At Risk (12/17/2021)   Humiliation, Afraid, Rape, and Kick questionnaire    Fear of Current or Ex-Partner: No    Emotionally Abused: No    Physically Abused: No    Sexually Abused: No    Family History  Problem Relation Age of Onset   Pancreatic cancer Mother    Breast cancer Paternal Aunt    Cancer Maternal Aunt      Vitals:   12/21/21 1301 12/21/21 2037 12/22/21 0607 12/22/21 0812  BP: (!) 121/53 (!) 141/58 (!) 97/54 (!) 112/56  Pulse: 75 80 70 67  Resp: 16   17  Temp: 98.2 F (36.8 C) 98.4 F (36.9 C) 97.8 F (36.6 C) 98.1 F (36.7 C)  TempSrc: Oral Oral Oral Oral  SpO2: 100% 99% 99% 96%  Weight:      Height:        PHYSICAL EXAM General: Pleasant elderly Caucasian female, well nourished, in no acute distress.  Sitting upright in PCU bed. No family at bedside. HEENT:  Normocephalic and atraumatic. Neck:  No JVD. R carotid bruit present Lungs: Normal respiratory effort on room air.  Trace bibasilar crackles, overall improving.   Heart: HRRR . Normal S1 and soft S2.  3/6 systolic murmur best heard at the RUSB.  Abdomen: Non-distended appearing.  Msk: Normal strength and tone for age. Extremities: Warm and well perfused. No clubbing,  cyanosis.  bilateral LE with chronic venous stasis hyperpigmentation and trace edema.  Neuro: Alert and oriented X 3. Psych:  Answers questions appropriately.   Labs: Basic Metabolic Panel: Recent Labs    12/21/21 0501 12/22/21 0434  NA 141 141  K 3.9 4.1  CL 107 109  CO2 24 23  GLUCOSE 107* 102*  BUN 47* 44*  CREATININE 2.08* 1.86*  CALCIUM 9.3 9.3    Liver Function Tests: No results for input(s): "AST", "ALT", "ALKPHOS", "BILITOT", "  PROT", "ALBUMIN" in the last 72 hours.  No results for input(s): "LIPASE", "AMYLASE" in the last 72 hours. CBC: Recent Labs    12/20/21 0440 12/21/21 0501  WBC 4.0 4.9  HGB 9.1* 9.5*  HCT 27.9* 28.3*  MCV 100.4* 101.1*  PLT 197 197    Cardiac Enzymes: No results for input(s): "CKTOTAL", "CKMB", "CKMBINDEX", "TROPONINIHS" in the last 72 hours.  BNP: No results for input(s): "BNP" in the last 72 hours.  D-Dimer: No results for input(s): "DDIMER" in the last 72 hours. Hemoglobin A1C: No results for input(s): "HGBA1C" in the last 72 hours. Fasting Lipid Panel: No results for input(s): "CHOL", "HDL", "LDLCALC", "TRIG", "CHOLHDL", "LDLDIRECT" in the last 72 hours. Thyroid Function Tests: No results for input(s): "TSH", "T4TOTAL", "T3FREE", "THYROIDAB" in the last 72 hours.  Invalid input(s): "FREET3"  Anemia Panel: Recent Labs    12/22/21 0434  FERRITIN 25      Radiology: ECHOCARDIOGRAM COMPLETE  Result Date: 12/16/2021    ECHOCARDIOGRAM REPORT   Patient Name:   KONA YUSUF Date of Exam: 12/16/2021 Medical Rec #:  [id number]       Height:       63.0 in Accession #:    [id number]      Weight:       155.4 lb Date of Birth:  January 03, 1941       BSA:          1.737 m Patient Age:    3 years        BP:           135/62 mmHg Patient Gender: F               HR:           88 bpm. Exam Location:  [facility] Procedure: 2D Echo, Color Doppler and Cardiac Doppler Indications:     I42.9 Cardiomyopathy-unspecified  History:         Patient has no  prior history of Echocardiogram examinations.                  Risk Factors:Hypertension, Diabetes and HCL.  Sonographer:     Charmayne Sheer Referring Phys:  [id number] Urania TANG Diagnosing Phys: Serafina Royals MD IMPRESSIONS  1. Left ventricular ejection fraction, by estimation, is 25 to 30%. The left ventricle has severely decreased function. The left ventricle demonstrates global hypokinesis. The left ventricular internal cavity size was moderately dilated. There is moderate left ventricular hypertrophy. Left ventricular diastolic parameters are consistent with Grade I diastolic dysfunction (impaired relaxation).  2. Right ventricular systolic function is normal. The right ventricular size is normal.  3. Left atrial size was mildly dilated.  4. The mitral valve is normal in structure. Mild to moderate mitral valve regurgitation.  5. Tricuspid valve regurgitation is mild to moderate.  6. The aortic valve is calcified. Aortic valve regurgitation is mild. Moderate aortic valve stenosis. FINDINGS  Left Ventricle: Left ventricular ejection fraction, by estimation, is 25 to 30%. The left ventricle has severely decreased function. The left ventricle demonstrates global hypokinesis. The left ventricular internal cavity size was moderately dilated. There is moderate left ventricular hypertrophy. Left ventricular diastolic parameters are consistent with Grade I diastolic dysfunction (impaired relaxation). Right Ventricle: The right ventricular size is normal. No increase in right ventricular wall thickness. Right ventricular systolic function is normal. Left Atrium: Left atrial size was mildly dilated. Right Atrium: Right atrial size was normal in size. Pericardium: There is no evidence of pericardial  effusion. Mitral Valve: The mitral valve is normal in structure. Mild to moderate mitral valve regurgitation. Tricuspid Valve: The tricuspid valve is normal in structure. Tricuspid valve regurgitation is mild to moderate.  Aortic Valve: The aortic valve is calcified. Aortic valve regurgitation is mild. Aortic regurgitation PHT measures 244 msec. Moderate aortic stenosis is present. Aortic valve mean gradient measures 29.0 mmHg. Aortic valve peak gradient measures 46.4 mmHg. Aortic valve area, by VTI measures 0.69 cm. Pulmonic Valve: The pulmonic valve was normal in structure. Pulmonic valve regurgitation is trivial. Aorta: The aortic root and ascending aorta are structurally normal, with no evidence of dilitation. IAS/Shunts: No atrial level shunt detected by color flow Doppler.  LEFT VENTRICLE PLAX 2D LVIDd:         3.70 cm      Diastology LVIDs:         3.50 cm      LV e' medial:    4.13 cm/s LV PW:         0.90 cm      LV E/e' medial:  42.5 LV IVS:        0.90 cm      LV e' lateral:   16.90 cm/s LVOT diam:     1.80 cm      LV E/e' lateral: 10.4 LV SV:         55 LV SV Index:   31 LVOT Area:     2.54 cm  LV Volumes (MOD) LV vol d, MOD A2C: 96.0 ml LV vol d, MOD A4C: 108.0 ml LV vol s, MOD A2C: 59.3 ml LV vol s, MOD A4C: 79.8 ml LV SV MOD A2C:     36.7 ml LV SV MOD A4C:     108.0 ml LV SV MOD BP:      34.4 ml RIGHT VENTRICLE RV Basal diam:  3.30 cm RV S prime:     10.70 cm/s TAPSE (M-mode): 2.1 cm LEFT ATRIUM             Index        RIGHT ATRIUM           Index LA diam:        3.50 cm 2.01 cm/m   RA Area:     11.30 cm LA Vol (A2C):   41.4 ml 23.83 ml/m  RA Volume:   25.60 ml  14.74 ml/m LA Vol (A4C):   38.8 ml 22.34 ml/m LA Biplane Vol: 40.0 ml 23.03 ml/m  AORTIC VALVE                     PULMONIC VALVE AV Area (Vmax):    0.85 cm      PV Vmax:       2.68 m/s AV Area (Vmean):   0.79 cm      PV Vmean:      207.000 cm/s AV Area (VTI):     0.69 cm      PV VTI:        0.631 m AV Vmax:           340.50 cm/s   PV Peak grad:  28.7 mmHg AV Vmean:          258.000 cm/s  PV Mean grad:  18.0 mmHg AV VTI:            0.791 m AV Peak Grad:      46.4 mmHg AV Mean Grad:      29.0 mmHg LVOT Vmax:  114.00 cm/s LVOT Vmean:         80.000 cm/s LVOT VTI:          0.215 m LVOT/AV VTI ratio: 0.27 AI PHT:            244 msec  AORTA Ao Root diam: 3.10 cm MITRAL VALVE MV Area (PHT): 5.44 cm     SHUNTS MV Decel Time: 139 msec     Systemic VTI:  0.22 m MV E velocity: 175.33 cm/s  Systemic Diam: 1.80 cm Serafina Royals MD Electronically signed by Serafina Royals MD Signature Date/Time: 12/16/2021/4:52:01 PM    Final    DG Chest Portable 1 View  Result Date: 12/16/2021 CLINICAL DATA:  81 year old female with chest pain for 3 days and shortness of breath. Retractions. EXAM: PORTABLE CHEST 1 VIEW COMPARISON:  CT Abdomen and Pelvis 07/22/2016. FINDINGS: Portable AP upright view at 0424 hours. Calcified aortic atherosclerosis. Cardiac size appears to remain normal. Indistinct appearance of the bilateral hila. Relatively large lung volumes with widespread bilateral pulmonary interstitial opacity, coarse in the perihilar regions. No superimposed pneumothorax or consolidation. No pleural effusion identified. Combined disc like and pin-like metallic foreign body projects over the midline in the aortic arch region partially obscuring the trachea. Etiology and significance is unclear. No acute osseous abnormality identified. Negative visible bowel gas. IMPRESSION: 1. Widespread bilateral pulmonary interstitial opacity with no definite pleural fluid. Consider Bilateral Pneumonia versus Pulmonary Edema. Viral/atypical infectious etiology possible. 2. Combined disc- and pin-like metallic foreign body projects over the midline in the aortic arch region, etiology and significance unclear. 3. Aortic Atherosclerosis (ICD10-I70.0). Electronically Signed   By: Genevie Ann M.D.   On: 12/16/2021 04:52    TELEMETRY reviewed by me (LT) 12/22/2021 : SR 60  EKG reviewed by me: NSR LBBB rate 96  Data reviewed by me (LT) 12/22/2021: Hospitalist progress note, PT and OT notes CBC BMP vitals telemetry and troponins I's and O's  Principal Problem:   Acute on chronic  combined systolic and diastolic CHF (congestive heart failure) (HCC) Active Problems:   Hypothyroidism   Acute respiratory failure (HCC)   NSTEMI (non-ST elevated myocardial infarction) ([place])   Acute kidney injury superimposed on CKD (HCC)   Anemia, unspecified    ASSESSMENT AND PLAN:  Donata Duff is an 40yoF with a PMH of HTN, DM2, chronic LBBB, hypothyroidism, hx rectal cancer s/p colectomy (2018) who presented to [facility] ED 12/16/2021 in the early morning hours in respiratory distress requiring BiPAP in the emergency department.  Cardiology is consulted for further assistance characterization of the etiology of her shortness of breath. TTE revealed a new HFrEF (25-30% & global hypo) with moderate aortic stenosis (possibly low flow & severe AS since her EF is poor) of uncertain chronicity (no priors for comparison)  # Acute hypoxic respiratory failure # Hypertensive urgency On 11/8 her PCP discontinued amlodipine-benazepril & HCTZ, and decreased her dose of metoprolol XL from 50 to 25 mg due to orthostatic hypotension.  She presents with a 2-week history of worsening dyspnea on exertion and exertional chest discomfort was hypoxic to the 80s on room air, requiring CPAP & BIPAP on admission. She was extremely hypertensive (176/96) and tachypneic with CXR demonstrating significant pulmonary edema.  She has been weaned from BiPAP to 6 L by HFNC on 11/21.  -Weaned to room air today. BP better controlled.  # moderate AS, probably low flow (possibly severe AS)  # new HFrEF (25-30%, global hypo) BNP elevated to 1055 with pulmonary  edema on CXR and a significant O2 requirement with none at baseline.   -echo complete resulted above with global hypo and significantly reduced EF with moderate AS, query severe AS since her EF is poor.  -s/p IV lasix '60mg'$  x1 & lasix '40mg'$  x 2 with excellent diuresis but bump in Cr.  -decrease metoprolol tartrate back to '25mg'$  once daily  -restart Imdur 15 mg and  hydralazine 10 mg twice daily for afterload reduction -consider further excalation in GDMT (ACEi/ARB, MRA, Sglt2i), so far her renal function has limited this -strict I/O, salt restriction   # NSTEMI # chronic LBBB Troponins elevated and uptrending from 19, 104, 1681, 2256, 2959, 1877 with exertional angina in several days prior to admission. EKG redemonstrates her chronic LBBB without other acute ischemic changes. Trop elevation concerning for NSTEMI with significant delta and recurrence in chest discomfort on 11/26, resolving w/ SL NTG x2. -s/p '325mg'$  ASA, continue aspirin '81mg'$  & clopidogrel '75mg'$  daily -s/p heparin infusion for 48 hours, ending 11/23 at 1400  -continue metoprolol -continue zetia '10mg'$ , reportedly intolerant of "statins" unclear which ones she has tried  -plan for [facility] with Dr. Fletcher Anon today for further eval of her coronaries and aortic valve. Discussed the risks (including bleeding, AKI, MI, death), benefits, and alternatives and the patient is agreeable to proceed.   # AKI on CKD 3  Hold further diuresis. S/p 546m bolus of fluids 11/26 with improvement in renal function. Monitor closely after cath   # hypothyroidism  TSH elevated at 18, T4 wnl.  -on synthroid 755m daily, mgmt per primary   This patient's plan of care was discussed and created with Dr. CaClayborn Bignessnd he is in agreement.  Signed: LiTristan Schroeder PA-C 12/22/2021, 10:30 AM [facility]

## 2021-12-22 NOTE — Progress Notes (Signed)
Progress Note   Patient: Tracy Silva:[id number] DOB: [birth year] DOA: 12/16/2021     6 DOS: the patient was seen and examined on 12/22/2021   Brief hospital course: 81 yo F presenting to [facility] ED from home via EMS with complaints of CP and dyspnea over the last 2-3 days. She reports intermittent CP with some dyspnea that she thought might be asthma related, this CP was experienced both at rest and with activity. She describes chest pain as 8/10 mid-sternal anterior pressure that is not reproducible. She denies abdominal pain/ nausea (*however was given Zofran x 2 in ED)/ vomiting/ diarrhea, she denies urinary symptoms, fever/chills (but is currently shivering requiring 7 blankets). She woke up suddenly with dyspnea in the middle of the night prompting her to call EMS. Upon EMS arrival SpO2 in the 80's on RA, placed on CPAP for transport.   ED course: Upon arrival patient is tachycardic, tachypneic, hypertensive & in respiratory distress. Patient placed on BIPAP support. Patient with diffuse rales on exam. Treated for hypertensive urgency, flash pulmonary edema, acute CHF exacerbation. Medications given: fentanyl, Lasix, hydralazine, ativan, nitro paste, zofran Initial Vitals: 97.5, 30, 120, 155/75 & 100% on BIPAP 70% Significant labs: (Labs/ Imaging personally reviewed) I, Domingo Pulse [place], AGACNP-BC, personally viewed and interpreted this ECG. EKG Interpretation: Date: 12/16/21, EKG Time: 04:10, Rate: 146, Rhythm: ST, QRS Axis:  LAD, Intervals: LBBB, ST/T Wave abnormalities: none, Narrative Interpretation: ST with LBBB (baseline LBBB) Chemistry: Na+: 142, K+: 4.0, BUN/Cr.: 33/1.66, Serum CO2/ AG: 22/9 Hematology: WBC:  7.6, Hgb: 9.7,  Troponin: 19, BNP: 1055, PCT: <0.10, COVID-19 & Influenza A/B: negative   ABG: 7.25/ 50/ 35/ 21.9 CXR 12/16/21: Widespread bilateral pulmonary interstitial opacity with no definite pleural fluid. Consider Bilateral Pneumonia versus Pulmonary Edema.  Viral/atypical infectious etiology possible. Combined disc- and pin-like metallic foreign body projects over the midline in the aortic arch region, etiology and significance unclear.   PCCM consulted for admission due to acute hypoxic respiratory failure requiring BIPAP with high risk for intubation.   As per Dr. Jimmye Norman 11/22-11/25/23: Pt presented w/ shortness of breath and was found to have CHF exacerbation & NSTEMI. Pt was initially treated w/ IV lasix for CHF exacerbation but it has been held a couple of days for increasing Cr. Of note, pt was treated w/ medical management for NSTEMI but pt will go for cardiac cath on 12/22/21 as per cardio  Assessment and Plan: * Acute on chronic combined systolic and diastolic CHF (congestive heart failure) (HCC) Holding diuresis with elevated creatinine.  EF 25 to 30% with moderate aortic stenosis.  Continue metoprolol.  Cardiac catheter with severe aortic stenosis.  NSTEMI (non-ST elevated myocardial infarction) (HCC) Continue aspirin, Plavix and metoprolol.  Allergy to statin on Zetia.  Cardiac cath showing severe aortic stenosis and coronary arteries due to Blockages but Not Enough for a Stent..  Severe aortic stenosis Cardiology recommending outpatient management for possible TAVR procedure.  Acute kidney injury superimposed on CKD ([place]) Acute kidney injury on CKD stage IIIb.  Today's creatinine 1.86 creatinine on presentation 1.66.  Check creatinine tomorrow.  Anemia, unspecified Last hemoglobin 9.5.  We will check a ferritin tomorrow morning.  Acute respiratory failure (HCC) This has improved.  The patient had respiratory distress on presentation requiring BiPAP.  Iron deficiency anemia Ferritin 25, going along with iron deficiency anemia.  Last hemoglobin 9.5.  Start oral ferrous sulfate.  Hypothyroidism Continue Synthroid        Subjective: Patient feeling okay.  Seen prior to cardiac catheterization this morning.  Admitted with  heart failure.  Physical Exam: Vitals:   12/22/21 1500 12/22/21 1515 12/22/21 1530 12/22/21 1545  BP: 126/61 104/89 (!) 120/100 (!) 101/50  Pulse: 74 92 85 82  Resp: '18 20 15 20  '$ Temp:      TempSrc:      SpO2: 97% 93% 94% 95%  Weight:      Height:       Physical Exam HENT:     Head: Normocephalic.     Mouth/Throat:     Pharynx: No oropharyngeal exudate.  Eyes:     General: Lids are normal.     Conjunctiva/sclera: Conjunctivae normal.  Cardiovascular:     Rate and Rhythm: Normal rate and regular rhythm.     Heart sounds: S1 normal. Heart sounds are distant. Murmur heard.     Systolic murmur is present with a grade of 4/6.  Pulmonary:     Breath sounds: Examination of the right-lower field reveals decreased breath sounds. Examination of the left-lower field reveals decreased breath sounds. Decreased breath sounds present. No wheezing, rhonchi or rales.  Abdominal:     Palpations: Abdomen is soft.     Tenderness: There is no abdominal tenderness.  Musculoskeletal:     Right lower leg: Swelling present.     Left lower leg: Swelling present.  Skin:    General: Skin is warm.     Findings: No rash.  Neurological:     Mental Status: She is alert and oriented to person, place, and time.     Data Reviewed: Creatinine this morning 1.86, LDL 105, hemoglobin 9.5  Family Communication: Updated patient's daughter on the phone  Disposition: Status is: Inpatient Remains inpatient appropriate because: We will need to watch kidney function tomorrow after cardiac catheterization today.  Planned Discharge Destination: Home with home health.    Time spent: 28 minutes Case discussed with cardiology  Author: Loletha Grayer, MD 12/22/2021 4:02 PM  For on call review [email or web address].

## 2021-12-22 NOTE — Progress Notes (Signed)
PT Cancellation Note  Patient Details Name: STELLA BORTLE MRN: [id number] DOB: [birth year]   Cancelled Treatment:     Pt off floor receiving RIGHT/LEFT HEART CATH AND CORONARY ANGIOGRAPHY. Continue PT per POC next available date/time  Josie Dixon 12/22/2021, 2:26 PM

## 2021-12-22 NOTE — Progress Notes (Signed)
Occupational Therapy Treatment Patient Details Name: Tracy Silva MRN: [id number] DOB: 09-Dec-1940 Today's Date: 12/22/2021   History of present illness 81yoF with a PMH of HTN, DM2, chronic LBBB, hypothyroidism, hx rectal cancer s/p colectomy (2018) who presented to [facility] ED 12/16/2021 in the early morning hours in respiratory distress requiring BiPAP in the emergency department.  Cardiology is consulted for further assistance characterization of the etiology of her shortness of breath, query new CHF versus hypertensive urgency   OT comments  Pt seen for OT tx this date. Pt pleasant, denies pain/SOB. Completes bed mobility, ADL transfers from EOB and from std height toilet, all aspects of toileting, and standing grooming tasks with RW and with supervision. Pt required 1 VC for use of grab bar to assist with descent onto toilet. Progressing towards goals. Pt reports cardiac cath procedure today. Making good progress towards goals and will continue to benefit from skilled OT services.    Recommendations for follow up therapy are one component of a multi-disciplinary discharge planning process, led by the attending physician.  Recommendations may be updated based on patient status, additional functional criteria and insurance authorization.    Follow Up Recommendations  Home health OT     Assistance Recommended at Discharge Intermittent Supervision/Assistance  Patient can return home with the following  A little help with walking and/or transfers;A little help with bathing/dressing/bathroom;Assistance with cooking/housework;Help with stairs or ramp for entrance;Assist for transportation   Equipment Recommendations  Other (comment) (RW)    Recommendations for Other Services      Precautions / Restrictions Precautions Precautions: Fall Restrictions Weight Bearing Restrictions: No       Mobility Bed Mobility Overal bed mobility: Modified Independent             General bed  mobility comments: +effort    Transfers Overall transfer level: Needs assistance Equipment used: Rolling walker (2 wheels) Transfers: Sit to/from Stand Sit to Stand: Supervision                 Balance Overall balance assessment: Needs assistance Sitting-balance support: Feet supported Sitting balance-Leahy Scale: Good     Standing balance support: During functional activity Standing balance-Leahy Scale: Good Standing balance comment: tolerates standing for clothing mgt and standing at sink for washing hands without UE support on RW                           ADL either performed or assessed with clinical judgement   ADL Overall ADL's : Needs assistance/impaired     Grooming: Wash/dry hands;Standing;Supervision/safety               Lower Body Dressing: Maximal assistance;Sitting/lateral leans Lower Body Dressing Details (indicate cue type and reason): MAX A to don socks (her baseline) Toilet Transfer: Supervision/safety;Grab bars;Regular Toilet;Rolling walker (2 wheels) Toilet Transfer Details (indicate cue type and reason): VC for grab bar Toileting- Clothing Manipulation and Hygiene: Supervision/safety;Sitting/lateral lean       Functional mobility during ADLs: Supervision/safety;Rolling walker (2 wheels)      Extremity/Trunk Assessment              Vision       Perception     Praxis      Cognition Arousal/Alertness: Awake/alert Behavior During Therapy: WFL for tasks assessed/performed Overall Cognitive Status: Within Functional Limits for tasks assessed  Exercises      Shoulder Instructions       General Comments      Pertinent Vitals/ Pain       Pain Assessment Pain Assessment: No/denies pain  Home Living                                          Prior Functioning/Environment              Frequency  Min 2X/week        Progress  Toward Goals  OT Goals(current goals can now be found in the care plan section)  Progress towards OT goals: Progressing toward goals  Acute Rehab OT Goals Patient Stated Goal: "I don't want to go back home" OT Goal Formulation: With patient Time For Goal Achievement: 01/01/22 Potential to Achieve Goals: Good  Plan Discharge plan remains appropriate;Frequency remains appropriate    Co-evaluation                 AM-PAC OT "6 Clicks" Daily Activity     Outcome Measure   Help from another person eating meals?: None Help from another person taking care of personal grooming?: A Little Help from another person toileting, which includes using toliet, bedpan, or urinal?: A Little Help from another person bathing (including washing, rinsing, drying)?: A Little Help from another person to put on and taking off regular upper body clothing?: None Help from another person to put on and taking off regular lower body clothing?: A Little 6 Click Score: 20    End of Session Equipment Utilized During Treatment: Rolling walker (2 wheels)  OT Visit Diagnosis: Unsteadiness on feet (R26.81);Repeated falls (R29.6);Muscle weakness (generalized) (M62.81)   Activity Tolerance Patient tolerated treatment well   Patient Left in bed;with call bell/phone within reach;with bed alarm set   Nurse Communication          Time: 6144-3154 OT Time Calculation (min): 12 min  Charges: OT General Charges $OT Visit: 1 Visit OT Treatments $Self Care/Home Management : 8-22 mins  Ardeth Perfect., MPH, MS, OTR/L ascom [phone] 12/22/21, 11:27 AM

## 2021-12-23 ENCOUNTER — Encounter: Payer: Self-pay | Admitting: Cardiovascular Disease

## 2021-12-23 ENCOUNTER — Ambulatory Visit: Payer: Medicare Other | Admitting: Family Medicine

## 2021-12-23 DIAGNOSIS — K219 Gastro-esophageal reflux disease without esophagitis: Secondary | ICD-10-CM

## 2021-12-23 DIAGNOSIS — D509 Iron deficiency anemia, unspecified: Secondary | ICD-10-CM

## 2021-12-23 LAB — BASIC METABOLIC PANEL
Anion gap: 7 (ref 5–15)
BUN: 40 mg/dL — ABNORMAL HIGH (ref 8–23)
CO2: 23 mmol/L (ref 22–32)
Calcium: 9.1 mg/dL (ref 8.9–10.3)
Chloride: 108 mmol/L (ref 98–111)
Creatinine, Ser: 1.8 mg/dL — ABNORMAL HIGH (ref 0.44–1.00)
GFR, Estimated: 28 mL/min — ABNORMAL LOW (ref >60)
Glucose, Bld: 99 mg/dL (ref 70–99)
Potassium: 4.1 mmol/L (ref 3.5–5.1)
Sodium: 138 mmol/L (ref 135–145)

## 2021-12-23 LAB — CBC
HCT: 30.1 % — ABNORMAL LOW (ref 36.0–46.0)
Hemoglobin: 9.8 g/dL — ABNORMAL LOW (ref 12.0–15.0)
MCH: 32.2 pg (ref 26.0–34.0)
MCHC: 32.6 g/dL (ref 30.0–36.0)
MCV: 99 fL (ref 80.0–100.0)
Platelets: 203 10*3/uL (ref 150–400)
RBC: 3.04 MIL/uL — ABNORMAL LOW (ref 3.87–5.11)
RDW: 14.2 % (ref 11.5–15.5)
WBC: 4.3 10*3/uL (ref 4.0–10.5)
nRBC: 0 % (ref 0.0–0.2)

## 2021-12-23 MED ORDER — PANTOPRAZOLE SODIUM 40 MG PO TBEC: 40.0000 mg | DELAYED_RELEASE_TABLET | Freq: Every day | ORAL | 0 refills | Status: AC

## 2021-12-23 MED ORDER — METOPROLOL TARTRATE 25 MG PO TABS: 25.0000 mg | ORAL_TABLET | Freq: Two times a day (BID) | ORAL | 0 refills | Status: DC

## 2021-12-23 MED ORDER — ISOSORBIDE MONONITRATE ER 30 MG PO TB24: 15.0000 mg | ORAL_TABLET | Freq: Every day | ORAL | 0 refills | Status: DC

## 2021-12-23 MED ORDER — ASPIRIN 81 MG PO CHEW: 81.0000 mg | CHEWABLE_TABLET | Freq: Every day | ORAL | 0 refills | Status: DC

## 2021-12-23 MED ORDER — FUROSEMIDE 20 MG PO TABS: 20.0000 mg | ORAL_TABLET | Freq: Every day | ORAL | 0 refills | Status: AC

## 2021-12-23 MED ORDER — FERROUS SULFATE 325 (65 FE) MG PO TABS: 325.0000 mg | ORAL_TABLET | Freq: Every day | ORAL | 0 refills | Status: AC

## 2021-12-23 MED ORDER — CLOPIDOGREL BISULFATE 75 MG PO TABS: 75.0000 mg | ORAL_TABLET | Freq: Every day | ORAL | 0 refills | Status: DC

## 2021-12-23 MED ORDER — FUROSEMIDE 20 MG PO TABS: 20.0000 mg | ORAL_TABLET | Freq: Every day | ORAL | Status: DC

## 2021-12-23 MED ORDER — POLYETHYLENE GLYCOL 3350 17 G PO PACK: 17.0000 g | PACK | Freq: Every day | ORAL | 0 refills | Status: DC | PRN

## 2021-12-23 NOTE — Care Management Important Message (Signed)
Important Message  Patient Details  Name: Tracy Silva MRN: [id number] Date of Birth: 08-Apr-1940   Medicare Important Message Given:  Yes     Dannette Barbara 12/23/2021, 11:45 AM

## 2021-12-23 NOTE — Progress Notes (Addendum)
Patient was seen evaluated and examined by me and the PA on 12/23/21.  Course of action, evaluation, and management decisions were developed solely by me, but detailed below in the PA's note. [place] NOTE       Patient ID: Tracy Silva MRN: [id number] DOB/AGE: 1940/04/28 81 y.o.  Admit date: 12/16/2021 Referring Physician Donell Beers, NP  Primary Physician Dr. Alba Cory Primary Cardiologist Dr. Saralyn Pilar Reason for Consultation SOB query new CHF vs htn urgency  HPI: Tracy Silva is an 51yoF with a PMH of HTN, DM2, chronic LBBB, hypothyroidism, hx rectal cancer s/p colectomy (2018) who presented to [facility] ED 12/16/2021 in the early morning hours in respiratory distress requiring BiPAP in the emergency department.  Cardiology is consulted for further assistance characterization of the etiology of her shortness of breath. TTE revealed a new HFrEF (25-30% & global hypo) with moderate aortic stenosis (possibly low flow & severe AS since her EF is poor) of uncertain chronicity (no priors for comparison). No obstructive CAD by LHC.   Interval History: -s/p [facility] 11/27. Nonobstructive CAD (worst 60% Lcx). Technically difficult RHC d/t patient discomfort. Will need TAVR eval outpatient. -renal function stable following cath.  -no chest pain, shortness of breath. Says everything is "tolerable." -She is anxious about getting her AV evaluated and wishes this could be done as soon as possible    Review of systems complete and found to be negative unless listed above     Past Medical History:  Diagnosis Date   Acid reflux    Anemia    H/O AS A CHILD   Arthritis    FINGERS AND SHOULDER-RIGHT   Asthma    AS A CHILD   Diabetes mellitus without complication ([place])    Heart murmur    ASYMPTOMATIC   High cholesterol    Hypertension    Hypothyroidism    Other and unspecified noninfectious gastroenteritis and colitis(558.9) 06/27/2012   Rectal bleeding 2011    Rectal cancer ([place]) 07/16/2016    Past Surgical History:  Procedure Laterality Date   COLONOSCOPY  2011   X2   COLONOSCOPY N/A 07/27/2016   Procedure: COLONOSCOPY IN O.R;  Surgeon: Robert Bellow, MD;  Location: [facility] ORS;  Service: General;  Laterality: N/A;   IUD REMOVAL     LAPAROSCOPIC PARTIAL COLECTOMY Left 08/21/2016   Procedure: LAPAROSCOPIC ASSISTED LEFT HEMI-COLECTOMY;  Surgeon: Robert Bellow, MD;  Location: [facility] ORS;  Service: General;  Laterality: Left;   RECTAL EXAM UNDER ANESTHESIA N/A 07/27/2016   Procedure: RECTAL EXAM UNDER ANESTHESIA;  Surgeon: Robert Bellow, MD;  Location: [facility] ORS;  Service: General;  Laterality: N/A;   RIGHT/LEFT HEART CATH AND CORONARY ANGIOGRAPHY N/A 12/22/2021   Procedure: RIGHT/LEFT HEART CATH AND CORONARY ANGIOGRAPHY;  Surgeon: Wellington Hampshire, MD;  Location: [place] CV LAB;  Service: Cardiovascular;  Laterality: N/A;   TONSILLECTOMY     TOOTH EXTRACTION     TRANSANAL EXCISION OF RECTAL MASS  07/27/2016   Procedure: EXCISION OF RECTAL MASS;  Surgeon: Robert Bellow, MD;  Location: [facility] ORS;  Service: General;;   WISDOM TOOTH EXTRACTION      Medications Prior to Admission  Medication Sig Dispense Refill Last Dose   ezetimibe (ZETIA) 10 MG tablet TAKE 1 TABLET BY MOUTH EVERY MORNING 90 tablet 3 Past Week   levothyroxine (SYNTHROID) 75 MCG tablet TAKE ONE TABLET ON AN EMPTY STOMACH WITHA GLASS OF WATER AT LEAST 30 TO 60 MINUTES BEFORE BREAKFAST 30  tablet 9 Past Week   metoprolol succinate (TOPROL-XL) 25 MG 24 hr tablet Take 1 tablet (25 mg total) by mouth daily. 90 tablet 1 Past Week   Multiple Vitamins-Calcium (ONE-A-DAY WOMENS PO) Take by mouth daily.   Past Week   omeprazole (PRILOSEC OTC) 20 MG tablet Take 20 mg by mouth daily as needed.   Past Week   Social History   Socioeconomic History   Marital status: Married    Spouse name: Not on file   Number of children: Not on file   Years of education: Not on file   Highest  education level: Not on file  Occupational History   Not on file  Tobacco Use   Smoking status: Former    Packs/day: 0.50    Years: 4.00    Total pack years: 2.00    Types: Cigarettes    Quit date: 01/26/1971    Years since quitting: 50.9   Smokeless tobacco: Never  Vaping Use   Vaping Use: Never used  Substance and Sexual Activity   Alcohol use: Yes    Comment: occasionally with dinner   Drug use: No   Sexual activity: Not on file  Other Topics Concern   Not on file  Social History Narrative   Not on file   Social Determinants of Health   Financial Resource Strain: Not on file  Food Insecurity: No Food Insecurity (12/17/2021)   Hunger Vital Sign    Worried About Running Out of Food in the Last Year: Never true    [place] in the Last Year: Never true  Transportation Needs: No Transportation Needs (12/17/2021)   PRAPARE - Hydrologist (Medical): No    Lack of Transportation (Non-Medical): No  Physical Activity: Not on file  Stress: Not on file  Social Connections: Not on file  Intimate Partner Violence: Not At Risk (12/17/2021)   Humiliation, Afraid, Rape, and Kick questionnaire    Fear of Current or Ex-Partner: No    Emotionally Abused: No    Physically Abused: No    Sexually Abused: No    Family History  Problem Relation Age of Onset   Pancreatic cancer Mother    Breast cancer Paternal Aunt    Cancer Maternal Aunt      Vitals:   12/22/21 2315 12/23/21 0353 12/23/21 0500 12/23/21 0803  BP: (!) 117/56 132/76  127/70  Pulse: 77 91  85  Resp: '17 16  14  '$ Temp: 97.8 F (36.6 C) 98.4 F (36.9 C)  98.1 F (36.7 C)  TempSrc:  Oral    SpO2: 98% 98%  97%  Weight:   63.7 kg   Height:        PHYSICAL EXAM General: Pleasant elderly Caucasian female, well nourished, in no acute distress.  Sitting upright in PCU bed. No family at bedside. HEENT:  Normocephalic and atraumatic. Neck:  No JVD.  Lungs: Normal respiratory effort  on room air.  Trace bibasilar crackles, overall improving.   Heart: HRRR . Normal S1 and soft S2.  3/6 systolic murmur best heard at the RUSB.  Abdomen: Non-distended appearing.  Msk: Normal strength and tone for age. Extremities: Warm and well perfused. No clubbing, cyanosis.  bilateral LE with chronic venous stasis hyperpigmentation and trace edema. R wrist arteriotomy without apparent bleeding or discomfort Neuro: Alert and oriented X 3. Psych:  Frustrated mood. Answers questions appropriately.   Labs: Basic Metabolic Panel: Recent Labs    12/22/21  2703 12/22/21 1416 12/22/21 1421 12/23/21 0531  NA 141   < > 140 138  K 4.1   < > 4.0 4.1  CL 109  --   --  108  CO2 23  --   --  23  GLUCOSE 102*  --   --  99  BUN 44*  --   --  40*  CREATININE 1.86*  --   --  1.80*  CALCIUM 9.3  --   --  9.1   < > = values in this interval not displayed.    Liver Function Tests: No results for input(s): "AST", "ALT", "ALKPHOS", "BILITOT", "PROT", "ALBUMIN" in the last 72 hours.  No results for input(s): "LIPASE", "AMYLASE" in the last 72 hours. CBC: Recent Labs    12/21/21 0501 12/22/21 1416 12/22/21 1421 12/23/21 0531  WBC 4.9  --   --  4.3  HGB 9.5*   < > 9.5* 9.8*  HCT 28.3*   < > 28.0* 30.1*  MCV 101.1*  --   --  99.0  PLT 197  --   --  203   < > = values in this interval not displayed.    Cardiac Enzymes: No results for input(s): "CKTOTAL", "CKMB", "CKMBINDEX", "TROPONINIHS" in the last 72 hours.  BNP: No results for input(s): "BNP" in the last 72 hours.  D-Dimer: No results for input(s): "DDIMER" in the last 72 hours. Hemoglobin A1C: No results for input(s): "HGBA1C" in the last 72 hours. Fasting Lipid Panel: Recent Labs    12/22/21 0433  CHOL 189  HDL 68  LDLCALC 105*  TRIG 79  CHOLHDL 2.8   Thyroid Function Tests: No results for input(s): "TSH", "T4TOTAL", "T3FREE", "THYROIDAB" in the last 72 hours.  Invalid input(s): "FREET3"  Anemia Panel: Recent Labs     12/22/21 [place] 25      Radiology: CARDIAC CATHETERIZATION  Result Date: 12/22/2021   Prox RCA to Mid RCA lesion is 40% stenosed.   Dist RCA lesion is 30% stenosed.   Prox LAD lesion is 20% stenosed.   Mid Cx lesion is 60% stenosed. 1.  Moderate to severely calcified coronary arteries with mild to moderate nonobstructive coronary artery disease.  Worst stenosis is 60% in the mid left circumflex in a tortuous vessel. 2.  Left ventricular angiography was not performed due to chronic kidney disease.  EF was severely reduced by echo. 3.  Heavily calcified aortic valve with restricted opening.  The valve could not be crossed with a straight wire likely due to the degree of stenosis but also due to difficulty manipulating the catheters from the right arm due to tortuosity and calcifications.  The patient had significant arm discomfort with catheter torquing. 4.  Right heart catheterization showed mildly elevated wedge pressure at 18 mmHg, mild pulmonary hypertension at 38/ 19 mmHg and normal cardiac output. Recommendations: Recommend medical therapy for coronary artery disease.  No critical disease that requires revascularization. Echocardiogram was reviewed and consistent with severe aortic stenosis with mean gradient of 30 and valve area of 0.7.  Recommend outpatient TAVR evaluation.  The plan was discussed with Dr. Nehemiah Massed.   ECHOCARDIOGRAM COMPLETE  Result Date: 12/16/2021    ECHOCARDIOGRAM REPORT   Patient Name:   Tracy Silva Date of Exam: 12/16/2021 Medical Rec #:  [id number]       Height:       63.0 in Accession #:    [id number]      Weight:  155.4 lb Date of Birth:  23-Nov-1940       BSA:          1.737 m Patient Age:    54 years        BP:           135/62 mmHg Patient Gender: F               HR:           88 bpm. Exam Location:  [facility] Procedure: 2D Echo, Color Doppler and Cardiac Doppler Indications:     I42.9 Cardiomyopathy-unspecified  History:         Patient  has no prior history of Echocardiogram examinations.                  Risk Factors:Hypertension, Diabetes and HCL.  Sonographer:     Charmayne Sheer Referring Phys:  [id number] Stoy TANG Diagnosing Phys: Serafina Royals MD IMPRESSIONS  1. Left ventricular ejection fraction, by estimation, is 25 to 30%. The left ventricle has severely decreased function. The left ventricle demonstrates global hypokinesis. The left ventricular internal cavity size was moderately dilated. There is moderate left ventricular hypertrophy. Left ventricular diastolic parameters are consistent with Grade I diastolic dysfunction (impaired relaxation).  2. Right ventricular systolic function is normal. The right ventricular size is normal.  3. Left atrial size was mildly dilated.  4. The mitral valve is normal in structure. Mild to moderate mitral valve regurgitation.  5. Tricuspid valve regurgitation is mild to moderate.  6. The aortic valve is calcified. Aortic valve regurgitation is mild. Moderate aortic valve stenosis. FINDINGS  Left Ventricle: Left ventricular ejection fraction, by estimation, is 25 to 30%. The left ventricle has severely decreased function. The left ventricle demonstrates global hypokinesis. The left ventricular internal cavity size was moderately dilated. There is moderate left ventricular hypertrophy. Left ventricular diastolic parameters are consistent with Grade I diastolic dysfunction (impaired relaxation). Right Ventricle: The right ventricular size is normal. No increase in right ventricular wall thickness. Right ventricular systolic function is normal. Left Atrium: Left atrial size was mildly dilated. Right Atrium: Right atrial size was normal in size. Pericardium: There is no evidence of pericardial effusion. Mitral Valve: The mitral valve is normal in structure. Mild to moderate mitral valve regurgitation. Tricuspid Valve: The tricuspid valve is normal in structure. Tricuspid valve regurgitation is mild to  moderate. Aortic Valve: The aortic valve is calcified. Aortic valve regurgitation is mild. Aortic regurgitation PHT measures 244 msec. Moderate aortic stenosis is present. Aortic valve mean gradient measures 29.0 mmHg. Aortic valve peak gradient measures 46.4 mmHg. Aortic valve area, by VTI measures 0.69 cm. Pulmonic Valve: The pulmonic valve was normal in structure. Pulmonic valve regurgitation is trivial. Aorta: The aortic root and ascending aorta are structurally normal, with no evidence of dilitation. IAS/Shunts: No atrial level shunt detected by color flow Doppler.  LEFT VENTRICLE PLAX 2D LVIDd:         3.70 cm      Diastology LVIDs:         3.50 cm      LV e' medial:    4.13 cm/s LV PW:         0.90 cm      LV E/e' medial:  42.5 LV IVS:        0.90 cm      LV e' lateral:   16.90 cm/s LVOT diam:     1.80 cm      LV  E/e' lateral: 10.4 LV SV:         55 LV SV Index:   31 LVOT Area:     2.54 cm  LV Volumes (MOD) LV vol d, MOD A2C: 96.0 ml LV vol d, MOD A4C: 108.0 ml LV vol s, MOD A2C: 59.3 ml LV vol s, MOD A4C: 79.8 ml LV SV MOD A2C:     36.7 ml LV SV MOD A4C:     108.0 ml LV SV MOD BP:      34.4 ml RIGHT VENTRICLE RV Basal diam:  3.30 cm RV S prime:     10.70 cm/s TAPSE (M-mode): 2.1 cm LEFT ATRIUM             Index        RIGHT ATRIUM           Index LA diam:        3.50 cm 2.01 cm/m   RA Area:     11.30 cm LA Vol (A2C):   41.4 ml 23.83 ml/m  RA Volume:   25.60 ml  14.74 ml/m LA Vol (A4C):   38.8 ml 22.34 ml/m LA Biplane Vol: 40.0 ml 23.03 ml/m  AORTIC VALVE                     PULMONIC VALVE AV Area (Vmax):    0.85 cm      PV Vmax:       2.68 m/s AV Area (Vmean):   0.79 cm      PV Vmean:      207.000 cm/s AV Area (VTI):     0.69 cm      PV VTI:        0.631 m AV Vmax:           340.50 cm/s   PV Peak grad:  28.7 mmHg AV Vmean:          258.000 cm/s  PV Mean grad:  18.0 mmHg AV VTI:            0.791 m AV Peak Grad:      46.4 mmHg AV Mean Grad:      29.0 mmHg LVOT Vmax:         114.00 cm/s LVOT Vmean:         80.000 cm/s LVOT VTI:          0.215 m LVOT/AV VTI ratio: 0.27 AI PHT:            244 msec  AORTA Ao Root diam: 3.10 cm MITRAL VALVE MV Area (PHT): 5.44 cm     SHUNTS MV Decel Time: 139 msec     Systemic VTI:  0.22 m MV E velocity: 175.33 cm/s  Systemic Diam: 1.80 cm Serafina Royals MD Electronically signed by Serafina Royals MD Signature Date/Time: 12/16/2021/4:52:01 PM    Final    DG Chest Portable 1 View  Result Date: 12/16/2021 CLINICAL DATA:  81 year old female with chest pain for 3 days and shortness of breath. Retractions. EXAM: PORTABLE CHEST 1 VIEW COMPARISON:  CT Abdomen and Pelvis 07/22/2016. FINDINGS: Portable AP upright view at 0424 hours. Calcified aortic atherosclerosis. Cardiac size appears to remain normal. Indistinct appearance of the bilateral hila. Relatively large lung volumes with widespread bilateral pulmonary interstitial opacity, coarse in the perihilar regions. No superimposed pneumothorax or consolidation. No pleural effusion identified. Combined disc like and pin-like metallic foreign body projects over the midline in the aortic arch region partially obscuring the trachea.  Etiology and significance is unclear. No acute osseous abnormality identified. Negative visible bowel gas. IMPRESSION: 1. Widespread bilateral pulmonary interstitial opacity with no definite pleural fluid. Consider Bilateral Pneumonia versus Pulmonary Edema. Viral/atypical infectious etiology possible. 2. Combined disc- and pin-like metallic foreign body projects over the midline in the aortic arch region, etiology and significance unclear. 3. Aortic Atherosclerosis (ICD10-I70.0). Electronically Signed   By: Genevie Ann M.D.   On: 12/16/2021 04:52    TELEMETRY reviewed by me (LT) 12/23/2021 : SR 80s  EKG reviewed by me: NSR LBBB rate 96  Data reviewed by me (LT) 12/23/2021:R/LHC report,hospitalist progress note CBC BMP vitals telemetry and troponins I's and O's  Principal Problem:   Acute on chronic  combined systolic and diastolic CHF (congestive heart failure) (HCC) Active Problems:   Hypothyroidism   Acute respiratory failure (HCC)   NSTEMI (non-ST elevated myocardial infarction) ([place])   Acute kidney injury superimposed on CKD (HCC)   Anemia, unspecified   Severe aortic stenosis   Iron deficiency anemia    ASSESSMENT AND PLAN:  Tracy Silva is an 26yoF with a PMH of HTN, DM2, chronic LBBB, hypothyroidism, hx rectal cancer s/p colectomy (2018) who presented to [facility] ED 12/16/2021 in the early morning hours in respiratory distress requiring BiPAP in the emergency department.  Cardiology is consulted for further assistance characterization of the etiology of her shortness of breath. TTE revealed a new HFrEF (25-30% & global hypo) with moderate aortic stenosis (possibly low flow & severe AS since her EF is poor) of uncertain chronicity (no priors for comparison). No obstructive CAD by LHC.   # Acute hypoxic respiratory failure # Hypertensive urgency On 11/8 her PCP discontinued amlodipine-benazepril & HCTZ, and decreased her dose of metoprolol XL from 50 to 25 mg due to orthostatic hypotension.  She presents with a 2-week history of worsening dyspnea on exertion and exertional chest discomfort was hypoxic to the 80s on room air, requiring CPAP & BIPAP on admission. She was extremely hypertensive (176/96) and tachypneic with CXR demonstrating significant pulmonary edema.  She has been weaned from BiPAP to 6 L by HFNC on 11/21.  -remains on room air. BP better controlled.  # severe AS # new HFrEF (25-30%, global hypo) BNP elevated to 1055 with pulmonary edema on CXR and a significant O2 requirement on admission with none at baseline.   -s/p IV lasix '60mg'$  x1 & lasix '40mg'$  x 2 with excellent diuresis but bump in Cr.  -start lasix '20mg'$  PO once daily tomorrow  -continue metoprolol tartrate to '25mg'$  once daily  -continue Imdur 15 mg and hydralazine 10 mg twice daily for afterload  reduction -consider further excalation in GDMT (ACEi/ARB, MRA, Sglt2i) on an outpatient basis pending improvement in her renal function - discussed at length with the patient that further TAVR eval will be performed on an outpatient basis after follow up with Dr. Saralyn Pilar in 1 week.   # NSTEMI # nonobstructive CAD  # chronic LBBB Troponins elevated and uptrending from 19, 104, 1681, 2256, 2959, 1877 with exertional angina in several days prior to admission. EKG redemonstrates her chronic LBBB without other acute ischemic changes. Trop elevation concerning for NSTEMI with significant delta and recurrence in chest discomfort on 11/26, resolving w/ SL NTG x2. -s/p '325mg'$  ASA, continue aspirin '81mg'$  & clopidogrel '75mg'$  daily for at least 6 months. -s/p heparin infusion for 48 hours, ended 11/23 at 1400  -continue metoprolol -continue zetia '10mg'$ , intolerant of "statins." She is unsure of which ones  she has tried. She does not want to start one today d/t pill burden, prefers diet control only (LDL 105). -s/p R/LHC on 11/27. Cardiac rehab following TAVR eval.  # AKI on CKD 3  Hold further diuresis. S/p 545m bolus of fluids 11/26 with improvement in renal function. Fortunately stable after cath   # hypothyroidism  TSH elevated at 18, T4 wnl.  -on synthroid 726m daily, mgmt per primary   [facility]. She has a follow up appointment with Dr. PaSaralyn Pilarn 12/7 at 11am.   This patient's plan of care was discussed and created with Dr. CaClayborn Bignessnd he is in agreement.  Signed: LiTristan Schroeder PA-C 12/23/2021, 9:15 AM [facility]

## 2021-12-23 NOTE — Discharge Instructions (Signed)
Your omeprazole was switched to protonix since it interacts with plavix

## 2021-12-23 NOTE — TOC Progression Note (Signed)
Transition of Care [facility]) - Progression Note    Patient Details  Name: Tracy Silva MRN: [id number] Date of Birth: 11/25/1940  Transition of Care [facility]) CM/SW [place], LCSW Phone Number: 12/23/2021, 12:06 PM  Clinical Narrative:     CSW reached out to pt and dtr, Tracy Silva to confirm that they are declining [place] and only wanted a RW.  Information confirmed.  No other needs at this tine.  Expected Discharge Plan: [place] Barriers to Discharge: Continued Medical Work up  Expected Discharge Plan and Services Expected Discharge Plan: [place]   Discharge Planning Services: CM Consult Post Acute Care Choice: [place] arrangements for the past 2 months: Single Family Home Expected Discharge Date: 12/23/21                                     Social Determinants of Health (SDOH) Interventions    Readmission Risk Interventions     No data to display

## 2021-12-23 NOTE — Assessment & Plan Note (Signed)
Omeprazole changed over to Protonix since this interacts with Plavix for

## 2021-12-23 NOTE — Discharge Summary (Signed)
Physician Discharge Summary   Patient: Tracy Silva MRN: [id number] DOB: November 03, 1940  Admit date:     12/16/2021  Discharge date: 12/23/21  Discharge Physician: Loletha Grayer   PCP: Eulis Foster, MD   Recommendations at discharge:   Follow-up cardiology Dr. Saralyn Pilar 1 week Follow-up PCP 5 days Follow-up CHF clinic and cardiac rehab Will need referral to cardiothoracic surgery as outpatient for consideration of TAVR  Discharge Diagnoses: Principal Problem:   Acute on chronic combined systolic and diastolic CHF (congestive heart failure) ([place]) Active Problems:   NSTEMI (non-ST elevated myocardial infarction) ([place])   Severe aortic stenosis   Acute kidney injury superimposed on CKD ([place])   Acute respiratory failure (HCC)   GERD (gastroesophageal reflux disease)   Hypothyroidism   Iron deficiency anemia    Hospital Course: 81 yo F presenting to [facility] ED from home via EMS with complaints of CP and dyspnea over the last 2-3 days. She reports intermittent CP with some dyspnea that she thought might be asthma related, this CP was experienced both at rest and with activity. She describes chest pain as 8/10 mid-sternal anterior pressure that is not reproducible. She denies abdominal pain/ nausea (*however was given Zofran x 2 in ED)/ vomiting/ diarrhea, she denies urinary symptoms, fever/chills (but is currently shivering requiring 7 blankets). She woke up suddenly with dyspnea in the middle of the night prompting her to call EMS. Upon EMS arrival SpO2 in the 80's on RA, placed on CPAP for transport.   ED course: Upon arrival patient is tachycardic, tachypneic, hypertensive & in respiratory distress. Patient placed on BIPAP support. Patient with diffuse rales on exam. Treated for hypertensive urgency, flash pulmonary edema, acute CHF exacerbation. Medications given: fentanyl, Lasix, hydralazine, ativan, nitro paste, zofran Initial Vitals: 97.5, 30, 120, 155/75 & 100% on  BIPAP 70% Significant labs: (Labs/ Imaging personally reviewed) I, Domingo Pulse [place], AGACNP-BC, personally viewed and interpreted this ECG. EKG Interpretation: Date: 12/16/21, EKG Time: 04:10, Rate: 146, Rhythm: ST, QRS Axis:  LAD, Intervals: LBBB, ST/T Wave abnormalities: none, Narrative Interpretation: ST with LBBB (baseline LBBB) Chemistry: Na+: 142, K+: 4.0, BUN/Cr.: 33/1.66, Serum CO2/ AG: 22/9 Hematology: WBC:  7.6, Hgb: 9.7,  Troponin: 19, BNP: 1055, PCT: <0.10, COVID-19 & Influenza A/B: negative   ABG: 7.25/ 50/ 35/ 21.9 CXR 12/16/21: Widespread bilateral pulmonary interstitial opacity with no definite pleural fluid. Consider Bilateral Pneumonia versus Pulmonary Edema. Viral/atypical infectious etiology possible. Combined disc- and pin-like metallic foreign body projects over the midline in the aortic arch region, etiology and significance unclear.   PCCM consulted for admission due to acute hypoxic respiratory failure requiring BIPAP with high risk for intubation.   As per Dr. Jimmye Norman 11/22-11/25/23: Pt presented w/ shortness of breath and was found to have CHF exacerbation & NSTEMI. Pt was initially treated w/ IV lasix for CHF exacerbation but it has been held a couple of days for increasing Cr. Of note, pt was treated w/ medical management for NSTEMI but pt will go for cardiac cath on 12/22/21 as per cardio.  Cardiac catheterization on 12/22/2021 showed nonobstructive coronary artery disease with severe aortic stenosis.  Cardiology recommended medical management and referral to cardiothoracic surgery as outpatient for consideration of TAVR procedure.  Assessment and Plan: * Acute on chronic combined systolic and diastolic CHF (congestive heart failure) (HCC) EF 25 to 30% with moderate aortic stenosis.  Cardiac catheterization confirmed severe aortic stenosis.  Continue metoprolol.  Can go back on low-dose Lasix.  Afterload reduction contraindicated with severe  aortic stenosis  (so no ACE inhibitor or ARB).  With creatinine fluctuations I would not start spironolactone or Farxiga at this time either.  Follow-up at CHF clinic.  NSTEMI (non-ST elevated myocardial infarction) (HCC) Continue aspirin, Plavix and metoprolol.  Allergy to statin on Zetia.  Cardiac cath showing severe aortic stenosis and coronary arteries due to Blockages but Not Enough for a Stent.  Refer to cardiac rehab.  Severe aortic stenosis Cardiology recommending outpatient management for possible TAVR procedure.  Acute kidney injury superimposed on CKD ([place]) Acute kidney injury on CKD stage IIIb.  Creatinine on presentation 1.66.  Creatinine went as high as 2.51 with diuresis.  Upon discharge creatinine 1.80.  Recommend checking a BMP and follow-up appointment.  Acute respiratory failure (HCC) This has improved.  The patient had respiratory distress on presentation requiring BiPAP.  Iron deficiency anemia Last hemoglobin 9.8.  Ferritin 25.  Iron started.  Watch blood counts closely as outpatient being that she is on blood thinners.  Hypothyroidism Continue Synthroid  GERD (gastroesophageal reflux disease) Omeprazole changed over to Protonix since this interacts with Plavix for         Consultants: Cardiology Procedures performed: Cardiac catheterization Disposition: Home Diet recommendation:  Cardiac diet DISCHARGE MEDICATION: Allergies as of 12/23/2021       Reactions   Codeine Other (See Comments)   Headaches Heart palpations   Other    Wool and cashmere    Statins    Muscle pain, joint pain   Adhesive [tape] Rash   FOR  LONG PERIODS OF TIME FOR  LONG PERIODS OF TIME        Medication List     STOP taking these medications    metoprolol succinate 25 MG 24 hr tablet Commonly known as: TOPROL-XL   omeprazole 20 MG tablet Commonly known as: PRILOSEC OTC       TAKE these medications    aspirin 81 MG chewable tablet Chew 1 tablet (81 mg total) by mouth  daily. Start taking on: December 24, 2021   clopidogrel 75 MG tablet Commonly known as: PLAVIX Take 1 tablet (75 mg total) by mouth daily. Start taking on: December 24, 2021   ezetimibe 10 MG tablet Commonly known as: ZETIA TAKE 1 TABLET BY MOUTH EVERY MORNING   ferrous sulfate 325 (65 FE) MG tablet Take 1 tablet (325 mg total) by mouth daily with breakfast. Start taking on: December 24, 2021   furosemide 20 MG tablet Commonly known as: LASIX Take 1 tablet (20 mg total) by mouth daily. Start taking on: December 24, 2021   isosorbide mononitrate 30 MG 24 hr tablet Commonly known as: IMDUR Take 0.5 tablets (15 mg total) by mouth daily. Start taking on: December 24, 2021   levothyroxine 75 MCG tablet Commonly known as: SYNTHROID TAKE ONE TABLET ON AN EMPTY STOMACH WITHA GLASS OF WATER AT LEAST 30 TO 60 MINUTES BEFORE BREAKFAST   metoprolol tartrate 25 MG tablet Commonly known as: LOPRESSOR Take 1 tablet (25 mg total) by mouth 2 (two) times daily.   ONE-A-DAY WOMENS PO Take by mouth daily.   pantoprazole 40 MG tablet Commonly known as: PROTONIX Take 1 tablet (40 mg total) by mouth daily. Start taking on: December 24, 2021   polyethylene glycol 17 g packet Commonly known as: MIRALAX / GLYCOLAX Take 17 g by mouth daily as needed for moderate constipation.               Durable Medical Equipment  (From admission,  onward)           Start     Ordered   12/23/21 1113  For home use only DME Walker rolling  Once       Question Answer Comment  Walker: With [place]   Patient needs a walker to Silva with the following condition Unsteady gait when walking      12/23/21 1112   12/19/21 1024  For home use only DME Walker rolling  Once       Question Answer Comment  Walker: With [place]   Patient needs a walker to Silva with the following condition Generalized weakness      12/19/21 1023            Follow-up Information     Paraschos,  Alexander, MD. Go in 1 week(s).   Specialty: Cardiology Why: Appointment on Thursday, 01/01/2022 at 11:00am. Contact information: [facility] [place] [address] [phone]         Eulis Foster, MD. Schedule an appointment as soon as possible for a visit in 5 day(s).   Specialty: Family Medicine Why: Please call and schedule this appointment with Dr. Alba Cory. Contact information: [address] [place] [address] [phone]                Discharge Exam: Danley Danker Weights   12/18/21 0500 12/21/21 0500 12/23/21 0500  Weight: 65.9 kg 63.3 kg 63.7 kg   Physical Exam HENT:     Head: Normocephalic.     Mouth/Throat:     Pharynx: No oropharyngeal exudate.  Eyes:     General: Lids are normal.     Conjunctiva/sclera: Conjunctivae normal.  Cardiovascular:     Rate and Rhythm: Normal rate and regular rhythm.     Heart sounds: S1 normal. Heart sounds are distant. Murmur heard.     Systolic murmur is present with a grade of 4/6.  Pulmonary:     Breath sounds: Examination of the right-lower field reveals decreased breath sounds. Examination of the left-lower field reveals decreased breath sounds. Decreased breath sounds present. No wheezing, rhonchi or rales.  Abdominal:     Palpations: Abdomen is soft.     Tenderness: There is no abdominal tenderness.  Musculoskeletal:     Right lower leg: Swelling present.     Left lower leg: Swelling present.  Skin:    General: Skin is warm.     Findings: No rash.  Neurological:     Mental Status: She is alert and oriented to person, place, and time.      Condition at discharge: stable  The results of significant diagnostics from this hospitalization (including imaging, microbiology, ancillary and laboratory) are listed below for reference.   Imaging Studies: CARDIAC CATHETERIZATION  Result Date: 12/22/2021   Prox RCA to Mid RCA lesion is 40%  stenosed.   Dist RCA lesion is 30% stenosed.   Prox LAD lesion is 20% stenosed.   Mid Cx lesion is 60% stenosed. 1.  Moderate to severely calcified coronary arteries with mild to moderate nonobstructive coronary artery disease.  Worst stenosis is 60% in the mid left circumflex in a tortuous vessel. 2.  Left ventricular angiography was not performed due to chronic kidney disease.  EF was severely reduced by echo. 3.  Heavily calcified aortic valve with restricted opening.  The valve could not be crossed with a straight wire likely due to the degree of stenosis but also due to  difficulty manipulating the catheters from the right arm due to tortuosity and calcifications.  The patient had significant arm discomfort with catheter torquing. 4.  Right heart catheterization showed mildly elevated wedge pressure at 18 mmHg, mild pulmonary hypertension at 38/ 19 mmHg and normal cardiac output. Recommendations: Recommend medical therapy for coronary artery disease.  No critical disease that requires revascularization. Echocardiogram was reviewed and consistent with severe aortic stenosis with mean gradient of 30 and valve area of 0.7.  Recommend outpatient TAVR evaluation.  The plan was discussed with Dr. Nehemiah Massed.   ECHOCARDIOGRAM COMPLETE  Result Date: 12/16/2021    ECHOCARDIOGRAM REPORT   Patient Name:   Tracy Silva Date of Exam: 12/16/2021 Medical Rec #:  [id number]       Height:       63.0 in Accession #:    [id number]      Weight:       155.4 lb Date of Birth:  [birth year]       BSA:          1.737 m Patient Age:    3 years        BP:           135/62 mmHg Patient Gender: F               HR:           88 bpm. Exam Location:  [facility] Procedure: 2D Echo, Color Doppler and Cardiac Doppler Indications:     I42.9 Cardiomyopathy-unspecified  History:         Patient has no prior history of Echocardiogram examinations.                  Risk Factors:Hypertension, Diabetes and HCL.  Sonographer:     Charmayne Sheer Referring  Phys:  [id number] [place] TANG Diagnosing Phys: Serafina Royals MD IMPRESSIONS  1. Left ventricular ejection fraction, by estimation, is 25 to 30%. The left ventricle has severely decreased function. The left ventricle demonstrates global hypokinesis. The left ventricular internal cavity size was moderately dilated. There is moderate left ventricular hypertrophy. Left ventricular diastolic parameters are consistent with Grade I diastolic dysfunction (impaired relaxation).  2. Right ventricular systolic function is normal. The right ventricular size is normal.  3. Left atrial size was mildly dilated.  4. The mitral valve is normal in structure. Mild to moderate mitral valve regurgitation.  5. Tricuspid valve regurgitation is mild to moderate.  6. The aortic valve is calcified. Aortic valve regurgitation is mild. Moderate aortic valve stenosis. FINDINGS  Left Ventricle: Left ventricular ejection fraction, by estimation, is 25 to 30%. The left ventricle has severely decreased function. The left ventricle demonstrates global hypokinesis. The left ventricular internal cavity size was moderately dilated. There is moderate left ventricular hypertrophy. Left ventricular diastolic parameters are consistent with Grade I diastolic dysfunction (impaired relaxation). Right Ventricle: The right ventricular size is normal. No increase in right ventricular wall thickness. Right ventricular systolic function is normal. Left Atrium: Left atrial size was mildly dilated. Right Atrium: Right atrial size was normal in size. Pericardium: There is no evidence of pericardial effusion. Mitral Valve: The mitral valve is normal in structure. Mild to moderate mitral valve regurgitation. Tricuspid Valve: The tricuspid valve is normal in structure. Tricuspid valve regurgitation is mild to moderate. Aortic Valve: The aortic valve is calcified. Aortic valve regurgitation is mild. Aortic regurgitation PHT measures 244 msec. Moderate aortic  stenosis is present. Aortic valve mean gradient measures 29.0 mmHg. Aortic  valve peak gradient measures 46.4 mmHg. Aortic valve area, by VTI measures 0.69 cm. Pulmonic Valve: The pulmonic valve was normal in structure. Pulmonic valve regurgitation is trivial. Aorta: The aortic root and ascending aorta are structurally normal, with no evidence of dilitation. IAS/Shunts: No atrial level shunt detected by color flow Doppler.  LEFT VENTRICLE PLAX 2D LVIDd:         3.70 cm      Diastology LVIDs:         3.50 cm      LV e' medial:    4.13 cm/s LV PW:         0.90 cm      LV E/e' medial:  42.5 LV IVS:        0.90 cm      LV e' lateral:   16.90 cm/s LVOT diam:     1.80 cm      LV E/e' lateral: 10.4 LV SV:         55 LV SV Index:   31 LVOT Area:     2.54 cm  LV Volumes (MOD) LV vol d, MOD A2C: 96.0 ml LV vol d, MOD A4C: 108.0 ml LV vol s, MOD A2C: 59.3 ml LV vol s, MOD A4C: 79.8 ml LV SV MOD A2C:     36.7 ml LV SV MOD A4C:     108.0 ml LV SV MOD BP:      34.4 ml RIGHT VENTRICLE RV Basal diam:  3.30 cm RV S prime:     10.70 cm/s TAPSE (M-mode): 2.1 cm LEFT ATRIUM             Index        RIGHT ATRIUM           Index LA diam:        3.50 cm 2.01 cm/m   RA Area:     11.30 cm LA Vol (A2C):   41.4 ml 23.83 ml/m  RA Volume:   25.60 ml  14.74 ml/m LA Vol (A4C):   38.8 ml 22.34 ml/m LA Biplane Vol: 40.0 ml 23.03 ml/m  AORTIC VALVE                     PULMONIC VALVE AV Area (Vmax):    0.85 cm      PV Vmax:       2.68 m/s AV Area (Vmean):   0.79 cm      PV Vmean:      207.000 cm/s AV Area (VTI):     0.69 cm      PV VTI:        0.631 m AV Vmax:           340.50 cm/s   PV Peak grad:  28.7 mmHg AV Vmean:          258.000 cm/s  PV Mean grad:  18.0 mmHg AV VTI:            0.791 m AV Peak Grad:      46.4 mmHg AV Mean Grad:      29.0 mmHg LVOT Vmax:         114.00 cm/s LVOT Vmean:        80.000 cm/s LVOT VTI:          0.215 m LVOT/AV VTI ratio: 0.27 AI PHT:            244 msec  AORTA Ao Root diam: 3.10 cm MITRAL VALVE MV Area  (  PHT): 5.44 cm     SHUNTS MV Decel Time: 139 msec     Systemic VTI:  0.22 m MV E velocity: 175.33 cm/s  Systemic Diam: 1.80 cm Serafina Royals MD Electronically signed by Serafina Royals MD Signature Date/Time: 12/16/2021/4:52:01 PM    Final    DG Chest Portable 1 View  Result Date: 12/16/2021 CLINICAL DATA:  80 year old female with chest pain for 3 days and shortness of breath. Retractions. EXAM: PORTABLE CHEST 1 VIEW COMPARISON:  CT Abdomen and Pelvis 07/22/2016. FINDINGS: Portable AP upright view at 0424 hours. Calcified aortic atherosclerosis. Cardiac size appears to remain normal. Indistinct appearance of the bilateral hila. Relatively large lung volumes with widespread bilateral pulmonary interstitial opacity, coarse in the perihilar regions. No superimposed pneumothorax or consolidation. No pleural effusion identified. Combined disc like and pin-like metallic foreign body projects over the midline in the aortic arch region partially obscuring the trachea. Etiology and significance is unclear. No acute osseous abnormality identified. Negative visible bowel gas. IMPRESSION: 1. Widespread bilateral pulmonary interstitial opacity with no definite pleural fluid. Consider Bilateral Pneumonia versus Pulmonary Edema. Viral/atypical infectious etiology possible. 2. Combined disc- and pin-like metallic foreign body projects over the midline in the aortic arch region, etiology and significance unclear. 3. Aortic Atherosclerosis (ICD10-I70.0). Electronically Signed   By: Genevie Ann M.D.   On: 12/16/2021 04:52    Microbiology: Results for orders placed or performed during the hospital encounter of 12/16/21  Resp Panel by RT-PCR (Flu A&B, Covid) Anterior Nasal Swab     Status: None   Collection Time: 12/16/21  4:19 AM   Specimen: Anterior Nasal Swab  Result Value Ref Range Status   SARS Coronavirus 2 by RT PCR NEGATIVE NEGATIVE Final    Comment: (NOTE) SARS-CoV-2 target nucleic acids are NOT DETECTED.  The  SARS-CoV-2 RNA is generally detectable in upper respiratory specimens during the acute phase of infection. The lowest concentration of SARS-CoV-2 viral copies this assay can detect is 138 copies/mL. A negative result does not preclude SARS-Cov-2 infection and should not be used as the sole basis for treatment or other patient management decisions. A negative result may occur with  improper specimen collection/handling, submission of specimen other than nasopharyngeal swab, presence of viral mutation(s) within the areas targeted by this assay, and inadequate number of viral copies(<138 copies/mL). A negative result must be combined with clinical observations, patient history, and epidemiological information. The expected result is Negative.  Fact Sheet for Patients:  EntrepreneurPulse.com.au  Fact Sheet for Healthcare Providers:  IncredibleEmployment.be  This test is no t yet approved or cleared by the [place] FDA and  has been authorized for detection and/or diagnosis of SARS-CoV-2 by FDA under an Emergency Use Authorization (EUA). This EUA will remain  in effect (meaning this test can be used) for the duration of the COVID-19 declaration under Section 564(b)(1) of the Act, 21 U.S.C.section 360bbb-3(b)(1), unless the authorization is terminated  or revoked sooner.       Influenza A by PCR NEGATIVE NEGATIVE Final   Influenza B by PCR NEGATIVE NEGATIVE Final    Comment: (NOTE) The Xpert Xpress SARS-CoV-2/FLU/RSV plus assay is intended as an aid in the diagnosis of influenza from Nasopharyngeal swab specimens and should not be used as a sole basis for treatment. Nasal washings and aspirates are unacceptable for Xpert Xpress SARS-CoV-2/FLU/RSV testing.  Fact Sheet for Patients: EntrepreneurPulse.com.au  Fact Sheet for Healthcare Providers: IncredibleEmployment.be  This test is not yet approved or  cleared by the [place]  States FDA and has been authorized for detection and/or diagnosis of SARS-CoV-2 by FDA under an Emergency Use Authorization (EUA). This EUA will remain in effect (meaning this test can be used) for the duration of the COVID-19 declaration under Section 564(b)(1) of the Act, 21 U.S.C. section 360bbb-3(b)(1), unless the authorization is terminated or revoked.  Performed at [facility], [address] [place], [place] [address]   MRSA Next Gen by PCR, Nasal     Status: None   Collection Time: 12/16/21  6:56 AM   Specimen: Nasal Mucosa; Nasal Swab  Result Value Ref Range Status   MRSA by PCR Next Gen NOT DETECTED NOT DETECTED Final    Comment: (NOTE) The GeneXpert MRSA Assay (FDA approved for NASAL specimens only), is one component of a comprehensive MRSA colonization surveillance program. It is not intended to diagnose MRSA infection nor to guide or monitor treatment for MRSA infections. Test performance is not FDA approved in patients less than 61 years old. Performed at [facility], [address] [place], [place] [address]     Labs: CBC: Recent Labs  Lab 12/18/21 0448 12/19/21 0428 12/20/21 0440 12/21/21 0501 12/22/21 1416 12/22/21 1421 12/23/21 0531  WBC 5.2 4.8 4.0 4.9  --   --  4.3  HGB 9.7* 9.1* 9.1* 9.5* 9.9* 9.5* 9.8*  HCT 29.6* 27.7* 27.9* 28.3* 29.0* 28.0* 30.1*  MCV 100.0 99.6 100.4* 101.1*  --   --  99.0  PLT 200 198 197 197  --   --  580   Basic Metabolic Panel: Recent Labs  Lab 12/17/21 0617 12/18/21 0448 12/19/21 0428 12/20/21 0440 12/21/21 0501 12/22/21 0434 12/22/21 1416 12/22/21 1421 12/23/21 0531  NA 137 140 140 135 141 141 140 140 138  K 3.7 3.9 3.7 3.6 3.9 4.1 4.1 4.0 4.1  CL 103 105 105 104 107 109  --   --  108  CO2 '26 24 25 25 24 23  '$ --   --  23  GLUCOSE 97 115* 104* 96 107* 102*  --   --  99  BUN 41* 50* 48* 46* 47* 44*  --   --  40*  CREATININE 2.15* 2.51* 1.93* 1.89* 2.08* 1.86*  --    --  1.80*  CALCIUM 9.0 9.3 9.4 9.1 9.3 9.3  --   --  9.1  MG 2.0 2.1  --   --   --   --   --   --   --   PHOS 4.5 4.0  --   --   --   --   --   --   --     CBG: Recent Labs  Lab 12/21/21 0518 12/21/21 0840 12/21/21 1233 12/21/21 1636 12/22/21 2217  GLUCAP 91 99 107* 122* 110*    Discharge time spent: greater than 30 minutes.  Signed: Loletha Grayer, MD [facility] 12/23/2021

## 2021-12-24 ENCOUNTER — Telehealth: Payer: Self-pay | Admitting: *Deleted

## 2021-12-24 LAB — LIPOPROTEIN A (LPA): Lipoprotein (a): 56.2 nmol/L — ABNORMAL HIGH (ref <75.0)

## 2021-12-24 NOTE — Patient Outreach (Signed)
  Care Coordination [facility] Note Transition Care Management Unsuccessful Follow-up Telephone Call  Date of discharge and from where:  [facility] [phone]  Attempts:  1st Attempt  Reason for unsuccessful TCM follow-up call:  Unable to leave message   [place] Management [phone]

## 2021-12-24 NOTE — Patient Outreach (Signed)
  Care Coordination [facility] Note Transition Care Management Unsuccessful Follow-up Telephone Call  Date of discharge and from where:  [facility] [phone]  Attempts:  2nd Attempt  Reason for unsuccessful TCM follow-up call:  Unable to leave message [place] Management [phone]

## 2021-12-26 ENCOUNTER — Ambulatory Visit (INDEPENDENT_AMBULATORY_CARE_PROVIDER_SITE_OTHER): Payer: Medicare Other | Admitting: Family Medicine

## 2021-12-26 ENCOUNTER — Telehealth: Payer: Self-pay

## 2021-12-26 ENCOUNTER — Encounter: Payer: Self-pay | Admitting: Family Medicine

## 2021-12-26 VITALS — BP 110/62 | HR 85 | Resp 16 | Ht 63.0 in | Wt 139.0 lb

## 2021-12-26 DIAGNOSIS — I5020 Unspecified systolic (congestive) heart failure: Secondary | ICD-10-CM

## 2021-12-26 DIAGNOSIS — I350 Nonrheumatic aortic (valve) stenosis: Secondary | ICD-10-CM

## 2021-12-26 DIAGNOSIS — D518 Other vitamin B12 deficiency anemias: Secondary | ICD-10-CM | POA: Diagnosis not present

## 2021-12-26 NOTE — Progress Notes (Addendum)
I,Tiffany J Bragg,acting as a scribe for Lelon Huh, MD.,have documented all relevant documentation on the behalf of Lelon Huh, MD,as directed by  Lelon Huh, MD while in the presence of Lelon Huh, MD.   Established patient visit   Patient: Tracy Silva   DOB: [birth year]   81 y.o. Female  MRN: [id number] Visit Date: 12/26/2021  Today's healthcare provider: Lelon Huh, MD   Chief Complaint  Patient presents with   Follow-up   Subjective    HPI  Follow up Hospitalization  Patient was admitted to [facility] on 12/16/2021 for shortness of breath and chest pains and discharged on 12/23/2021. She was treated for; Acute on chronic combined systolic and diastolic CHF and NSTEMI . Pt was initially treated w/ IV lasix for CHF exacerbation. Was treated w/ medical management for NSTEMI.   Cardiac catheterization on 12/22/2021 showed nonobstructive coronary artery disease with severe aortic stenosis.  Echo with ER 25-30% and moderate aortic stenosis. Cardiology recommended medical management and referral to cardiothoracic surgery as outpatient for consideration of TAVR procedure. She was discharged on new prescription of furosemide.    She reports excellent compliance with treatment. She reports this condition is improved. Dyspnea and chest pain have resolved.   She has follow up with Dr. Saralyn Pilar schedule on December 7th.  -----------------------------------------------------------------------------------------    Medications: Outpatient Medications Prior to Visit  Medication Sig   aspirin 81 MG chewable tablet Chew 1 tablet (81 mg total) by mouth daily.   clopidogrel (PLAVIX) 75 MG tablet Take 1 tablet (75 mg total) by mouth daily.   ezetimibe (ZETIA) 10 MG tablet TAKE 1 TABLET BY MOUTH EVERY MORNING   ferrous sulfate 325 (65 FE) MG tablet Take 1 tablet (325 mg total) by mouth daily with breakfast.   furosemide (LASIX) 20 MG tablet Take 1 tablet (20 mg total) by  mouth daily.   isosorbide mononitrate (IMDUR) 30 MG 24 hr tablet Take 0.5 tablets (15 mg total) by mouth daily.   levothyroxine (SYNTHROID) 75 MCG tablet TAKE ONE TABLET ON AN EMPTY STOMACH WITHA GLASS OF WATER AT LEAST 30 TO 60 MINUTES BEFORE BREAKFAST   metoprolol tartrate (LOPRESSOR) 25 MG tablet Take 1 tablet (25 mg total) by mouth 2 (two) times daily.   Multiple Vitamins-Calcium (ONE-A-DAY WOMENS PO) Take by mouth daily.   pantoprazole (PROTONIX) 40 MG tablet Take 1 tablet (40 mg total) by mouth daily.   polyethylene glycol (MIRALAX / GLYCOLAX) 17 g packet Take 17 g by mouth daily as needed for moderate constipation.   No facility-administered medications prior to visit.    Review of Systems  Constitutional:  Negative for appetite change, chills, fatigue and fever.  Respiratory:  Negative for chest tightness and shortness of breath.   Cardiovascular:  Negative for chest pain and palpitations.  Gastrointestinal:  Negative for abdominal pain, nausea and vomiting.  Neurological:  Negative for dizziness and weakness.       Objective    BP 110/62 (BP Location: Right Arm, Patient Position: Sitting, Cuff Size: Normal)   Pulse 85   Resp 16   Ht '5\' 3"'$  (1.6 m)   Wt 139 lb (63 kg)   SpO2 98%   BMI 24.62 kg/m    Physical Exam    General: Appearance:    Well developed, well nourished female in no acute distress  Eyes:    PERRL, conjunctiva/corneas clear, EOM's intact       Lungs:     Clear to auscultation bilaterally,  respirations unlabored  Heart:    Normal heart rate. Normal rhythm.  3/6 harsh, crescendo-decrescendo, systolic murmur at right upper sternal border 1/6 high pitched, blowing, decrescendo, diastolic murmur at the left third intercostal space and lower sternal border   MS:   All extremities are intact.  2+ bipedal and ankle edema with moderate varicosities.   Neurologic:   Awake, alert, oriented x 3. No apparent focal neurological defect.         Assessment & Plan      1. HFrEF (heart failure with reduced ejection fraction) (HCC) Much better since diuresis at recent hospitalization and tolerating initiation of diuretic.   - Renal function panel - CBC  Follow up with Dr. Saralyn Pilar 12-7 as scheduled.   2. Nonrheumatic aortic valve stenosis Follow up cardiology as above for consideration of TAVR  3. Other vitamin B12 deficiency anemia Borderline low B12 on recent anemia work up.   - CBC      The entirety of the information documented in the History of Present Illness, Review of Systems and Physical Exam were personally obtained by me. Portions of this information were initially documented by the CMA and reviewed by me for thoroughness and accuracy.     Lelon Huh, MD  [facility] [phone] (phone) [phone] (fax)  [place]

## 2021-12-26 NOTE — Patient Instructions (Signed)
Please review the attached list of medications and notify my office if there are any errors.   Please go to the lab draw station in [address] on the second floor of [facility]. Normal hours are 8:00am to 11:30am and 1:00pm to 4:00pm Monday through Friday  

## 2021-12-26 NOTE — Patient Outreach (Signed)
  Care Coordination TOC Note Transition Care Management Unsuccessful Follow-up Telephone Call  Date of discharge and from where:  12/23/21-[facility]  Attempts:  3rd Attempt  Reason for unsuccessful TCM follow-up call:  Unable to reach patient    Enzo Montgomery, RN,BSN,CCM [place] Management Telephonic Care Management Coordinator Direct Phone: [phone] Toll Free: [phone] Fax: [phone]

## 2021-12-31 LAB — RENAL FUNCTION PANEL
Albumin: 4.4 g/dL (ref 3.7–4.7)
BUN/Creatinine Ratio: 18 (ref 12–28)
BUN: 39 mg/dL — ABNORMAL HIGH (ref 8–27)
CO2: 21 mmol/L (ref 20–29)
Calcium: 9.6 mg/dL (ref 8.7–10.3)
Chloride: 102 mmol/L (ref 96–106)
Creatinine, Ser: 2.21 mg/dL — ABNORMAL HIGH (ref 0.57–1.00)
Glucose: 107 mg/dL — ABNORMAL HIGH (ref 70–99)
Phosphorus: 3.9 mg/dL (ref 3.0–4.3)
Potassium: 4.2 mmol/L (ref 3.5–5.2)
Sodium: 138 mmol/L (ref 134–144)
eGFR: 22 mL/min/{1.73_m2} — ABNORMAL LOW (ref >59)

## 2021-12-31 LAB — CBC
Hematocrit: 28.4 % — ABNORMAL LOW (ref 34.0–46.6)
Hemoglobin: 9.2 g/dL — ABNORMAL LOW (ref 11.1–15.9)
MCH: 32.7 pg (ref 26.6–33.0)
MCHC: 32.4 g/dL (ref 31.5–35.7)
MCV: 101 fL — ABNORMAL HIGH (ref 79–97)
Platelets: 230 10*3/uL (ref 150–450)
RBC: 2.81 x10E6/uL — ABNORMAL LOW (ref 3.77–5.28)
RDW: 13.2 % (ref 11.7–15.4)
WBC: 5.7 10*3/uL (ref 3.4–10.8)

## 2022-01-01 ENCOUNTER — Encounter: Payer: Medicare Other | Admitting: Family

## 2022-01-02 NOTE — Progress Notes (Unsigned)
   Patient ID: Tracy Silva, female    DOB: [birth year], 81 y.o.   MRN: [id number]  HPI  Tracy Silva is a 81 y/o female with a history of  Echo report from 12/16/21 showed an EF of 25-30% along with moderate LVH, mild LAE, mild AR and mild/moderate MR/TR. The aortic valve is calcified. Aortic valve regurgitation is mild. Moderate aortic valve stenosis.   RHC/LHC done 12/22/21 showed:   Prox RCA to Mid RCA lesion is 40% stenosed.   Dist RCA lesion is 30% stenosed.   Prox LAD lesion is 20% stenosed.   Mid Cx lesion is 60% stenosed.  1.  Moderate to severely calcified coronary arteries with mild to moderate nonobstructive coronary artery disease.  Worst stenosis is 60% in the mid left circumflex in a tortuous vessel. 2.  Left ventricular angiography was not performed due to chronic kidney disease.  EF was severely reduced by echo. 3.  Heavily calcified aortic valve with restricted opening.  The valve could not be crossed with a straight wire likely due to the degree of stenosis but also due to difficulty manipulating the catheters from the right arm due to tortuosity and calcifications.  The patient had significant arm discomfort with catheter torquing. 4.  Right heart catheterization showed mildly elevated wedge pressure at 18 mmHg, mild pulmonary hypertension at 38/ 19 mmHg and normal cardiac output.  Admitted 12/16/21 due to CP and dyspnea. Placed on CPAP by EMS. Initially given IV lasix but then held due to rising creatinine. Cardiology consult obtained. Cath done with results above. PT/OT evaluations done. Discharged after 7 days.   She presents today for her initial visit with a chief complaint of   Review of Systems    Physical Exam    Assessment & Plan:  1: Chronic heart failure with reduced ejection fraction- - NYHA class - on GDMT of - no ACEi/ ARB due to aortic stenosis - saw cardiology Margarito Courser) 01/01/22 - BNP 12/16/21 was 1055.5  2: HTN- - BP - saw PCP Caryn Section) 12/26/21 -  BMP 12/30/21 reviewed and showed sodium 138, potassium 4.2, creatinine 2.21 & GFR 22  3: DM- - A1c 09/30/21 was 6.2%  4: Severe Aortic stenosis- - Cardiothoracic referral made by cardiology for TAVR

## 2022-01-05 ENCOUNTER — Encounter: Payer: Self-pay | Admitting: Family

## 2022-01-05 ENCOUNTER — Ambulatory Visit: Payer: Medicare Other | Attending: Family | Admitting: Family

## 2022-01-05 VITALS — BP 91/53 | HR 87 | Resp 18 | Wt 140.5 lb

## 2022-01-05 DIAGNOSIS — I130 Hypertensive heart and chronic kidney disease with heart failure and stage 1 through stage 4 chronic kidney disease, or unspecified chronic kidney disease: Secondary | ICD-10-CM | POA: Insufficient documentation

## 2022-01-05 DIAGNOSIS — I350 Nonrheumatic aortic (valve) stenosis: Secondary | ICD-10-CM

## 2022-01-05 DIAGNOSIS — I252 Old myocardial infarction: Secondary | ICD-10-CM | POA: Diagnosis not present

## 2022-01-05 DIAGNOSIS — I352 Nonrheumatic aortic (valve) stenosis with insufficiency: Secondary | ICD-10-CM | POA: Insufficient documentation

## 2022-01-05 DIAGNOSIS — I10 Essential (primary) hypertension: Secondary | ICD-10-CM | POA: Diagnosis not present

## 2022-01-05 DIAGNOSIS — I5022 Chronic systolic (congestive) heart failure: Secondary | ICD-10-CM

## 2022-01-05 DIAGNOSIS — E1122 Type 2 diabetes mellitus with diabetic chronic kidney disease: Secondary | ICD-10-CM

## 2022-01-05 DIAGNOSIS — E785 Hyperlipidemia, unspecified: Secondary | ICD-10-CM | POA: Diagnosis not present

## 2022-01-05 DIAGNOSIS — J45909 Unspecified asthma, uncomplicated: Secondary | ICD-10-CM | POA: Diagnosis not present

## 2022-01-05 DIAGNOSIS — N184 Chronic kidney disease, stage 4 (severe): Secondary | ICD-10-CM

## 2022-01-05 DIAGNOSIS — Z87891 Personal history of nicotine dependence: Secondary | ICD-10-CM | POA: Diagnosis not present

## 2022-01-05 DIAGNOSIS — Z85048 Personal history of other malignant neoplasm of rectum, rectosigmoid junction, and anus: Secondary | ICD-10-CM | POA: Diagnosis not present

## 2022-01-05 DIAGNOSIS — N189 Chronic kidney disease, unspecified: Secondary | ICD-10-CM | POA: Diagnosis not present

## 2022-01-05 NOTE — Patient Instructions (Addendum)
Resume weighing daily and call for an overnight weight gain of 3 pounds or more or a weekly weight gain of more than 5 pounds.   If you have voicemail, please make sure your mailbox is cleaned out so that we may leave a message and please make sure to listen to any voicemails.     

## 2022-01-27 ENCOUNTER — Telehealth: Payer: Self-pay | Admitting: Family Medicine

## 2022-01-27 DIAGNOSIS — E7849 Other hyperlipidemia: Secondary | ICD-10-CM

## 2022-01-27 NOTE — Telephone Encounter (Signed)
[facility] faxed refill request for the following medications:   ezetimibe (ZETIA) 10 MG tablet    Please advise

## 2022-01-28 DIAGNOSIS — Z452 Encounter for adjustment and management of vascular access device: Secondary | ICD-10-CM | POA: Diagnosis not present

## 2022-01-28 DIAGNOSIS — M25561 Pain in right knee: Secondary | ICD-10-CM | POA: Diagnosis not present

## 2022-01-28 DIAGNOSIS — I951 Orthostatic hypotension: Secondary | ICD-10-CM | POA: Diagnosis not present

## 2022-01-28 DIAGNOSIS — Z1152 Encounter for screening for COVID-19: Secondary | ICD-10-CM | POA: Diagnosis not present

## 2022-01-28 DIAGNOSIS — E1122 Type 2 diabetes mellitus with diabetic chronic kidney disease: Secondary | ICD-10-CM | POA: Diagnosis not present

## 2022-01-28 DIAGNOSIS — K921 Melena: Secondary | ICD-10-CM | POA: Diagnosis not present

## 2022-01-28 DIAGNOSIS — Z7901 Long term (current) use of anticoagulants: Secondary | ICD-10-CM | POA: Diagnosis not present

## 2022-01-28 DIAGNOSIS — I252 Old myocardial infarction: Secondary | ICD-10-CM | POA: Diagnosis not present

## 2022-01-28 DIAGNOSIS — I2510 Atherosclerotic heart disease of native coronary artery without angina pectoris: Secondary | ICD-10-CM | POA: Diagnosis not present

## 2022-01-28 DIAGNOSIS — I2693 Single subsegmental pulmonary embolism without acute cor pulmonale: Secondary | ICD-10-CM | POA: Diagnosis not present

## 2022-01-28 DIAGNOSIS — I517 Cardiomegaly: Secondary | ICD-10-CM | POA: Diagnosis not present

## 2022-01-28 DIAGNOSIS — J811 Chronic pulmonary edema: Secondary | ICD-10-CM | POA: Diagnosis not present

## 2022-01-28 DIAGNOSIS — I2699 Other pulmonary embolism without acute cor pulmonale: Secondary | ICD-10-CM | POA: Diagnosis not present

## 2022-01-28 DIAGNOSIS — I350 Nonrheumatic aortic (valve) stenosis: Secondary | ICD-10-CM | POA: Diagnosis not present

## 2022-01-28 DIAGNOSIS — K922 Gastrointestinal hemorrhage, unspecified: Secondary | ICD-10-CM | POA: Diagnosis not present

## 2022-01-28 DIAGNOSIS — D72829 Elevated white blood cell count, unspecified: Secondary | ICD-10-CM | POA: Diagnosis not present

## 2022-01-28 DIAGNOSIS — I708 Atherosclerosis of other arteries: Secondary | ICD-10-CM | POA: Diagnosis not present

## 2022-01-28 DIAGNOSIS — I5023 Acute on chronic systolic (congestive) heart failure: Secondary | ICD-10-CM | POA: Diagnosis not present

## 2022-01-28 DIAGNOSIS — G8918 Other acute postprocedural pain: Secondary | ICD-10-CM | POA: Diagnosis not present

## 2022-01-28 DIAGNOSIS — R918 Other nonspecific abnormal finding of lung field: Secondary | ICD-10-CM | POA: Diagnosis not present

## 2022-01-28 DIAGNOSIS — K640 First degree hemorrhoids: Secondary | ICD-10-CM | POA: Diagnosis not present

## 2022-01-28 DIAGNOSIS — I447 Left bundle-branch block, unspecified: Secondary | ICD-10-CM | POA: Diagnosis not present

## 2022-01-28 DIAGNOSIS — M25461 Effusion, right knee: Secondary | ICD-10-CM | POA: Diagnosis not present

## 2022-01-28 DIAGNOSIS — D6859 Other primary thrombophilia: Secondary | ICD-10-CM | POA: Diagnosis not present

## 2022-01-28 DIAGNOSIS — I5022 Chronic systolic (congestive) heart failure: Secondary | ICD-10-CM | POA: Diagnosis not present

## 2022-01-28 DIAGNOSIS — Z006 Encounter for examination for normal comparison and control in clinical research program: Secondary | ICD-10-CM | POA: Diagnosis not present

## 2022-01-28 DIAGNOSIS — J9601 Acute respiratory failure with hypoxia: Secondary | ICD-10-CM | POA: Diagnosis not present

## 2022-01-28 DIAGNOSIS — I38 Endocarditis, valve unspecified: Secondary | ICD-10-CM | POA: Diagnosis not present

## 2022-01-28 DIAGNOSIS — I429 Cardiomyopathy, unspecified: Secondary | ICD-10-CM | POA: Diagnosis not present

## 2022-01-28 DIAGNOSIS — Z8679 Personal history of other diseases of the circulatory system: Secondary | ICD-10-CM | POA: Diagnosis not present

## 2022-01-28 DIAGNOSIS — I352 Nonrheumatic aortic (valve) stenosis with insufficiency: Secondary | ICD-10-CM | POA: Diagnosis not present

## 2022-01-28 DIAGNOSIS — I428 Other cardiomyopathies: Secondary | ICD-10-CM | POA: Diagnosis not present

## 2022-01-28 DIAGNOSIS — E7800 Pure hypercholesterolemia, unspecified: Secondary | ICD-10-CM | POA: Diagnosis not present

## 2022-01-28 DIAGNOSIS — K449 Diaphragmatic hernia without obstruction or gangrene: Secondary | ICD-10-CM | POA: Diagnosis not present

## 2022-01-28 DIAGNOSIS — Z980 Intestinal bypass and anastomosis status: Secondary | ICD-10-CM | POA: Diagnosis not present

## 2022-01-28 DIAGNOSIS — Z01810 Encounter for preprocedural cardiovascular examination: Secondary | ICD-10-CM | POA: Diagnosis not present

## 2022-01-28 DIAGNOSIS — I130 Hypertensive heart and chronic kidney disease with heart failure and stage 1 through stage 4 chronic kidney disease, or unspecified chronic kidney disease: Secondary | ICD-10-CM | POA: Diagnosis not present

## 2022-01-28 DIAGNOSIS — Z4682 Encounter for fitting and adjustment of non-vascular catheter: Secondary | ICD-10-CM | POA: Diagnosis not present

## 2022-01-28 DIAGNOSIS — N189 Chronic kidney disease, unspecified: Secondary | ICD-10-CM | POA: Diagnosis not present

## 2022-01-28 DIAGNOSIS — I701 Atherosclerosis of renal artery: Secondary | ICD-10-CM | POA: Diagnosis not present

## 2022-01-28 DIAGNOSIS — I25119 Atherosclerotic heart disease of native coronary artery with unspecified angina pectoris: Secondary | ICD-10-CM | POA: Diagnosis not present

## 2022-01-28 DIAGNOSIS — J90 Pleural effusion, not elsewhere classified: Secondary | ICD-10-CM | POA: Diagnosis not present

## 2022-01-28 DIAGNOSIS — R0902 Hypoxemia: Secondary | ICD-10-CM | POA: Diagnosis not present

## 2022-01-28 DIAGNOSIS — I420 Dilated cardiomyopathy: Secondary | ICD-10-CM | POA: Diagnosis not present

## 2022-01-28 DIAGNOSIS — I5020 Unspecified systolic (congestive) heart failure: Secondary | ICD-10-CM | POA: Diagnosis not present

## 2022-01-28 DIAGNOSIS — I700 Atherosclerosis of aorta: Secondary | ICD-10-CM | POA: Diagnosis not present

## 2022-01-28 DIAGNOSIS — K5521 Angiodysplasia of colon with hemorrhage: Secondary | ICD-10-CM | POA: Diagnosis not present

## 2022-01-28 DIAGNOSIS — I7789 Other specified disorders of arteries and arterioles: Secondary | ICD-10-CM | POA: Diagnosis not present

## 2022-01-28 DIAGNOSIS — I959 Hypotension, unspecified: Secondary | ICD-10-CM | POA: Diagnosis not present

## 2022-01-28 DIAGNOSIS — J810 Acute pulmonary edema: Secondary | ICD-10-CM | POA: Diagnosis not present

## 2022-01-28 DIAGNOSIS — K5730 Diverticulosis of large intestine without perforation or abscess without bleeding: Secondary | ICD-10-CM | POA: Diagnosis not present

## 2022-01-28 DIAGNOSIS — Z85038 Personal history of other malignant neoplasm of large intestine: Secondary | ICD-10-CM | POA: Diagnosis not present

## 2022-01-28 DIAGNOSIS — E039 Hypothyroidism, unspecified: Secondary | ICD-10-CM | POA: Diagnosis not present

## 2022-01-28 DIAGNOSIS — N183 Chronic kidney disease, stage 3 unspecified: Secondary | ICD-10-CM | POA: Diagnosis not present

## 2022-01-28 DIAGNOSIS — M25569 Pain in unspecified knee: Secondary | ICD-10-CM | POA: Diagnosis not present

## 2022-01-28 DIAGNOSIS — D62 Acute posthemorrhagic anemia: Secondary | ICD-10-CM | POA: Diagnosis not present

## 2022-01-28 DIAGNOSIS — I82401 Acute embolism and thrombosis of unspecified deep veins of right lower extremity: Secondary | ICD-10-CM | POA: Diagnosis not present

## 2022-01-28 DIAGNOSIS — I82441 Acute embolism and thrombosis of right tibial vein: Secondary | ICD-10-CM | POA: Diagnosis not present

## 2022-01-29 DIAGNOSIS — I2699 Other pulmonary embolism without acute cor pulmonale: Secondary | ICD-10-CM | POA: Diagnosis not present

## 2022-01-29 DIAGNOSIS — I5020 Unspecified systolic (congestive) heart failure: Secondary | ICD-10-CM | POA: Diagnosis not present

## 2022-01-29 DIAGNOSIS — I2510 Atherosclerotic heart disease of native coronary artery without angina pectoris: Secondary | ICD-10-CM | POA: Diagnosis not present

## 2022-01-29 DIAGNOSIS — I447 Left bundle-branch block, unspecified: Secondary | ICD-10-CM | POA: Diagnosis not present

## 2022-01-29 DIAGNOSIS — I2693 Single subsegmental pulmonary embolism without acute cor pulmonale: Secondary | ICD-10-CM | POA: Diagnosis not present

## 2022-01-29 DIAGNOSIS — I708 Atherosclerosis of other arteries: Secondary | ICD-10-CM | POA: Diagnosis not present

## 2022-01-29 DIAGNOSIS — I350 Nonrheumatic aortic (valve) stenosis: Secondary | ICD-10-CM | POA: Diagnosis not present

## 2022-01-29 DIAGNOSIS — N183 Chronic kidney disease, stage 3 unspecified: Secondary | ICD-10-CM | POA: Diagnosis not present

## 2022-01-29 DIAGNOSIS — I429 Cardiomyopathy, unspecified: Secondary | ICD-10-CM | POA: Diagnosis not present

## 2022-01-29 DIAGNOSIS — Z01810 Encounter for preprocedural cardiovascular examination: Secondary | ICD-10-CM | POA: Diagnosis not present

## 2022-01-29 MED ORDER — EZETIMIBE 10 MG PO TABS: 10.0000 mg | ORAL_TABLET | Freq: Every morning | ORAL | 1 refills | Status: DC

## 2022-01-29 NOTE — Telephone Encounter (Signed)
Pt admitted yesterday to [facility]

## 2022-01-30 DIAGNOSIS — I447 Left bundle-branch block, unspecified: Secondary | ICD-10-CM | POA: Diagnosis not present

## 2022-01-30 DIAGNOSIS — I429 Cardiomyopathy, unspecified: Secondary | ICD-10-CM | POA: Diagnosis not present

## 2022-01-30 DIAGNOSIS — N183 Chronic kidney disease, stage 3 unspecified: Secondary | ICD-10-CM | POA: Diagnosis not present

## 2022-01-30 DIAGNOSIS — I2510 Atherosclerotic heart disease of native coronary artery without angina pectoris: Secondary | ICD-10-CM | POA: Diagnosis not present

## 2022-01-30 DIAGNOSIS — I350 Nonrheumatic aortic (valve) stenosis: Secondary | ICD-10-CM | POA: Diagnosis not present

## 2022-01-31 DIAGNOSIS — I350 Nonrheumatic aortic (valve) stenosis: Secondary | ICD-10-CM | POA: Diagnosis not present

## 2022-01-31 DIAGNOSIS — I2510 Atherosclerotic heart disease of native coronary artery without angina pectoris: Secondary | ICD-10-CM | POA: Diagnosis not present

## 2022-01-31 DIAGNOSIS — I447 Left bundle-branch block, unspecified: Secondary | ICD-10-CM | POA: Diagnosis not present

## 2022-01-31 DIAGNOSIS — N183 Chronic kidney disease, stage 3 unspecified: Secondary | ICD-10-CM | POA: Diagnosis not present

## 2022-01-31 DIAGNOSIS — I429 Cardiomyopathy, unspecified: Secondary | ICD-10-CM | POA: Diagnosis not present

## 2022-02-01 DIAGNOSIS — K921 Melena: Secondary | ICD-10-CM | POA: Diagnosis not present

## 2022-02-01 DIAGNOSIS — Z7901 Long term (current) use of anticoagulants: Secondary | ICD-10-CM | POA: Diagnosis not present

## 2022-02-01 DIAGNOSIS — I38 Endocarditis, valve unspecified: Secondary | ICD-10-CM | POA: Diagnosis not present

## 2022-02-01 DIAGNOSIS — M25461 Effusion, right knee: Secondary | ICD-10-CM | POA: Diagnosis not present

## 2022-02-01 DIAGNOSIS — K922 Gastrointestinal hemorrhage, unspecified: Secondary | ICD-10-CM | POA: Diagnosis not present

## 2022-02-01 DIAGNOSIS — I5022 Chronic systolic (congestive) heart failure: Secondary | ICD-10-CM | POA: Diagnosis not present

## 2022-02-01 DIAGNOSIS — I350 Nonrheumatic aortic (valve) stenosis: Secondary | ICD-10-CM | POA: Diagnosis not present

## 2022-02-01 DIAGNOSIS — I2699 Other pulmonary embolism without acute cor pulmonale: Secondary | ICD-10-CM | POA: Diagnosis not present

## 2022-02-01 DIAGNOSIS — I82441 Acute embolism and thrombosis of right tibial vein: Secondary | ICD-10-CM | POA: Diagnosis not present

## 2022-02-01 DIAGNOSIS — D62 Acute posthemorrhagic anemia: Secondary | ICD-10-CM | POA: Diagnosis not present

## 2022-02-02 DIAGNOSIS — D62 Acute posthemorrhagic anemia: Secondary | ICD-10-CM | POA: Diagnosis not present

## 2022-02-02 DIAGNOSIS — Z980 Intestinal bypass and anastomosis status: Secondary | ICD-10-CM | POA: Diagnosis not present

## 2022-02-02 DIAGNOSIS — K640 First degree hemorrhoids: Secondary | ICD-10-CM | POA: Diagnosis not present

## 2022-02-02 DIAGNOSIS — I82401 Acute embolism and thrombosis of unspecified deep veins of right lower extremity: Secondary | ICD-10-CM | POA: Diagnosis not present

## 2022-02-02 DIAGNOSIS — K922 Gastrointestinal hemorrhage, unspecified: Secondary | ICD-10-CM | POA: Diagnosis not present

## 2022-02-02 DIAGNOSIS — K921 Melena: Secondary | ICD-10-CM | POA: Diagnosis not present

## 2022-02-02 DIAGNOSIS — K449 Diaphragmatic hernia without obstruction or gangrene: Secondary | ICD-10-CM | POA: Diagnosis not present

## 2022-02-02 DIAGNOSIS — I350 Nonrheumatic aortic (valve) stenosis: Secondary | ICD-10-CM | POA: Diagnosis not present

## 2022-02-03 DIAGNOSIS — N189 Chronic kidney disease, unspecified: Secondary | ICD-10-CM | POA: Diagnosis not present

## 2022-02-03 DIAGNOSIS — I5020 Unspecified systolic (congestive) heart failure: Secondary | ICD-10-CM | POA: Diagnosis not present

## 2022-02-03 DIAGNOSIS — E1122 Type 2 diabetes mellitus with diabetic chronic kidney disease: Secondary | ICD-10-CM | POA: Diagnosis not present

## 2022-02-03 DIAGNOSIS — M25569 Pain in unspecified knee: Secondary | ICD-10-CM | POA: Diagnosis not present

## 2022-02-03 DIAGNOSIS — Z85038 Personal history of other malignant neoplasm of large intestine: Secondary | ICD-10-CM | POA: Diagnosis not present

## 2022-02-03 DIAGNOSIS — I350 Nonrheumatic aortic (valve) stenosis: Secondary | ICD-10-CM | POA: Diagnosis not present

## 2022-02-03 DIAGNOSIS — I130 Hypertensive heart and chronic kidney disease with heart failure and stage 1 through stage 4 chronic kidney disease, or unspecified chronic kidney disease: Secondary | ICD-10-CM | POA: Diagnosis not present

## 2022-02-03 DIAGNOSIS — I2693 Single subsegmental pulmonary embolism without acute cor pulmonale: Secondary | ICD-10-CM | POA: Diagnosis not present

## 2022-02-03 DIAGNOSIS — K922 Gastrointestinal hemorrhage, unspecified: Secondary | ICD-10-CM | POA: Diagnosis not present

## 2022-02-03 DIAGNOSIS — I447 Left bundle-branch block, unspecified: Secondary | ICD-10-CM | POA: Diagnosis not present

## 2022-02-03 DIAGNOSIS — E039 Hypothyroidism, unspecified: Secondary | ICD-10-CM | POA: Diagnosis not present

## 2022-02-03 DIAGNOSIS — Z8679 Personal history of other diseases of the circulatory system: Secondary | ICD-10-CM | POA: Diagnosis not present

## 2022-02-04 DIAGNOSIS — K922 Gastrointestinal hemorrhage, unspecified: Secondary | ICD-10-CM | POA: Diagnosis not present

## 2022-02-04 DIAGNOSIS — D62 Acute posthemorrhagic anemia: Secondary | ICD-10-CM | POA: Diagnosis not present

## 2022-02-04 DIAGNOSIS — I5020 Unspecified systolic (congestive) heart failure: Secondary | ICD-10-CM | POA: Diagnosis not present

## 2022-02-04 DIAGNOSIS — N189 Chronic kidney disease, unspecified: Secondary | ICD-10-CM | POA: Diagnosis not present

## 2022-02-04 DIAGNOSIS — K5730 Diverticulosis of large intestine without perforation or abscess without bleeding: Secondary | ICD-10-CM | POA: Diagnosis not present

## 2022-02-04 DIAGNOSIS — I447 Left bundle-branch block, unspecified: Secondary | ICD-10-CM | POA: Diagnosis not present

## 2022-02-04 DIAGNOSIS — I130 Hypertensive heart and chronic kidney disease with heart failure and stage 1 through stage 4 chronic kidney disease, or unspecified chronic kidney disease: Secondary | ICD-10-CM | POA: Diagnosis not present

## 2022-02-04 DIAGNOSIS — K640 First degree hemorrhoids: Secondary | ICD-10-CM | POA: Diagnosis not present

## 2022-02-04 DIAGNOSIS — I350 Nonrheumatic aortic (valve) stenosis: Secondary | ICD-10-CM | POA: Diagnosis not present

## 2022-02-04 DIAGNOSIS — K921 Melena: Secondary | ICD-10-CM | POA: Diagnosis not present

## 2022-02-04 DIAGNOSIS — K5521 Angiodysplasia of colon with hemorrhage: Secondary | ICD-10-CM | POA: Diagnosis not present

## 2022-02-05 DIAGNOSIS — I5020 Unspecified systolic (congestive) heart failure: Secondary | ICD-10-CM | POA: Diagnosis not present

## 2022-02-05 DIAGNOSIS — N189 Chronic kidney disease, unspecified: Secondary | ICD-10-CM | POA: Diagnosis not present

## 2022-02-05 DIAGNOSIS — I447 Left bundle-branch block, unspecified: Secondary | ICD-10-CM | POA: Diagnosis not present

## 2022-02-05 DIAGNOSIS — I350 Nonrheumatic aortic (valve) stenosis: Secondary | ICD-10-CM | POA: Diagnosis not present

## 2022-02-05 DIAGNOSIS — I130 Hypertensive heart and chronic kidney disease with heart failure and stage 1 through stage 4 chronic kidney disease, or unspecified chronic kidney disease: Secondary | ICD-10-CM | POA: Diagnosis not present

## 2022-02-06 DIAGNOSIS — R918 Other nonspecific abnormal finding of lung field: Secondary | ICD-10-CM | POA: Diagnosis not present

## 2022-02-06 DIAGNOSIS — K922 Gastrointestinal hemorrhage, unspecified: Secondary | ICD-10-CM | POA: Diagnosis not present

## 2022-02-06 DIAGNOSIS — I2699 Other pulmonary embolism without acute cor pulmonale: Secondary | ICD-10-CM | POA: Diagnosis not present

## 2022-02-06 DIAGNOSIS — I7789 Other specified disorders of arteries and arterioles: Secondary | ICD-10-CM | POA: Diagnosis not present

## 2022-02-06 DIAGNOSIS — I517 Cardiomegaly: Secondary | ICD-10-CM | POA: Diagnosis not present

## 2022-02-06 DIAGNOSIS — I700 Atherosclerosis of aorta: Secondary | ICD-10-CM | POA: Diagnosis not present

## 2022-02-06 DIAGNOSIS — I350 Nonrheumatic aortic (valve) stenosis: Secondary | ICD-10-CM | POA: Diagnosis not present

## 2022-02-06 DIAGNOSIS — R0902 Hypoxemia: Secondary | ICD-10-CM | POA: Diagnosis not present

## 2022-02-06 DIAGNOSIS — J9601 Acute respiratory failure with hypoxia: Secondary | ICD-10-CM | POA: Diagnosis not present

## 2022-02-06 DIAGNOSIS — J90 Pleural effusion, not elsewhere classified: Secondary | ICD-10-CM | POA: Diagnosis not present

## 2022-02-07 DIAGNOSIS — I447 Left bundle-branch block, unspecified: Secondary | ICD-10-CM | POA: Diagnosis not present

## 2022-02-07 DIAGNOSIS — N189 Chronic kidney disease, unspecified: Secondary | ICD-10-CM | POA: Diagnosis not present

## 2022-02-07 DIAGNOSIS — K5521 Angiodysplasia of colon with hemorrhage: Secondary | ICD-10-CM | POA: Diagnosis not present

## 2022-02-07 DIAGNOSIS — E039 Hypothyroidism, unspecified: Secondary | ICD-10-CM | POA: Diagnosis not present

## 2022-02-07 DIAGNOSIS — I5020 Unspecified systolic (congestive) heart failure: Secondary | ICD-10-CM | POA: Diagnosis not present

## 2022-02-07 DIAGNOSIS — I2699 Other pulmonary embolism without acute cor pulmonale: Secondary | ICD-10-CM | POA: Diagnosis not present

## 2022-02-07 DIAGNOSIS — K922 Gastrointestinal hemorrhage, unspecified: Secondary | ICD-10-CM | POA: Diagnosis not present

## 2022-02-07 DIAGNOSIS — M25569 Pain in unspecified knee: Secondary | ICD-10-CM | POA: Diagnosis not present

## 2022-02-07 DIAGNOSIS — J811 Chronic pulmonary edema: Secondary | ICD-10-CM | POA: Diagnosis not present

## 2022-02-07 DIAGNOSIS — I130 Hypertensive heart and chronic kidney disease with heart failure and stage 1 through stage 4 chronic kidney disease, or unspecified chronic kidney disease: Secondary | ICD-10-CM | POA: Diagnosis not present

## 2022-02-07 DIAGNOSIS — I350 Nonrheumatic aortic (valve) stenosis: Secondary | ICD-10-CM | POA: Diagnosis not present

## 2022-02-07 DIAGNOSIS — J9601 Acute respiratory failure with hypoxia: Secondary | ICD-10-CM | POA: Diagnosis not present

## 2022-02-08 DIAGNOSIS — N189 Chronic kidney disease, unspecified: Secondary | ICD-10-CM | POA: Diagnosis not present

## 2022-02-08 DIAGNOSIS — I5020 Unspecified systolic (congestive) heart failure: Secondary | ICD-10-CM | POA: Diagnosis not present

## 2022-02-08 DIAGNOSIS — M25569 Pain in unspecified knee: Secondary | ICD-10-CM | POA: Diagnosis not present

## 2022-02-08 DIAGNOSIS — E039 Hypothyroidism, unspecified: Secondary | ICD-10-CM | POA: Diagnosis not present

## 2022-02-08 DIAGNOSIS — K5521 Angiodysplasia of colon with hemorrhage: Secondary | ICD-10-CM | POA: Diagnosis not present

## 2022-02-08 DIAGNOSIS — I959 Hypotension, unspecified: Secondary | ICD-10-CM | POA: Diagnosis not present

## 2022-02-08 DIAGNOSIS — J811 Chronic pulmonary edema: Secondary | ICD-10-CM | POA: Diagnosis not present

## 2022-02-08 DIAGNOSIS — I350 Nonrheumatic aortic (valve) stenosis: Secondary | ICD-10-CM | POA: Diagnosis not present

## 2022-02-08 DIAGNOSIS — K922 Gastrointestinal hemorrhage, unspecified: Secondary | ICD-10-CM | POA: Diagnosis not present

## 2022-02-08 DIAGNOSIS — I2699 Other pulmonary embolism without acute cor pulmonale: Secondary | ICD-10-CM | POA: Diagnosis not present

## 2022-02-08 DIAGNOSIS — J9601 Acute respiratory failure with hypoxia: Secondary | ICD-10-CM | POA: Diagnosis not present

## 2022-02-08 DIAGNOSIS — I447 Left bundle-branch block, unspecified: Secondary | ICD-10-CM | POA: Diagnosis not present

## 2022-02-09 DIAGNOSIS — J9601 Acute respiratory failure with hypoxia: Secondary | ICD-10-CM | POA: Diagnosis not present

## 2022-02-09 DIAGNOSIS — K922 Gastrointestinal hemorrhage, unspecified: Secondary | ICD-10-CM | POA: Diagnosis not present

## 2022-02-09 DIAGNOSIS — I2699 Other pulmonary embolism without acute cor pulmonale: Secondary | ICD-10-CM | POA: Diagnosis not present

## 2022-02-09 DIAGNOSIS — J811 Chronic pulmonary edema: Secondary | ICD-10-CM | POA: Diagnosis not present

## 2022-02-09 DIAGNOSIS — I350 Nonrheumatic aortic (valve) stenosis: Secondary | ICD-10-CM | POA: Diagnosis not present

## 2022-02-10 DIAGNOSIS — J9601 Acute respiratory failure with hypoxia: Secondary | ICD-10-CM | POA: Diagnosis not present

## 2022-02-10 DIAGNOSIS — J811 Chronic pulmonary edema: Secondary | ICD-10-CM | POA: Diagnosis not present

## 2022-02-10 DIAGNOSIS — I2699 Other pulmonary embolism without acute cor pulmonale: Secondary | ICD-10-CM | POA: Diagnosis not present

## 2022-02-10 DIAGNOSIS — K922 Gastrointestinal hemorrhage, unspecified: Secondary | ICD-10-CM | POA: Diagnosis not present

## 2022-02-10 DIAGNOSIS — I350 Nonrheumatic aortic (valve) stenosis: Secondary | ICD-10-CM | POA: Diagnosis not present

## 2022-02-11 DIAGNOSIS — I2699 Other pulmonary embolism without acute cor pulmonale: Secondary | ICD-10-CM | POA: Diagnosis not present

## 2022-02-11 DIAGNOSIS — K922 Gastrointestinal hemorrhage, unspecified: Secondary | ICD-10-CM | POA: Diagnosis not present

## 2022-02-11 DIAGNOSIS — J811 Chronic pulmonary edema: Secondary | ICD-10-CM | POA: Diagnosis not present

## 2022-02-11 DIAGNOSIS — J9601 Acute respiratory failure with hypoxia: Secondary | ICD-10-CM | POA: Diagnosis not present

## 2022-02-11 DIAGNOSIS — I350 Nonrheumatic aortic (valve) stenosis: Secondary | ICD-10-CM | POA: Diagnosis not present

## 2022-02-12 DIAGNOSIS — I82401 Acute embolism and thrombosis of unspecified deep veins of right lower extremity: Secondary | ICD-10-CM | POA: Diagnosis not present

## 2022-02-12 DIAGNOSIS — I350 Nonrheumatic aortic (valve) stenosis: Secondary | ICD-10-CM | POA: Diagnosis not present

## 2022-02-12 DIAGNOSIS — I951 Orthostatic hypotension: Secondary | ICD-10-CM | POA: Diagnosis not present

## 2022-02-12 DIAGNOSIS — I428 Other cardiomyopathies: Secondary | ICD-10-CM | POA: Diagnosis not present

## 2022-02-12 DIAGNOSIS — K5521 Angiodysplasia of colon with hemorrhage: Secondary | ICD-10-CM | POA: Diagnosis not present

## 2022-02-12 DIAGNOSIS — I2699 Other pulmonary embolism without acute cor pulmonale: Secondary | ICD-10-CM | POA: Diagnosis not present

## 2022-02-12 DIAGNOSIS — I5023 Acute on chronic systolic (congestive) heart failure: Secondary | ICD-10-CM | POA: Diagnosis not present

## 2022-02-13 DIAGNOSIS — K5521 Angiodysplasia of colon with hemorrhage: Secondary | ICD-10-CM | POA: Diagnosis not present

## 2022-02-13 DIAGNOSIS — I5023 Acute on chronic systolic (congestive) heart failure: Secondary | ICD-10-CM | POA: Diagnosis not present

## 2022-02-13 DIAGNOSIS — I428 Other cardiomyopathies: Secondary | ICD-10-CM | POA: Diagnosis not present

## 2022-02-13 DIAGNOSIS — I350 Nonrheumatic aortic (valve) stenosis: Secondary | ICD-10-CM | POA: Diagnosis not present

## 2022-02-13 DIAGNOSIS — I2699 Other pulmonary embolism without acute cor pulmonale: Secondary | ICD-10-CM | POA: Diagnosis not present

## 2022-02-13 DIAGNOSIS — I82401 Acute embolism and thrombosis of unspecified deep veins of right lower extremity: Secondary | ICD-10-CM | POA: Diagnosis not present

## 2022-02-13 DIAGNOSIS — I951 Orthostatic hypotension: Secondary | ICD-10-CM | POA: Diagnosis not present

## 2022-02-14 DIAGNOSIS — I5023 Acute on chronic systolic (congestive) heart failure: Secondary | ICD-10-CM | POA: Diagnosis not present

## 2022-02-14 DIAGNOSIS — I82401 Acute embolism and thrombosis of unspecified deep veins of right lower extremity: Secondary | ICD-10-CM | POA: Diagnosis not present

## 2022-02-14 DIAGNOSIS — I951 Orthostatic hypotension: Secondary | ICD-10-CM | POA: Diagnosis not present

## 2022-02-14 DIAGNOSIS — I350 Nonrheumatic aortic (valve) stenosis: Secondary | ICD-10-CM | POA: Diagnosis not present

## 2022-02-14 DIAGNOSIS — K5521 Angiodysplasia of colon with hemorrhage: Secondary | ICD-10-CM | POA: Diagnosis not present

## 2022-02-15 DIAGNOSIS — I951 Orthostatic hypotension: Secondary | ICD-10-CM | POA: Diagnosis not present

## 2022-02-15 DIAGNOSIS — I5023 Acute on chronic systolic (congestive) heart failure: Secondary | ICD-10-CM | POA: Diagnosis not present

## 2022-02-15 DIAGNOSIS — I350 Nonrheumatic aortic (valve) stenosis: Secondary | ICD-10-CM | POA: Diagnosis not present

## 2022-02-15 DIAGNOSIS — I82401 Acute embolism and thrombosis of unspecified deep veins of right lower extremity: Secondary | ICD-10-CM | POA: Diagnosis not present

## 2022-02-15 DIAGNOSIS — K5521 Angiodysplasia of colon with hemorrhage: Secondary | ICD-10-CM | POA: Diagnosis not present

## 2022-02-16 DIAGNOSIS — I25119 Atherosclerotic heart disease of native coronary artery with unspecified angina pectoris: Secondary | ICD-10-CM | POA: Diagnosis not present

## 2022-02-16 DIAGNOSIS — I5023 Acute on chronic systolic (congestive) heart failure: Secondary | ICD-10-CM | POA: Diagnosis not present

## 2022-02-16 DIAGNOSIS — I82401 Acute embolism and thrombosis of unspecified deep veins of right lower extremity: Secondary | ICD-10-CM | POA: Diagnosis not present

## 2022-02-16 DIAGNOSIS — I2699 Other pulmonary embolism without acute cor pulmonale: Secondary | ICD-10-CM | POA: Diagnosis not present

## 2022-02-16 DIAGNOSIS — I739 Peripheral vascular disease, unspecified: Secondary | ICD-10-CM | POA: Insufficient documentation

## 2022-02-16 DIAGNOSIS — I428 Other cardiomyopathies: Secondary | ICD-10-CM | POA: Diagnosis not present

## 2022-02-16 DIAGNOSIS — I350 Nonrheumatic aortic (valve) stenosis: Secondary | ICD-10-CM | POA: Diagnosis not present

## 2022-02-16 DIAGNOSIS — K5521 Angiodysplasia of colon with hemorrhage: Secondary | ICD-10-CM | POA: Diagnosis not present

## 2022-02-16 DIAGNOSIS — J90 Pleural effusion, not elsewhere classified: Secondary | ICD-10-CM | POA: Diagnosis not present

## 2022-02-17 ENCOUNTER — Encounter: Payer: Medicare Other | Admitting: Family

## 2022-02-17 DIAGNOSIS — I428 Other cardiomyopathies: Secondary | ICD-10-CM | POA: Diagnosis not present

## 2022-02-17 DIAGNOSIS — I82401 Acute embolism and thrombosis of unspecified deep veins of right lower extremity: Secondary | ICD-10-CM | POA: Diagnosis not present

## 2022-02-17 DIAGNOSIS — I5023 Acute on chronic systolic (congestive) heart failure: Secondary | ICD-10-CM | POA: Diagnosis not present

## 2022-02-17 DIAGNOSIS — I350 Nonrheumatic aortic (valve) stenosis: Secondary | ICD-10-CM | POA: Diagnosis not present

## 2022-02-17 DIAGNOSIS — I2699 Other pulmonary embolism without acute cor pulmonale: Secondary | ICD-10-CM | POA: Diagnosis not present

## 2022-02-17 DIAGNOSIS — K5521 Angiodysplasia of colon with hemorrhage: Secondary | ICD-10-CM | POA: Diagnosis not present

## 2022-02-17 DIAGNOSIS — I25119 Atherosclerotic heart disease of native coronary artery with unspecified angina pectoris: Secondary | ICD-10-CM | POA: Diagnosis not present

## 2022-02-18 DIAGNOSIS — I350 Nonrheumatic aortic (valve) stenosis: Secondary | ICD-10-CM | POA: Diagnosis not present

## 2022-02-18 DIAGNOSIS — Z952 Presence of prosthetic heart valve: Secondary | ICD-10-CM | POA: Insufficient documentation

## 2022-02-18 DIAGNOSIS — Z006 Encounter for examination for normal comparison and control in clinical research program: Secondary | ICD-10-CM | POA: Diagnosis not present

## 2022-02-18 DIAGNOSIS — Z4682 Encounter for fitting and adjustment of non-vascular catheter: Secondary | ICD-10-CM | POA: Diagnosis not present

## 2022-02-18 DIAGNOSIS — J811 Chronic pulmonary edema: Secondary | ICD-10-CM | POA: Diagnosis not present

## 2022-02-19 DIAGNOSIS — R918 Other nonspecific abnormal finding of lung field: Secondary | ICD-10-CM | POA: Diagnosis not present

## 2022-02-19 DIAGNOSIS — I350 Nonrheumatic aortic (valve) stenosis: Secondary | ICD-10-CM | POA: Diagnosis not present

## 2022-02-19 DIAGNOSIS — I2699 Other pulmonary embolism without acute cor pulmonale: Secondary | ICD-10-CM | POA: Insufficient documentation

## 2022-02-19 DIAGNOSIS — Z4682 Encounter for fitting and adjustment of non-vascular catheter: Secondary | ICD-10-CM | POA: Diagnosis not present

## 2022-02-19 DIAGNOSIS — Z452 Encounter for adjustment and management of vascular access device: Secondary | ICD-10-CM | POA: Diagnosis not present

## 2022-02-23 ENCOUNTER — Telehealth: Payer: Self-pay | Admitting: Family Medicine

## 2022-02-23 DIAGNOSIS — N189 Chronic kidney disease, unspecified: Secondary | ICD-10-CM | POA: Diagnosis not present

## 2022-02-23 DIAGNOSIS — I130 Hypertensive heart and chronic kidney disease with heart failure and stage 1 through stage 4 chronic kidney disease, or unspecified chronic kidney disease: Secondary | ICD-10-CM | POA: Diagnosis not present

## 2022-02-23 DIAGNOSIS — I5022 Chronic systolic (congestive) heart failure: Secondary | ICD-10-CM | POA: Diagnosis not present

## 2022-02-23 DIAGNOSIS — I739 Peripheral vascular disease, unspecified: Secondary | ICD-10-CM | POA: Diagnosis not present

## 2022-02-23 DIAGNOSIS — I420 Dilated cardiomyopathy: Secondary | ICD-10-CM | POA: Diagnosis not present

## 2022-02-23 DIAGNOSIS — Z48812 Encounter for surgical aftercare following surgery on the circulatory system: Secondary | ICD-10-CM | POA: Diagnosis not present

## 2022-02-23 DIAGNOSIS — E1122 Type 2 diabetes mellitus with diabetic chronic kidney disease: Secondary | ICD-10-CM | POA: Diagnosis not present

## 2022-02-23 DIAGNOSIS — I2699 Other pulmonary embolism without acute cor pulmonale: Secondary | ICD-10-CM | POA: Diagnosis not present

## 2022-02-23 DIAGNOSIS — I2510 Atherosclerotic heart disease of native coronary artery without angina pectoris: Secondary | ICD-10-CM | POA: Diagnosis not present

## 2022-02-23 DIAGNOSIS — Z7901 Long term (current) use of anticoagulants: Secondary | ICD-10-CM | POA: Diagnosis not present

## 2022-02-23 NOTE — Telephone Encounter (Signed)
Copied from [place] [id number]. Topic: Quick Communication - Home Health Verbal Orders >> Feb 23, 2022 10:48 AM Marcellus Scott wrote: Caller/Agency: Cherly Anderson / [place] Number:  [phone]  Requesting OT/PT/Skilled Nursing/Social Work/Speech Therapy: PT Frequency:2w2,1w1,

## 2022-02-23 NOTE — Telephone Encounter (Signed)
Ok for verbal orders.    Antwoine Zorn Simmons-Robinson, MD  [facility]  

## 2022-02-24 ENCOUNTER — Other Ambulatory Visit: Payer: Medicare Other

## 2022-02-24 NOTE — Telephone Encounter (Signed)
Left message for ok on verbal orders

## 2022-02-25 ENCOUNTER — Telehealth: Payer: Self-pay | Admitting: *Deleted

## 2022-02-25 NOTE — Telephone Encounter (Signed)
Pt's daughter called concerned about wt gain, pt was at [facility] for past 3-4 wks with bilat PEs and underwent TAVR, pt was d/c'd home last week and wt was 126.6. She states wt continued to drop some a couple of days ago she was at 122.6 and today she is back up to 126.6. Pt has been weighing at different times and not daily. We discussed importance of daily weights, same time of day, with same amount of clothes on. Denies sob/edema. Pt is currently taking Furosemide 20 mg Daily. We discussed diet, pt had decreased appetite in hospital but is now eating more. She states BP was a little higher than normal today at 140s/80s but then returned to normal, discussed keeping a log of readings which she will begin doing. She is sch to f/u with [place] next week and will take readings to that appt. If weight up tomorrow she will call us back.

## 2022-02-27 DIAGNOSIS — I5022 Chronic systolic (congestive) heart failure: Secondary | ICD-10-CM | POA: Diagnosis not present

## 2022-02-27 DIAGNOSIS — Z48812 Encounter for surgical aftercare following surgery on the circulatory system: Secondary | ICD-10-CM | POA: Diagnosis not present

## 2022-02-27 DIAGNOSIS — I739 Peripheral vascular disease, unspecified: Secondary | ICD-10-CM | POA: Diagnosis not present

## 2022-02-27 DIAGNOSIS — I130 Hypertensive heart and chronic kidney disease with heart failure and stage 1 through stage 4 chronic kidney disease, or unspecified chronic kidney disease: Secondary | ICD-10-CM | POA: Diagnosis not present

## 2022-02-27 DIAGNOSIS — I2510 Atherosclerotic heart disease of native coronary artery without angina pectoris: Secondary | ICD-10-CM | POA: Diagnosis not present

## 2022-02-27 DIAGNOSIS — Z7901 Long term (current) use of anticoagulants: Secondary | ICD-10-CM | POA: Diagnosis not present

## 2022-02-27 DIAGNOSIS — I420 Dilated cardiomyopathy: Secondary | ICD-10-CM | POA: Diagnosis not present

## 2022-02-27 DIAGNOSIS — E1122 Type 2 diabetes mellitus with diabetic chronic kidney disease: Secondary | ICD-10-CM | POA: Diagnosis not present

## 2022-02-27 DIAGNOSIS — I2699 Other pulmonary embolism without acute cor pulmonale: Secondary | ICD-10-CM | POA: Diagnosis not present

## 2022-02-27 DIAGNOSIS — N189 Chronic kidney disease, unspecified: Secondary | ICD-10-CM | POA: Diagnosis not present

## 2022-03-02 ENCOUNTER — Telehealth: Payer: Self-pay

## 2022-03-02 NOTE — Telephone Encounter (Signed)
Please contact patient with recommendation to continue PT for heart health and building strength, endurance and mobility after hospital stay. This is recommended by both surgeon, PCP and physical therapist.    Call returned from Cherly Anderson, PT  Patient voiced concerned for her weight in regards to her HF and was counseled to manage sodium intake however patient was concerned about everything in her diet.   Patient reported to have done well with PT sessions including walking, standing activities but voiced concern that she should not be pushing or pulling after reading information independently.    Patient had her daughter call to state that she was hesitant to continue with PT due to feeling like she did too much on the session from 03/02/22 although the patient's daughter reported that the patient was not in pain.    When PT talked with her, the patient thought she was exerting herself beyond her limits despite encouragement that therapy was good for her overall cardiovascular health.   Raquel Sarna states that patient has been issued discharge from PT but has not yet completed this process with the patient.   She recommended placing a new referral for PT if the patient wishes to continue with PT.   Eulis Foster, MD  [facility]

## 2022-03-02 NOTE — Telephone Encounter (Signed)
Copied from [place] [id number]. Topic: Quick Communication - Home Health Verbal Orders >> Mar 02, 2022 11:00 AM Everette C wrote: Caller/Agency: Emily / [place] Number: [phone] Requesting OT/PT/Skilled Nursing/Social Work/Speech Therapy: PT  The patient was seen 02/27/22 and expressed concern to their daughter after their visit   The patient has refused further PT due to a recent procedure and has declined further visits   Please contact further when possible

## 2022-03-02 NOTE — Telephone Encounter (Signed)
Reviewed note from patient's call. Will await decision for continued PT.  Eulis Foster, MD  [facility]

## 2022-03-02 NOTE — Telephone Encounter (Signed)
Attempted to contact Tracy Silva to discuss more details. Left voicemail.   Please contact patient to discuss more detail regarding concerns expressed to patient's daughter and if she will need further PT or would like to discontinue services.   Eulis Foster, MD  [facility]

## 2022-03-02 NOTE — Telephone Encounter (Signed)
Patient's daughter states that she is going to talk to the Cardiologist because patient needs a pace maker and wants to see if Cardio tell hers about patient needing PT. Reports that patient's concerns is about the excessive PT and doesn't want to be doing something that will hurt her more. Will reach out to [place] depending on how it goes with the cardiologist.

## 2022-03-04 DIAGNOSIS — I2699 Other pulmonary embolism without acute cor pulmonale: Secondary | ICD-10-CM | POA: Diagnosis not present

## 2022-03-04 DIAGNOSIS — I447 Left bundle-branch block, unspecified: Secondary | ICD-10-CM | POA: Diagnosis not present

## 2022-03-04 DIAGNOSIS — I10 Essential (primary) hypertension: Secondary | ICD-10-CM | POA: Diagnosis not present

## 2022-03-04 DIAGNOSIS — I2510 Atherosclerotic heart disease of native coronary artery without angina pectoris: Secondary | ICD-10-CM | POA: Diagnosis not present

## 2022-03-04 DIAGNOSIS — Z952 Presence of prosthetic heart valve: Secondary | ICD-10-CM | POA: Diagnosis not present

## 2022-03-04 DIAGNOSIS — I420 Dilated cardiomyopathy: Secondary | ICD-10-CM | POA: Diagnosis not present

## 2022-03-04 DIAGNOSIS — Z79899 Other long term (current) drug therapy: Secondary | ICD-10-CM | POA: Diagnosis not present

## 2022-03-18 ENCOUNTER — Telehealth: Payer: Self-pay | Admitting: Family Medicine

## 2022-03-18 NOTE — Telephone Encounter (Signed)
Is she on Eliquis?

## 2022-03-18 NOTE — Telephone Encounter (Signed)
[facility] faxed refill request for the following medications:  Eliquis 38m   Please advise.

## 2022-03-19 ENCOUNTER — Other Ambulatory Visit: Payer: Self-pay | Admitting: Family Medicine

## 2022-03-19 MED ORDER — APIXABAN 5 MG PO TABS: 5.0000 mg | ORAL_TABLET | Freq: Two times a day (BID) | ORAL | 2 refills | Status: DC

## 2022-03-19 NOTE — Telephone Encounter (Signed)
Patient's medication list updated to include eliquis 57m twice daily    MEulis Foster MD  [facility]

## 2022-03-20 ENCOUNTER — Other Ambulatory Visit: Payer: Self-pay | Admitting: Family Medicine

## 2022-03-20 MED ORDER — APIXABAN 5 MG PO TABS: 5.0000 mg | ORAL_TABLET | Freq: Two times a day (BID) | ORAL | 2 refills | Status: DC

## 2022-03-20 NOTE — Progress Notes (Signed)
Spoke with Amy pharmacist and gave verbal order.  Per Amy they are having trouble getting the e-prescribed medication but are able to verified the insurance and that [facility] is able to get the e-prescibe meds but not able to verified insurance.

## 2022-03-24 DIAGNOSIS — I10 Essential (primary) hypertension: Secondary | ICD-10-CM | POA: Diagnosis not present

## 2022-03-24 DIAGNOSIS — I2510 Atherosclerotic heart disease of native coronary artery without angina pectoris: Secondary | ICD-10-CM | POA: Diagnosis not present

## 2022-03-24 DIAGNOSIS — E7800 Pure hypercholesterolemia, unspecified: Secondary | ICD-10-CM | POA: Diagnosis not present

## 2022-03-24 DIAGNOSIS — Z952 Presence of prosthetic heart valve: Secondary | ICD-10-CM | POA: Diagnosis not present

## 2022-03-24 DIAGNOSIS — I2699 Other pulmonary embolism without acute cor pulmonale: Secondary | ICD-10-CM | POA: Diagnosis not present

## 2022-03-24 DIAGNOSIS — R0602 Shortness of breath: Secondary | ICD-10-CM | POA: Diagnosis not present

## 2022-03-24 DIAGNOSIS — I420 Dilated cardiomyopathy: Secondary | ICD-10-CM | POA: Diagnosis not present

## 2022-04-08 DIAGNOSIS — I447 Left bundle-branch block, unspecified: Secondary | ICD-10-CM | POA: Diagnosis not present

## 2022-04-08 DIAGNOSIS — I5020 Unspecified systolic (congestive) heart failure: Secondary | ICD-10-CM | POA: Diagnosis not present

## 2022-04-08 DIAGNOSIS — Z952 Presence of prosthetic heart valve: Secondary | ICD-10-CM | POA: Diagnosis not present

## 2022-04-08 DIAGNOSIS — I2693 Single subsegmental pulmonary embolism without acute cor pulmonale: Secondary | ICD-10-CM | POA: Diagnosis not present

## 2022-05-11 ENCOUNTER — Other Ambulatory Visit: Payer: Self-pay | Admitting: Family Medicine

## 2022-06-29 ENCOUNTER — Telehealth: Payer: Self-pay

## 2022-06-29 DIAGNOSIS — I10 Essential (primary) hypertension: Secondary | ICD-10-CM

## 2022-06-29 DIAGNOSIS — I5043 Acute on chronic combined systolic (congestive) and diastolic (congestive) heart failure: Secondary | ICD-10-CM

## 2022-06-29 NOTE — Telephone Encounter (Signed)
PharmD reviewed patient chart to assess eligibility for [facility] Coordination services. Patient was determined to be a good candidate for the program given the complexity of the medication regimen and/or overall risk for hospitalization and increased utilization.  Referral entered in order to outreach patient and offer appointment with PharmD. Referral cosigned to PCP.   Angelena Sole, PharmD, Patsy [place], CPP  Clinical Pharmacist Practitioner  [place] [phone]

## 2022-07-01 ENCOUNTER — Telehealth: Payer: Self-pay

## 2022-07-02 NOTE — Progress Notes (Cosign Needed)
Care Management & Coordination Services Pharmacy Team Pharmacy Assistant   Name: Tracy Silva  MRN: [id number] DOB: 19-Apr-1940  Tracy Silva is an 82 y.o. year old female who will present for her initial Care Coordination visit with the clinical pharmacist Angelena Sole, CPP on 07/03/2022 at 1000.  Reason for Encounter: Appointment Reminder  Contacted patient to confirm in office appointment with Angelena Sole, PharmD on 07/03/2022 at 1000. I tried contacting the patient to complete IQ, and confirm appointment prior to her initial visit with CPP on tomorrow. I called the phone number on file, but it just rang with no voice mail picking up. I was not successful in my attempt and I was not able to LVM.   Chart review: Conditions to be addressed/monitored: CHF, HTN, HLD, DMII, Depression, GERD, Hypothyroidism, Allergic Rhinitis, and Aortic Incompetence, Left Bundle Branch Block, Nonrheumatic aortic valve stenosis, Rectal cancer, Cancer of descending colon, Colon cancer, Iron deficiency anemia  Recent office visits:  None ID  Recent consult visits:  04/08/2022 [name] [facility] Cardiology) for Initial Consult - I am unable to view this note for any updates  03/04/2022 Ladean Raya, PA (Cardiology) for Follow-up- Defer ACE or ARB in light of CKD , Lab orders placed, Patient to follow-up in 3 months  01/16/2022 Colette Ribas, MD (Cardiothoracic Surgery) for Initial Consult- Stop metoprolol to tartrate. Begin metoprolol succinate 25 mg. You may take this half a tablet equal to 12 and half milligrams twice daily, Stop Plavix/clopidogrel. No follow-up noted  01/05/2022 Clarisa Kindred, FNP (CHF Clinic) for CHF- No medication changes noted, No orders placed, patient to follow-up in 6 weeks.   01/01/2022 Ladean Raya, PA (Cardiology) for Cardiothoracic Surgery- No medication changes noted, Referral to Dr. Romeo Apple for consideration of TAVR placed, Patient to follow-up in 2  months  Hospital visits:  Medication Reconciliation was completed by comparing discharge summary, patient's EMR and Pharmacy list, and upon discussion with patient.  Admitted to the hospital on 01/28/2022 due to S/P TAVR. Discharge date was 02/19/2022. Discharged from [facility].    New?Medications Started at [facility] Discharge:?? -Started  eliquis, you will continue 10mg  twice daily for X days, and after that you will continue 5mg  for X days  empagliflozin 10mg  daily for your diabetes and your heart   Medication Changes at Hospital Discharge: -Changed  metoprolol succinate (TOPROL-XL) 25 MG XL tablet Take 0.5 tablets (12.5 mg total) by mouth once daily Qty: 15 tablet, Refills: 11   Medications Discontinued at Hospital Discharge: -Stopped None ID  Medications that remain the same after Hospital Discharge:??  -All other medications will remain the same.    Medications: Outpatient Encounter Medications as of 07/01/2022  Medication Sig   aspirin 81 MG chewable tablet Chew 1 tablet (81 mg total) by mouth daily.   clopidogrel (PLAVIX) 75 MG tablet Take 1 tablet (75 mg total) by mouth daily.   ELIQUIS 5 MG TABS tablet TAKE ONE (1) TABLET BY MOUTH TWO TIMES PER DAY   ezetimibe (ZETIA) 10 MG tablet Take 1 tablet (10 mg total) by mouth every morning.   ferrous sulfate 325 (65 FE) MG tablet Take 1 tablet (325 mg total) by mouth daily with breakfast.   furosemide (LASIX) 20 MG tablet Take 1 tablet (20 mg total) by mouth daily.   isosorbide mononitrate (IMDUR) 30 MG 24 hr tablet Take 0.5 tablets (15 mg total) by mouth daily.   levothyroxine (SYNTHROID) 75 MCG tablet TAKE ONE  TABLET ON AN EMPTY STOMACH WITHA GLASS OF WATER AT LEAST 30 TO 60 MINUTES BEFORE BREAKFAST   metoprolol tartrate (LOPRESSOR) 25 MG tablet Take 1 tablet (25 mg total) by mouth 2 (two) times daily.   Multiple Vitamins-Calcium (ONE-A-DAY WOMENS PO) Take by mouth daily.   pantoprazole (PROTONIX) 40  MG tablet Take 1 tablet (40 mg total) by mouth daily.   polyethylene glycol (MIRALAX / GLYCOLAX) 17 g packet Take 17 g by mouth daily as needed for moderate constipation.   No facility-administered encounter medications on file as of 07/01/2022.   Star Rating Drugs:  None ID  Care Gaps: Annual wellness visit in last year? Yes Hemoglobin A1C Diabetic Eye Exam  If Diabetic: Last eye exam / retinopathy screening: None ID Last diabetic foot exam: 12/04/2022  Adelene Idler, CPA/CMA Clinical Pharmacist Assistant Phone: [phone]

## 2022-07-03 ENCOUNTER — Ambulatory Visit: Payer: Medicare Other

## 2022-07-03 NOTE — Progress Notes (Unsigned)
Care Management & Coordination Services Pharmacy Note  07/03/2022 Name:  Tracy Silva MRN:  [id number] DOB:  01/14/1941  Summary: ***  Recommendations/Changes made from today's visit: ***  Follow up plan: ***   Patient-Specific Goals:  Subjective: Tracy Silva is an 82 y.o. year old female who is a primary patient of Simmons-Robinson, Tawanna Cooler, MD.  The care coordination team was consulted for assistance with disease management and care coordination needs.    {CCMTELEPHONEFACETOFACE:21091510} for {CCMINITIALFOLLOWUPCHOICE:21091511}.  Recent office visits: 12/03/21: Patient presented to Dr. Neita Garnet for follow-up.   Recent consult visits: 04/08/2022 [name] [facility] Cardiology) for Initial Consult - I am unable to view this note for any updates   03/04/2022 Ladean Raya, PA (Cardiology) for Follow-up- Defer ACE or ARB in light of CKD , Lab orders placed, Patient to follow-up in 3 months   01/16/2022 Colette Ribas, MD (Cardiothoracic Surgery) for Initial Consult- Stop metoprolol to tartrate. Begin metoprolol succinate 25 mg. You may take this half a tablet equal to 12 and half milligrams twice daily, Stop Plavix/clopidogrel. No follow-up noted   01/05/2022 Clarisa Kindred, FNP (CHF Clinic) for CHF- No medication changes noted, No orders placed, patient to follow-up in 6 weeks.    01/01/2022 Ladean Raya, PA (Cardiology) for Cardiothoracic Surgery- No medication changes noted, Referral to Dr. Romeo Apple for consideration of TAVR placed, Patient to follow-up in 2 months  Hospital visits: {Hospital DC Yes/No:25215}   Objective:  Lab Results  Component Value Date   CREATININE 2.21 (H) 12/30/2021   BUN 39 (H) 12/30/2021   EGFR 22 (L) 12/30/2021   GFRNONAA 28 (L) 12/23/2021   GFRAA 24 (L) 08/17/2019   NA 138 12/30/2021   K 4.2 12/30/2021   CALCIUM 9.6 12/30/2021   CO2 21 12/30/2021   GLUCOSE 107 (H) 12/30/2021    Lab Results  Component Value  Date/Time   HGBA1C 6.2 (H) 09/30/2021 09:30 AM   HGBA1C 6.0 (H) 09/09/2020 10:48 AM   MICROALBUR Negative 12/19/2019 02:55 PM   MICROALBUR 50 02/19/2016 03:47 PM    Last diabetic Eye exam: No results found for: "HMDIABEYEEXA"  Last diabetic Foot exam: No results found for: "HMDIABFOOTEX"   Lab Results  Component Value Date   CHOL 189 12/22/2021   HDL 68 12/22/2021   LDLCALC 105 (H) 12/22/2021   TRIG 79 12/22/2021   CHOLHDL 2.8 12/22/2021       Latest Ref Rng & Units 12/30/2021    9:29 AM 12/16/2021    4:19 AM 09/30/2021    9:30 AM  Hepatic Function  Total Protein 6.5 - 8.1 g/dL  7.1  6.4   Albumin 3.7 - 4.7 g/dL 4.4  3.9  4.2   AST 15 - 41 U/L  25  21   ALT 0 - 44 U/L  14  10   Alk Phosphatase 38 - 126 U/L  67  65   Total Bilirubin 0.3 - 1.2 mg/dL  0.7  0.2     Lab Results  Component Value Date/Time   TSH 18.474 (H) 12/16/2021 06:20 AM   TSH 7.260 (H) 09/30/2021 09:30 AM   TSH 6.480 (H) 09/09/2020 10:48 AM   FREET4 0.89 12/16/2021 11:17 AM       Latest Ref Rng & Units 12/30/2021    9:29 AM 12/23/2021    5:31 AM 12/22/2021    2:21 PM  CBC  WBC 3.4 - 10.8 x10E3/uL 5.7  4.3    Hemoglobin 11.1 - 15.9 g/dL  9.2  9.8  9.5   Hematocrit 34.0 - 46.6 % 28.4  30.1  28.0   Platelets 150 - 450 x10E3/uL 230  203      Lab Results  Component Value Date/Time   VITAMINB12 370 12/22/2021 04:34 AM    Clinical ASCVD: {YES/NO:21197} The ASCVD Risk score (Arnett DK, et al., 2019) failed to calculate for the following reasons:   The 2019 ASCVD risk score is only valid for ages 71 to 36   The patient has a prior MI or stroke diagnosis    ***Other: (CHADS2VASc if Afib, MMRC or CAT for COPD, ACT, DEXA)     12/26/2021    4:22 PM 11/03/2021    1:54 PM 09/05/2020   10:30 AM  Depression screen PHQ 2/9  Decreased Interest 0 1 0  Down, Depressed, Hopeless 0 0 0  PHQ - 2 Score 0 1 0  Altered sleeping 0  0  Tired, decreased energy 0  0  Change in appetite 0  0  Feeling bad or failure  about yourself  0  0  Trouble concentrating 0  0  Moving slowly or fidgety/restless 0  0  Suicidal thoughts 0  0  PHQ-9 Score 0  0  Difficult doing work/chores Not difficult at all  Not difficult at all     Social History   Tobacco Use  Smoking Status Former   Packs/day: 0.50   Years: 4.00   Additional pack years: 0.00   Total pack years: 2.00   Types: Cigarettes   Quit date: 01/26/1971   Years since quitting: 51.4  Smokeless Tobacco Never   BP Readings from Last 3 Encounters:  01/05/22 (!) 91/53  12/26/21 110/62  12/23/21 105/62   Pulse Readings from Last 3 Encounters:  01/05/22 87  12/26/21 85  12/23/21 73   Wt Readings from Last 3 Encounters:  01/05/22 140 lb 8 oz (63.7 kg)  12/26/21 139 lb (63 kg)  12/23/21 140 lb 6.9 oz (63.7 kg)   BMI Readings from Last 3 Encounters:  01/05/22 24.89 kg/m  12/26/21 24.62 kg/m  12/23/21 24.88 kg/m    Allergies  Allergen Reactions   Codeine Other (See Comments)    Headaches Heart palpations   Other     Wool and cashmere    Statins     Muscle pain, joint pain   Adhesive [Tape] Rash    FOR  LONG PERIODS OF TIME FOR  LONG PERIODS OF TIME    Medications Reviewed Today     Reviewed by Delma Freeze, FNP (Family Nurse Practitioner) on 01/05/22 at [phone]  Med List Status: <None>   Medication Order Taking? Sig Documenting Provider Last Dose Status Informant  aspirin 81 MG chewable tablet [id number] Yes Chew 1 tablet (81 mg total) by mouth daily. Alford Highland, MD Taking Active   clopidogrel (PLAVIX) 75 MG tablet [id number] Yes Take 1 tablet (75 mg total) by mouth daily. Alford Highland, MD Taking Active   ezetimibe (ZETIA) 10 MG tablet [id number] Yes TAKE 1 TABLET BY MOUTH EVERY MORNING Maple Hudson., MD Taking Active Self  ferrous sulfate 325 (65 FE) MG tablet [id number] Yes Take 1 tablet (325 mg total) by mouth daily with breakfast. Alford Highland, MD Taking Active   furosemide (LASIX) 20 MG tablet [id number]  Yes Take 1 tablet (20 mg total) by mouth daily. Alford Highland, MD Taking Active   isosorbide mononitrate (IMDUR) 30 MG 24 hr tablet [id number] Yes Take 0.5 tablets (15 mg  total) by mouth daily. Alford Highland, MD Taking Active   levothyroxine (SYNTHROID) 75 MCG tablet [id number] Yes TAKE ONE TABLET ON AN EMPTY STOMACH WITHA GLASS OF WATER AT LEAST 30 TO 60 MINUTES BEFORE BREAKFAST Maple Hudson., MD Taking Active Self  metoprolol tartrate (LOPRESSOR) 25 MG tablet [id number] Yes Take 1 tablet (25 mg total) by mouth 2 (two) times daily. Alford Highland, MD Taking Active   Multiple Vitamins-Calcium (ONE-A-DAY WOMENS PO) [id number] Yes Take by mouth daily. [provider] Taking Active Self  pantoprazole (PROTONIX) 40 MG tablet [id number] Yes Take 1 tablet (40 mg total) by mouth daily. Alford Highland, MD Taking Active   polyethylene glycol (MIRALAX / GLYCOLAX) 17 g packet [id number] Yes Take 17 g by mouth daily as needed for moderate constipation. Alford Highland, MD Taking Active             SDOH:  (Social Determinants of Health) assessments and interventions performed: {yes/no:20286} SDOH Interventions    Flowsheet Row Office Visit from 09/05/2020 in [facility]  SDOH Interventions   Depression Interventions/Treatment  PHQ2-9 Score <4 Follow-up Not Indicated       Medication Assistance: {MEDASSISTANCEINFO:25044}  Medication Access: Within the past 30 days, how often has patient missed a dose of medication? *** Is a pillbox or other method used to improve adherence? {YES/NO:21197} Factors that may affect medication adherence? {CHL DESC; BARRIERS:21522} Are meds synced by current pharmacy? {YES/NO:21197} Are meds delivered by current pharmacy? {YES/NO:21197} Does patient experience delays in picking up medications due to transportation concerns? {YES/NO:21197}  Compliance/Adherence/Medication fill history: Care Gaps: ***  Star-Rating  Drugs: ***   Assessment/Plan  Heart Failure (Goal: manage symptoms and prevent exacerbations) -{US controlled/uncontrolled:25276} -Last ejection fraction: 25-30% (Date: Jan 2024) -HF type: HFrEF (EF < 40%) -NYHA Class: I (no actitivty limitation) -AHA HF Stage: C (Heart disease and symptoms present) -Current treatment: Furosemide 20 mg daily  Imdur 15 mg daily  Metoprolol tartrate 25 mg twice daily  -Medications previously tried: *** \ -Arnold bread with decaf coffeee  -Not on ACEi or SGLT2 due to renal function -Current home BP/HR readings: *** -Current home daily weights: *** -Current dietary habits: *** -Current exercise habits: *** -Educated on {CCM HF Counseling:25125} -{CCMPHARMDINTERVENTION:25122}  Hyperlipidemia: (LDL goal < 55) -{US controlled/uncontrolled:25276} -History of CAD + Severe AS s/p TAVR Jan 2024.  -Current treatment: Ezetimibe 10 mg daily  -Current antiplatelet treatment: Aspirin 81 mg daily  Clopidogrel 75 mg daily  -Medications previously tried: Statins  -Current dietary patterns: *** -Current exercise habits: *** -Educated on {CCM HLD Counseling:25126} -{CCMPHARMDINTERVENTION:25122}  History of PE/DVT (Goal: ***) -{US controlled/uncontrolled:25276} -Current treatment  Eliquis 5 mg twice daily  -Medications previously tried: ***  -Unclear if patient will require indefinite antiocoagulation -{CCMPHARMDINTERVENTION:25122}  Chronic Kidney Disease Stage 4  -All medications assessed for renal dosing and appropriateness in chronic kidney disease. -{CCMPHARMDINTERVENTION:25122}   Follow Up Plan: {CM FOLLOW UP PLAN:22241}    ***

## 2022-07-03 NOTE — Patient Instructions (Addendum)
Visit Information It was great speaking with you today!  Please let me know if you have any questions about our visit.  Plan:  Continue current medications  Print copy of patient instructions, educational materials, and care plan provided in person.   Angelena Sole, PharmD, Patsy Baltimore, CPP  Clinical Pharmacist Practitioner  [facility]  Adelene Idler, CPA/CMA Clinical Pharmacist Assistant  Phone: [phone]

## 2022-07-27 ENCOUNTER — Telehealth: Payer: Self-pay | Admitting: Family Medicine

## 2022-07-27 NOTE — Telephone Encounter (Signed)
Contacted Einar Grad to schedule their annual wellness visit. Patient declined to schedule AWV at this time.  I spoke to patient and she said she's not interested and doesn't want to be called back about scheduling AWV.  Thank you,  [facility] Support [facility] Direct dial  [phone]

## 2022-08-19 ENCOUNTER — Other Ambulatory Visit: Payer: Self-pay | Admitting: Family Medicine

## 2022-08-19 DIAGNOSIS — E7849 Other hyperlipidemia: Secondary | ICD-10-CM

## 2022-08-25 ENCOUNTER — Other Ambulatory Visit: Payer: Self-pay | Admitting: Family Medicine

## 2022-09-07 DIAGNOSIS — I2510 Atherosclerotic heart disease of native coronary artery without angina pectoris: Secondary | ICD-10-CM | POA: Diagnosis not present

## 2022-09-07 DIAGNOSIS — Z952 Presence of prosthetic heart valve: Secondary | ICD-10-CM | POA: Diagnosis not present

## 2022-09-07 DIAGNOSIS — I447 Left bundle-branch block, unspecified: Secondary | ICD-10-CM | POA: Diagnosis not present

## 2022-09-07 DIAGNOSIS — I10 Essential (primary) hypertension: Secondary | ICD-10-CM | POA: Diagnosis not present

## 2022-09-07 DIAGNOSIS — I420 Dilated cardiomyopathy: Secondary | ICD-10-CM | POA: Diagnosis not present

## 2022-09-09 ENCOUNTER — Other Ambulatory Visit: Payer: Self-pay | Admitting: Family Medicine

## 2022-10-21 ENCOUNTER — Ambulatory Visit (INDEPENDENT_AMBULATORY_CARE_PROVIDER_SITE_OTHER): Payer: Medicare Other | Admitting: Family Medicine

## 2022-10-21 ENCOUNTER — Encounter: Payer: Self-pay | Admitting: Family Medicine

## 2022-10-21 VITALS — BP 127/73 | HR 79 | Temp 97.5°F | Ht 63.0 in | Wt 124.4 lb

## 2022-10-21 DIAGNOSIS — D509 Iron deficiency anemia, unspecified: Secondary | ICD-10-CM | POA: Diagnosis not present

## 2022-10-21 DIAGNOSIS — I350 Nonrheumatic aortic (valve) stenosis: Secondary | ICD-10-CM | POA: Diagnosis not present

## 2022-10-21 DIAGNOSIS — I5020 Unspecified systolic (congestive) heart failure: Secondary | ICD-10-CM | POA: Diagnosis not present

## 2022-10-21 DIAGNOSIS — Z0000 Encounter for general adult medical examination without abnormal findings: Secondary | ICD-10-CM | POA: Diagnosis not present

## 2022-10-21 DIAGNOSIS — N184 Chronic kidney disease, stage 4 (severe): Secondary | ICD-10-CM | POA: Diagnosis not present

## 2022-10-21 DIAGNOSIS — E559 Vitamin D deficiency, unspecified: Secondary | ICD-10-CM | POA: Diagnosis not present

## 2022-10-21 DIAGNOSIS — D518 Other vitamin B12 deficiency anemias: Secondary | ICD-10-CM

## 2022-10-21 DIAGNOSIS — I10 Essential (primary) hypertension: Secondary | ICD-10-CM | POA: Diagnosis not present

## 2022-10-21 DIAGNOSIS — E039 Hypothyroidism, unspecified: Secondary | ICD-10-CM | POA: Diagnosis not present

## 2022-10-21 DIAGNOSIS — E7800 Pure hypercholesterolemia, unspecified: Secondary | ICD-10-CM | POA: Diagnosis not present

## 2022-10-21 DIAGNOSIS — E119 Type 2 diabetes mellitus without complications: Secondary | ICD-10-CM | POA: Diagnosis not present

## 2022-10-21 NOTE — Assessment & Plan Note (Signed)
Recommended to patient and her daughter that they schedule her an eye exam soon. Diabetes currently weight and diet controlled.

## 2022-10-21 NOTE — Assessment & Plan Note (Signed)
No acute concerns today. Continue metoprolol succinate 12.5 mg daily, isosorbide mononitrate 30 mg daily, ezetimibe 10 mg daily and furosemide 20 mg daily

## 2022-10-21 NOTE — Assessment & Plan Note (Addendum)
Well-controlled Continue metoprolol succinate 12.5 mg daily and isosorbide mononitrate 30 mg daily.

## 2022-10-21 NOTE — Assessment & Plan Note (Signed)
No acute concerns Will recheck level today.

## 2022-10-21 NOTE — Patient Instructions (Addendum)
Check on most recent Tdap; tetanus vaccine was due in 2018. We also recommend you get the Shingrix vaccine (for shingles), even though you have already had shingles.

## 2022-10-21 NOTE — Progress Notes (Signed)
Complete physical exam   Patient: Tracy Silva   DOB: 08/20/1940   82 y.o. Female  MRN: [id number] Visit Date: 10/21/2022  Today's healthcare provider: Sherlyn Hay, DO   Subjective    Tracy Silva is a 82 y.o. female who presents today for a complete physical exam.  She reports consuming a low sodium and 1500 g Na  diet.  She walks for exercise most daily.  She generally feels well. She reports sleeping well. She does not have additional problems to discuss today.  HPI  Status/post hospitalization for TAVR January 2024 On Eliquis for PE and RLE DVT; planned for long-term therapy Followed up outpatient with EP for possible CRT-D - saw EP in March; currently no plan for CRT-D to be placed. Planned f/u with TAVR surgeon annually. Just saw routine cardiologist in August 2024.  F/u with hem/onc for colo/rectal cancer? In remission. No hem/onc f/u  - did a five year follow-up and will not need to continue follow-up.  Has lost 40 lbs since A1c was checked last year  Eye exam - due  Past Medical History:  Diagnosis Date   Acid reflux    AI (aortic incompetence) 09/07/2014   and stenosis murmur     Anemia    H/O AS A CHILD   Aortic stenosis    Arthritis    FINGERS AND SHOULDER-RIGHT   Asthma    AS A CHILD   CHF (congestive heart failure) (HCC)    Diabetes mellitus without complication (HCC)    H/O thyroid disease 09/07/2014   Heart murmur    ASYMPTOMATIC   High cholesterol    Hypertension    Hypothyroidism    NSTEMI (non-ST elevated myocardial infarction) (HCC) 12/21/2021   Other and unspecified noninfectious gastroenteritis and colitis(558.9) 06/27/2012   Rectal bleeding 2011   Rectal cancer (HCC) 07/16/2016   Past Surgical History:  Procedure Laterality Date   COLONOSCOPY  2011   X2   COLONOSCOPY N/A 07/27/2016   Procedure: COLONOSCOPY IN O.R;  Surgeon: Earline Mayotte, MD;  Location: [facility] ORS;  Service: General;  Laterality: N/A;   IUD REMOVAL      LAPAROSCOPIC PARTIAL COLECTOMY Left 08/21/2016   Procedure: LAPAROSCOPIC ASSISTED LEFT HEMI-COLECTOMY;  Surgeon: Earline Mayotte, MD;  Location: [facility] ORS;  Service: General;  Laterality: Left;   RECTAL EXAM UNDER ANESTHESIA N/A 07/27/2016   Procedure: RECTAL EXAM UNDER ANESTHESIA;  Surgeon: Earline Mayotte, MD;  Location: [facility] ORS;  Service: General;  Laterality: N/A;   RIGHT/LEFT HEART CATH AND CORONARY ANGIOGRAPHY N/A 12/22/2021   Procedure: RIGHT/LEFT HEART CATH AND CORONARY ANGIOGRAPHY;  Surgeon: Iran Ouch, MD;  Location: [facility] INVASIVE [facility];  Service: Cardiovascular;  Laterality: N/A;   TONSILLECTOMY     TOOTH EXTRACTION     TRANSANAL EXCISION OF RECTAL MASS  07/27/2016   Procedure: EXCISION OF RECTAL MASS;  Surgeon: Earline Mayotte, MD;  Location: [facility] ORS;  Service: General;;   WISDOM TOOTH EXTRACTION     Social History   Socioeconomic History   Marital status: Married    Spouse name: Not on file   Number of children: Not on file   Years of education: Not on file   Highest education level: Not on file  Occupational History   Not on file  Tobacco Use   Smoking status: Former    Current packs/day: 0.00    Average packs/day: 0.5 packs/day for 4.0 years (2.0 ttl pk-yrs)  Types: Cigarettes    Start date: 01/26/1967    Quit date: 01/26/1971    Years since quitting: 51.7   Smokeless tobacco: Never  Vaping Use   Vaping status: Never Used  Substance and Sexual Activity   Alcohol use: Yes    Comment: occasionally with dinner   Drug use: No   Sexual activity: Not on file  Other Topics Concern   Not on file  Social History Narrative   Not on file   Social Determinants of Health   Financial Resource Strain: Low Risk  (01/28/2022)   [facility], [facility]   Overall Financial Resource Strain (CARDIA)    Difficulty of Paying Living Expenses: Not hard at all  Food Insecurity: No Food Insecurity (01/28/2022)    [facility], [facility]   Hunger Vital Sign    Worried About Running Out of Food in the Last Year: Never true    Ran Out of Food in the Last Year: Never true  Transportation Needs: No Transportation Needs (01/29/2022)   [facility], [facility]   [facility] - Transportation    In the past 12 months, has lack of transportation kept you from medical appointments or from getting medications?: No    Lack of Transportation (Non-Medical): No  Physical Activity: Not on file  Stress: Not on file  Social Connections: Not on file  Intimate Partner Violence: Not At Risk (12/17/2021)   Humiliation, Afraid, Rape, and Kick questionnaire    Fear of Current or Ex-Partner: No    Emotionally Abused: No    Physically Abused: No    Sexually Abused: No   Family Status  Relation Name Status   Mother  Deceased at age 66       had enlarged heart also   Father  Deceased       due to alcohol related complications   Brother  Alive   Daughter  Alive   Emelda Brothers  (Not Specified)   Youth worker  (Not Specified)  No partnership data on file   Family History  Problem Relation Age of Onset   Pancreatic cancer Mother    Breast cancer Paternal Aunt    Cancer Maternal Aunt    Allergies  Allergen Reactions   Codeine Other (See Comments)    Headaches Heart palpations   Other     Wool and cashmere    Statins     Muscle pain, joint pain   Adhesive [Tape] Rash    FOR  LONG PERIODS OF TIME FOR  LONG PERIODS OF TIME    Patient Care Team: Erbie Arment N, DO as PCP - General (Family Medicine) Lemar Livings, Merrily Pew, MD (General Surgery) Marcina Millard, MD as Consulting Physician (Cardiology) Delma Freeze, FNP (Cardiology)   Medications: Outpatient Medications Prior to Visit  Medication Sig   ELIQUIS 5 MG TABS tablet TAKE ONE (1) TABLET BY MOUTH TWO TIMES PER DAY   ezetimibe (ZETIA) 10 MG tablet TAKE ONE  TABLET (10 MG) BY MOUTH EVERY MORNING   ferrous sulfate 325 (65 FE) MG tablet Take 1 tablet (325 mg total) by mouth daily with breakfast.   furosemide (LASIX) 20 MG tablet Take 1 tablet (20 mg total) by mouth daily.   isosorbide mononitrate (IMDUR) 30 MG 24 hr tablet Take 30 mg by mouth daily.   levothyroxine (SYNTHROID) 75 MCG tablet Take 1 tablet (75 mcg total)  by mouth daily before breakfast. Please schedule office visit before any future refills.   metoprolol succinate (TOPROL-XL) 25 MG 24 hr tablet Take 12.5 mg by mouth daily.   Multiple Vitamins-Calcium (ONE-A-DAY WOMENS PO) Take by mouth daily.   pantoprazole (PROTONIX) 40 MG tablet Take 1 tablet (40 mg total) by mouth daily.   No facility-administered medications prior to visit.    Review of Systems  Constitutional:  Negative for appetite change, chills, fatigue and fever.  Eyes:  Negative for visual disturbance.  Respiratory:  Negative for chest tightness and shortness of breath.   Cardiovascular:  Negative for chest pain and palpitations.  Gastrointestinal:  Negative for abdominal pain, nausea and vomiting.  Genitourinary:  Negative for dysuria, frequency and urgency.  Musculoskeletal:  Negative for arthralgias and back pain.  Neurological:  Negative for dizziness, weakness, light-headedness, numbness and headaches.      Objective    BP 127/73   Pulse 79   Temp (!) 97.5 F (36.4 C)   Ht 5\' 3"  (1.6 m)   Wt 124 lb 6.4 oz (56.4 kg)   SpO2 97%   BMI 22.04 kg/m    Physical Exam Vitals and nursing note reviewed.  Constitutional:      General: She is awake.     Appearance: Normal appearance.  HENT:     Head: Normocephalic and atraumatic.     Right Ear: Tympanic membrane, ear canal and external ear normal.     Left Ear: Tympanic membrane, ear canal and external ear normal.     Nose: Nose normal.     Mouth/Throat:     Mouth: Mucous membranes are moist.     Pharynx: Oropharynx is clear. No oropharyngeal exudate or  posterior oropharyngeal erythema.  Eyes:     General: No scleral icterus.    Extraocular Movements: Extraocular movements intact.     Conjunctiva/sclera: Conjunctivae normal.     Pupils: Pupils are equal, round, and reactive to light.  Neck:     Thyroid: No thyromegaly or thyroid tenderness.  Cardiovascular:     Rate and Rhythm: Normal rate and regular rhythm.     Pulses: Normal pulses.     Heart sounds: Normal heart sounds.  Pulmonary:     Effort: Pulmonary effort is normal. No tachypnea, bradypnea or respiratory distress.     Breath sounds: Normal breath sounds. No stridor. No wheezing, rhonchi or rales.  Abdominal:     General: Bowel sounds are normal. There is no distension.     Palpations: Abdomen is soft. There is no mass.     Tenderness: There is no abdominal tenderness. There is no guarding.     Hernia: No hernia is present.  Musculoskeletal:     Cervical back: Normal range of motion and neck supple.     Right lower leg: Edema (+1) present.     Left lower leg: Edema (+1) present.  Lymphadenopathy:     Cervical: No cervical adenopathy.  Skin:    General: Skin is warm and dry.     Comments: Scattered varicosities to BLE  Neurological:     Mental Status: She is alert and oriented to person, place, and time. Mental status is at baseline.  Psychiatric:        Mood and Affect: Mood normal.        Behavior: Behavior normal.      Last depression screening scores    10/21/2022   10:56 AM 12/26/2021    4:22 PM 11/03/2021  1:54 PM  PHQ 2/9 Scores  PHQ - 2 Score 0 0 1  PHQ- 9 Score 1 0    Last fall risk screening    10/21/2022   10:56 AM  Fall Risk   Falls in the past year? 0  Number falls in past yr: 0  Injury with Fall? 0   Last Audit-C alcohol use screening    10/21/2022   11:02 AM  Alcohol Use Disorder Test (AUDIT)  1. How often do you have a drink containing alcohol? 0  2. How many drinks containing alcohol do you have on a typical day when you are  drinking? 0  3. How often do you have six or more drinks on one occasion? 0  AUDIT-C Score 0   A score of 3 or more in women, and 4 or more in men indicates increased risk for alcohol abuse, EXCEPT if all of the points are from question 1   No results found for any visits on 10/21/22.  Assessment & Plan    Routine Health Maintenance and Physical Exam  Exercise Activities and Dietary recommendations  Goals   None     Immunization History  Administered Date(s) Administered   Fluad Quad(high Dose 65+) 11/07/2018, 11/13/2019, 12/11/2020   Influenza, High Dose Seasonal PF 09/27/2014, 11/01/2015, 11/25/2016, 11/03/2017   PFIZER(Purple Top)SARS-COV-2 Vaccination 12/12/2019   Pneumococcal Conjugate-13 06/05/2013   Pneumococcal Polysaccharide-23 12/23/2015   Td 12/01/2006    Health Maintenance  Topic Date Due   OPHTHALMOLOGY EXAM  Never done   Zoster Vaccines- Shingrix (1 of 2) Never done   DEXA SCAN  Never done   DTaP/Tdap/Td (2 - Tdap) 11/30/2016   HEMOGLOBIN A1C  03/31/2022   Medicare Annual Wellness (AWV)  11/20/2022 (Originally 09/24/2022)   INFLUENZA VACCINE  04/26/2023 (Originally 08/27/2022)   COVID-19 Vaccine (2 - Pfizer risk series) 04/26/2023 (Originally 01/02/2020)   Diabetic kidney evaluation - Urine ACR  12/04/2022   FOOT EXAM  12/04/2022   Diabetic kidney evaluation - eGFR measurement  12/31/2022   Pneumonia Vaccine 8+ Years old  Completed   HPV VACCINES  Aged Out    Discussed health benefits of physical activity, and encouraged her to engage in regular exercise appropriate for her age and condition.   Type 2 diabetes mellitus in remission [facility]) Assessment & Plan: Recommended to patient and her daughter that they schedule her an eye exam soon. Diabetes currently weight and diet controlled.  Orders: -     Microalbumin / creatinine urine ratio -     Hemoglobin A1c -     Lipid panel  Iron deficiency anemia, unspecified iron deficiency anemia type Assessment &  Plan: No acute concerns Will recheck level today.  Orders: -     CBC With Differential  Essential (primary) hypertension Assessment & Plan: Well-controlled Continue metoprolol succinate 12.5 mg daily and isosorbide mononitrate 30 mg daily.  Orders: -     Microalbumin / creatinine urine ratio -     Comprehensive metabolic panel -     Lipid panel  Heart failure with reduced ejection fraction (HCC) Assessment & Plan: No acute concerns today. Continue metoprolol succinate 12.5 mg daily, isosorbide mononitrate 30 mg daily, ezetimibe 10 mg daily and furosemide 20 mg daily   Hypothyroidism, unspecified type Assessment & Plan: Recheck level today. No signs or symptoms of concern. Continue levothyroxine 75 mcg daily.  Orders: -     TSH Rfx on Abnormal to Free T4  Vitamin D deficiency -  VITAMIN D 25 Hydroxy (Vit-D Deficiency, Fractures)  Severe aortic stenosis  Other vitamin B12 deficiency anemia -     Vitamin B12  Chronic kidney disease, stage 4 (severe) (HCC) Assessment & Plan: No acute concerns Will recheck level today.   Annual physical exam  Pure hypercholesterolemia Assessment & Plan: No acute concerns Continue ezetimibe 10 mg daily.     Return in about 1 year (around 10/21/2023).     I discussed the assessment and treatment plan with the patient  The patient was provided an opportunity to ask questions and all were answered. The patient agreed with the plan and demonstrated an understanding of the instructions.   The patient was advised to call back or seek an in-person evaluation if the symptoms worsen or if the condition fails to improve as anticipated.    Sherlyn Hay, DO  [facility] [phone] (phone) [phone] (fax)  [facility]

## 2022-10-21 NOTE — Assessment & Plan Note (Signed)
Recheck level today. No signs or symptoms of concern. Continue levothyroxine 75 mcg daily.

## 2022-10-21 NOTE — Assessment & Plan Note (Signed)
No acute concerns Continue ezetimibe 10 mg daily.

## 2022-10-26 DIAGNOSIS — E039 Hypothyroidism, unspecified: Secondary | ICD-10-CM | POA: Diagnosis not present

## 2022-10-26 DIAGNOSIS — I10 Essential (primary) hypertension: Secondary | ICD-10-CM | POA: Diagnosis not present

## 2022-10-26 DIAGNOSIS — E1122 Type 2 diabetes mellitus with diabetic chronic kidney disease: Secondary | ICD-10-CM | POA: Diagnosis not present

## 2022-10-26 DIAGNOSIS — N184 Chronic kidney disease, stage 4 (severe): Secondary | ICD-10-CM | POA: Diagnosis not present

## 2022-10-26 DIAGNOSIS — E559 Vitamin D deficiency, unspecified: Secondary | ICD-10-CM | POA: Diagnosis not present

## 2022-10-27 LAB — CBC WITH DIFFERENTIAL/PLATELET
Basophils Absolute: 0 10*3/uL (ref 0.0–0.2)
Basos: 1 %
EOS (ABSOLUTE): 0.3 10*3/uL (ref 0.0–0.4)
Eos: 7 %
Hematocrit: 31.3 % — ABNORMAL LOW (ref 34.0–46.6)
Hemoglobin: 10.3 g/dL — ABNORMAL LOW (ref 11.1–15.9)
Immature Grans (Abs): 0 10*3/uL (ref 0.0–0.1)
Immature Granulocytes: 0 %
Lymphocytes Absolute: 1.6 10*3/uL (ref 0.7–3.1)
Lymphs: 41 %
MCH: 33.4 pg — ABNORMAL HIGH (ref 26.6–33.0)
MCHC: 32.9 g/dL (ref 31.5–35.7)
MCV: 102 fL — ABNORMAL HIGH (ref 79–97)
Monocytes Absolute: 0.4 10*3/uL (ref 0.1–0.9)
Monocytes: 10 %
Neutrophils Absolute: 1.6 10*3/uL (ref 1.4–7.0)
Neutrophils: 41 %
Platelets: 159 10*3/uL (ref 150–450)
RBC: 3.08 x10E6/uL — ABNORMAL LOW (ref 3.77–5.28)
RDW: 12.8 % (ref 11.7–15.4)
WBC: 3.8 10*3/uL (ref 3.4–10.8)

## 2022-10-27 LAB — COMPREHENSIVE METABOLIC PANEL
ALT: 11 [IU]/L (ref 0–32)
AST: 19 [IU]/L (ref 0–40)
Albumin: 4.1 g/dL (ref 3.7–4.7)
Alkaline Phosphatase: 102 [IU]/L (ref 44–121)
BUN/Creatinine Ratio: 19 (ref 12–28)
BUN: 36 mg/dL — ABNORMAL HIGH (ref 8–27)
Bilirubin Total: 0.3 mg/dL (ref 0.0–1.2)
CO2: 24 mmol/L (ref 20–29)
Calcium: 9.5 mg/dL (ref 8.7–10.3)
Chloride: 107 mmol/L — ABNORMAL HIGH (ref 96–106)
Creatinine, Ser: 1.93 mg/dL — ABNORMAL HIGH (ref 0.57–1.00)
Globulin, Total: 2.4 g/dL (ref 1.5–4.5)
Glucose: 97 mg/dL (ref 70–99)
Potassium: 4.3 mmol/L (ref 3.5–5.2)
Sodium: 144 mmol/L (ref 134–144)
Total Protein: 6.5 g/dL (ref 6.0–8.5)
eGFR: 25 mL/min/{1.73_m2} — ABNORMAL LOW (ref >59)

## 2022-10-27 LAB — LIPID PANEL
Chol/HDL Ratio: 3.2 {ratio} (ref 0.0–4.4)
Cholesterol, Total: 208 mg/dL — ABNORMAL HIGH (ref 100–199)
HDL: 65 mg/dL (ref >39)
LDL Chol Calc (NIH): 126 mg/dL — ABNORMAL HIGH (ref 0–99)
Triglycerides: 96 mg/dL (ref 0–149)
VLDL Cholesterol Cal: 17 mg/dL (ref 5–40)

## 2022-10-27 LAB — MICROALBUMIN / CREATININE URINE RATIO
Creatinine, Urine: 71.2 mg/dL
Microalb/Creat Ratio: 14 mg/g{creat} (ref 0–29)
Microalbumin, Urine: 9.9 ug/mL

## 2022-10-27 LAB — VITAMIN B12: Vitamin B-12: 530 pg/mL (ref 232–1245)

## 2022-10-27 LAB — HEMOGLOBIN A1C
Est. average glucose Bld gHb Est-mCnc: 128 mg/dL
Hgb A1c MFr Bld: 6.1 % — ABNORMAL HIGH (ref 4.8–5.6)

## 2022-10-27 LAB — VITAMIN D 25 HYDROXY (VIT D DEFICIENCY, FRACTURES): Vit D, 25-Hydroxy: 44.1 ng/mL (ref 30.0–100.0)

## 2022-10-27 LAB — TSH RFX ON ABNORMAL TO FREE T4: TSH: 3.35 u[IU]/mL (ref 0.450–4.500)

## 2022-11-11 ENCOUNTER — Other Ambulatory Visit: Payer: Self-pay | Admitting: Family Medicine

## 2022-11-11 NOTE — Telephone Encounter (Signed)
Requested Prescriptions  Pending Prescriptions Disp Refills   apixaban (ELIQUIS) 5 MG TABS tablet [Pharmacy Med Name: ELIQUIS 5 MG TAB] 180 tablet 0    Sig: TAKE ONE (1) TABLET BY MOUTH TWO TIMES PER DAY     Hematology:  Anticoagulants - apixaban Failed - 11/11/2022  8:45 AM      Failed - HGB in normal range and within 360 days    Hemoglobin  Date Value Ref Range Status  10/26/2022 10.3 (L) 11.1 - 15.9 g/dL Final         Failed - HCT in normal range and within 360 days    Hematocrit  Date Value Ref Range Status  10/26/2022 31.3 (L) 34.0 - 46.6 % Final         Failed - Cr in normal range and within 360 days    Creat  Date Value Ref Range Status  11/30/2016 1.62 (H) 0.60 - 0.93 mg/dL Final    Comment:    For patients >53 years of age, the reference limit for Creatinine is approximately 13% higher for people identified as African-American. .    Creatinine, Ser  Date Value Ref Range Status  10/26/2022 1.93 (H) 0.57 - 1.00 mg/dL Final         Passed - PLT in normal range and within 360 days    Platelets  Date Value Ref Range Status  10/26/2022 159 150 - 450 x10E3/uL Final         Passed - AST in normal range and within 360 days    AST  Date Value Ref Range Status  10/26/2022 19 0 - 40 IU/L Final         Passed - ALT in normal range and within 360 days    ALT  Date Value Ref Range Status  10/26/2022 11 0 - 32 IU/L Final         Passed - Valid encounter within last 12 months    Recent Outpatient Visits           3 weeks ago Type 2 diabetes mellitus in remission [facility])   [place] [facility] [name], Monico Blitz, DO   10 months ago HFrEF (heart failure with reduced ejection fraction) [facility])   [place] Malva Limes, MD   11 months ago Essential (primary) hypertension   [place] [facility] Simmons-Robinson, [place], MD   1 year ago Acute non-recurrent sinusitis, unspecified location   [facility] Simmons-Robinson, [place], MD   1 year ago Medicare annual wellness visit, subsequent   [facility] Bosie Clos, MD       Future Appointments             In 11 months [name], Monico Blitz, DO [place] [facility], [facility]

## 2023-02-05 ENCOUNTER — Other Ambulatory Visit: Payer: Self-pay | Admitting: Family Medicine

## 2023-02-05 DIAGNOSIS — I2699 Other pulmonary embolism without acute cor pulmonale: Secondary | ICD-10-CM

## 2023-02-08 NOTE — Telephone Encounter (Signed)
 Requested medication (s) are due for refill today: yes  Requested medication (s) are on the active medication list: yes  Last refill:  11/11/22 #180  Future visit scheduled: yes  Notes to clinic:  lab work outside normal range   Requested Prescriptions  Pending Prescriptions Disp Refills   ELIQUIS  5 MG TABS tablet [Pharmacy Med Name: ELIQUIS  5 MG TAB] 180 tablet 0    Sig: TAKE ONE (1) TABLET BY MOUTH TWO TIMES PER DAY     Hematology:  Anticoagulants - apixaban  Failed - 02/08/2023  2:20 PM      Failed - HGB in normal range and within 360 days    Hemoglobin  Date Value Ref Range Status  10/26/2022 10.3 (L) 11.1 - 15.9 g/dL Final         Failed - HCT in normal range and within 360 days    Hematocrit  Date Value Ref Range Status  10/26/2022 31.3 (L) 34.0 - 46.6 % Final         Failed - Cr in normal range and within 360 days    Creat  Date Value Ref Range Status  11/30/2016 1.62 (H) 0.60 - 0.93 mg/dL Final    Comment:    For patients >75 years of age, the reference limit for Creatinine is approximately 13% higher for people identified as African-American. .    Creatinine, Ser  Date Value Ref Range Status  10/26/2022 1.93 (H) 0.57 - 1.00 mg/dL Final         Passed - PLT in normal range and within 360 days    Platelets  Date Value Ref Range Status  10/26/2022 159 150 - 450 x10E3/uL Final         Passed - AST in normal range and within 360 days    AST  Date Value Ref Range Status  10/26/2022 19 0 - 40 IU/L Final         Passed - ALT in normal range and within 360 days    ALT  Date Value Ref Range Status  10/26/2022 11 0 - 32 IU/L Final         Passed - Valid encounter within last 12 months    Recent Outpatient Visits           3 months ago Type 2 diabetes mellitus in remission [facility])   [place] [facility] [name], Lauraine SAILOR, DO   1 year ago HFrEF (heart failure with reduced ejection fraction) [facility])   [place] [facility]  [place] Nancyann BRAVO, MD   1 year ago Essential (primary) hypertension   [facility] Simmons-Robinson, [place], MD   1 year ago Acute non-recurrent sinusitis, unspecified location   [facility] Simmons-Robinson, [place], MD   1 year ago Medicare annual wellness visit, subsequent   [facility] [place] Charlie LITTIE Mickey., MD       Future Appointments             In 8 months [name], Lauraine SAILOR, DO [place] [facility], [facility]

## 2023-02-10 DIAGNOSIS — I10 Essential (primary) hypertension: Secondary | ICD-10-CM | POA: Diagnosis not present

## 2023-02-10 DIAGNOSIS — Z952 Presence of prosthetic heart valve: Secondary | ICD-10-CM | POA: Diagnosis not present

## 2023-02-10 DIAGNOSIS — I214 Non-ST elevation (NSTEMI) myocardial infarction: Secondary | ICD-10-CM | POA: Diagnosis not present

## 2023-02-10 DIAGNOSIS — I2510 Atherosclerotic heart disease of native coronary artery without angina pectoris: Secondary | ICD-10-CM | POA: Diagnosis not present

## 2023-02-10 DIAGNOSIS — I447 Left bundle-branch block, unspecified: Secondary | ICD-10-CM | POA: Diagnosis not present

## 2023-02-10 DIAGNOSIS — I420 Dilated cardiomyopathy: Secondary | ICD-10-CM | POA: Diagnosis not present

## 2023-03-25 DIAGNOSIS — Z952 Presence of prosthetic heart valve: Secondary | ICD-10-CM | POA: Diagnosis not present

## 2023-03-25 DIAGNOSIS — I10 Essential (primary) hypertension: Secondary | ICD-10-CM | POA: Diagnosis not present

## 2023-04-19 ENCOUNTER — Encounter: Payer: Self-pay | Admitting: Family Medicine

## 2023-05-06 ENCOUNTER — Other Ambulatory Visit: Payer: Self-pay | Admitting: Family Medicine

## 2023-05-06 DIAGNOSIS — I2699 Other pulmonary embolism without acute cor pulmonale: Secondary | ICD-10-CM

## 2023-05-11 ENCOUNTER — Other Ambulatory Visit: Payer: Self-pay | Admitting: Family Medicine

## 2023-05-11 DIAGNOSIS — E7849 Other hyperlipidemia: Secondary | ICD-10-CM

## 2023-05-18 ENCOUNTER — Other Ambulatory Visit: Payer: Self-pay | Admitting: Family Medicine

## 2023-05-19 NOTE — Telephone Encounter (Signed)
 Requested medication (s) are due for refill today:   Not sure  Requested medication (s) are on the active medication list:   No  Future visit scheduled:   Yes 9/25 with Dr. Athena Bland.     LOV 10/21/2022 with Dr. Athena Bland.   Last ordered: Not on medication list.  Unable to refill because this is not on her medication list.   It looks like it may have expired on 02/19/2023 and was prescribed by her cardiologist Dr. Aram Knights with [facility].      Requested Prescriptions  Pending Prescriptions Disp Refills   metoprolol  succinate (TOPROL -XL) 25 MG 24 hr tablet [Pharmacy Med Name: METOPROLOL  SUCCINATE ER 25 MG TAB] 15 tablet     Sig: TAKE ONE-HALF (1/2) TABLET (12.5 MG) BY MOUTH EVERY DAY     Cardiovascular:  Beta Blockers Failed - 05/19/2023  9:38 AM      Failed - Valid encounter within last 6 months    Recent Outpatient Visits   None     Future Appointments             In 5 months [name], [name], DO [place] [facility]            Passed - Last BP in normal range    BP Readings from Last 1 Encounters:  10/21/22 127/73         Passed - Last Heart Rate in normal range    Pulse Readings from Last 1 Encounters:  10/21/22 79

## 2023-05-19 NOTE — Telephone Encounter (Signed)
 Requested Prescriptions  Pending Prescriptions Disp Refills   levothyroxine  (SYNTHROID ) 75 MCG tablet [Pharmacy Med Name: LEVOTHYROXINE  SODIUM 75 MCG TAB] 30 tablet 4    Sig: TAKE ONE TABLET ON AN EMPTY STOMACH WITHA GLASS OF WATER AT LEAST 30 TO 60 MINUTES BEFORE BREAKFAST     Endocrinology:  Hypothyroid Agents Failed - 05/19/2023  9:33 AM      Failed - Valid encounter within last 12 months    Recent Outpatient Visits   None     Future Appointments             In 5 months [name], [name], DO [place] [facility]            Passed - TSH in normal range and within 360 days    TSH  Date Value Ref Range Status  10/26/2022 3.350 0.450 - 4.500 uIU/mL Final  12/16/2021 18.474 (H) 0.350 - 4.500 uIU/mL Final    Comment:    Performed by a 3rd Generation assay with a functional sensitivity of <=0.01 uIU/mL. Performed at [facility], [address] [place], [place] [address]   09/30/2021 7.260 (H) 0.450 - 4.500 uIU/mL Final

## 2023-06-16 DIAGNOSIS — D485 Neoplasm of uncertain behavior of skin: Secondary | ICD-10-CM | POA: Diagnosis not present

## 2023-06-16 DIAGNOSIS — C44319 Basal cell carcinoma of skin of other parts of face: Secondary | ICD-10-CM | POA: Diagnosis not present

## 2023-06-16 DIAGNOSIS — L821 Other seborrheic keratosis: Secondary | ICD-10-CM | POA: Diagnosis not present

## 2023-06-16 DIAGNOSIS — D235 Other benign neoplasm of skin of trunk: Secondary | ICD-10-CM | POA: Diagnosis not present

## 2023-06-29 DIAGNOSIS — I447 Left bundle-branch block, unspecified: Secondary | ICD-10-CM | POA: Diagnosis not present

## 2023-06-29 DIAGNOSIS — I2699 Other pulmonary embolism without acute cor pulmonale: Secondary | ICD-10-CM | POA: Diagnosis not present

## 2023-06-29 DIAGNOSIS — Z952 Presence of prosthetic heart valve: Secondary | ICD-10-CM | POA: Diagnosis not present

## 2023-06-29 DIAGNOSIS — I2510 Atherosclerotic heart disease of native coronary artery without angina pectoris: Secondary | ICD-10-CM | POA: Diagnosis not present

## 2023-06-29 DIAGNOSIS — E7800 Pure hypercholesterolemia, unspecified: Secondary | ICD-10-CM | POA: Diagnosis not present

## 2023-06-29 DIAGNOSIS — I214 Non-ST elevation (NSTEMI) myocardial infarction: Secondary | ICD-10-CM | POA: Diagnosis not present

## 2023-06-29 DIAGNOSIS — I10 Essential (primary) hypertension: Secondary | ICD-10-CM | POA: Diagnosis not present

## 2023-06-29 DIAGNOSIS — I420 Dilated cardiomyopathy: Secondary | ICD-10-CM | POA: Diagnosis not present

## 2023-07-01 DIAGNOSIS — C44319 Basal cell carcinoma of skin of other parts of face: Secondary | ICD-10-CM | POA: Diagnosis not present

## 2023-07-15 ENCOUNTER — Other Ambulatory Visit: Payer: Self-pay | Admitting: Family Medicine

## 2023-08-11 DIAGNOSIS — D225 Melanocytic nevi of trunk: Secondary | ICD-10-CM | POA: Diagnosis not present

## 2023-09-24 ENCOUNTER — Other Ambulatory Visit: Payer: Self-pay | Admitting: Family Medicine

## 2023-10-21 ENCOUNTER — Encounter: Payer: Self-pay | Admitting: Family Medicine

## 2023-10-21 ENCOUNTER — Ambulatory Visit (INDEPENDENT_AMBULATORY_CARE_PROVIDER_SITE_OTHER): Payer: Self-pay | Admitting: Family Medicine

## 2023-10-21 VITALS — BP 133/77 | HR 66 | Temp 97.7°F | Ht 63.0 in | Wt 130.0 lb

## 2023-10-21 DIAGNOSIS — I5020 Unspecified systolic (congestive) heart failure: Secondary | ICD-10-CM | POA: Diagnosis not present

## 2023-10-21 DIAGNOSIS — I10 Essential (primary) hypertension: Secondary | ICD-10-CM | POA: Diagnosis not present

## 2023-10-21 DIAGNOSIS — K219 Gastro-esophageal reflux disease without esophagitis: Secondary | ICD-10-CM | POA: Diagnosis not present

## 2023-10-21 DIAGNOSIS — N184 Chronic kidney disease, stage 4 (severe): Secondary | ICD-10-CM | POA: Diagnosis not present

## 2023-10-21 DIAGNOSIS — E039 Hypothyroidism, unspecified: Secondary | ICD-10-CM

## 2023-10-21 DIAGNOSIS — E1122 Type 2 diabetes mellitus with diabetic chronic kidney disease: Secondary | ICD-10-CM

## 2023-10-21 DIAGNOSIS — I739 Peripheral vascular disease, unspecified: Secondary | ICD-10-CM | POA: Diagnosis not present

## 2023-10-21 DIAGNOSIS — Z952 Presence of prosthetic heart valve: Secondary | ICD-10-CM

## 2023-10-21 DIAGNOSIS — E7800 Pure hypercholesterolemia, unspecified: Secondary | ICD-10-CM

## 2023-10-21 DIAGNOSIS — Z79899 Other long term (current) drug therapy: Secondary | ICD-10-CM

## 2023-10-21 DIAGNOSIS — E559 Vitamin D deficiency, unspecified: Secondary | ICD-10-CM

## 2023-10-21 DIAGNOSIS — Z0000 Encounter for general adult medical examination without abnormal findings: Secondary | ICD-10-CM

## 2023-10-21 DIAGNOSIS — Z0001 Encounter for general adult medical examination with abnormal findings: Secondary | ICD-10-CM

## 2023-10-21 DIAGNOSIS — D649 Anemia, unspecified: Secondary | ICD-10-CM | POA: Diagnosis not present

## 2023-10-21 NOTE — Progress Notes (Signed)
 Complete physical exam   Patient: Tracy Silva   DOB: [birth year]   83 y.o. Female  MRN: [id number] Visit Date: 10/21/2023  Today's healthcare provider: LAURAINE LOISE BUOY, DO   Chief Complaint  Patient presents with   Annual Exam    Patient feels well, eating well, sleeping well and walks 3-4 times per week 30-45 minutes at the time.  Patient is due for diabetic eye exam, foot exam, DEXA scan and diabetic labs.    Subjective    Tracy Silva is a 83 y.o. female who presents today for a complete physical exam.   HPI HPI     Annual Exam    Additional comments: Patient feels well, eating well, sleeping well and walks 3-4 times per week 30-45 minutes at the time.  Patient is due for diabetic eye exam, foot exam, DEXA scan and diabetic labs.       Last edited by Desanto, Elena D, CMA on 10/21/2023  9:04 AM.       Tracy Silva is an 83 year old female who presents for an annual physical exam. She is accompanied by her daughter, Randine.  She has a history of skin cancer treated with Mohs surgery on her back and another unspecified location. She also has a history of shingles, for which she took medication, and anemia, for which she is on iron supplementation.  She is currently taking several medications including ezetimibe , Eliquis , Protonix , a women's multivitamin (Centrum Silver), metoprolol  (half tablet), levothyroxine , Imdur  (isosorbide  mononitrate), and Lasix  as needed for swelling. She notes that she was previously taking Lasix  regularly but experienced dizziness, so it was adjusted to be taken only when needed.  She has a history of mild, diet-controlled diabetes and is not on medication for it. She has been cautious about her sugar intake, especially since her husband had diabetes. She drinks water regularly and has been mindful of her diet.  Occasional sinus issues noted. No changes in hearing or appetite, although she notes her appetite is 'too good'.  Her husband  had diabetes and kidney issues, which has influenced her cautious approach to sugar intake and diet. She mentions that her husband passed away on Easter this year, which has been a difficult adjustment for her.     Past Medical History:  Diagnosis Date   Acid reflux    AI (aortic incompetence) 09/07/2014   and stenosis murmur     Anemia    H/O AS A CHILD   Aortic stenosis    Arthritis    FINGERS AND SHOULDER-RIGHT   Asthma    AS A CHILD   CHF (congestive heart failure) (HCC)    Diabetes mellitus without complication (HCC)    H/O thyroid  disease 09/07/2014   Heart murmur    ASYMPTOMATIC   High cholesterol    Hypertension    Hypothyroidism    NSTEMI (non-ST elevated myocardial infarction) (HCC) 12/21/2021   Other and unspecified noninfectious gastroenteritis and colitis(558.9) 06/27/2012   Rectal bleeding 2011   Rectal cancer (HCC) 07/16/2016   Past Surgical History:  Procedure Laterality Date   COLONOSCOPY  2011   X2   COLONOSCOPY N/A 07/27/2016   Procedure: COLONOSCOPY IN O.R;  Surgeon: Dessa Reyes ORN, MD;  Location: [facility];  Service: General;  Laterality: N/A;   IUD REMOVAL     LAPAROSCOPIC PARTIAL COLECTOMY Left 08/21/2016   Procedure: LAPAROSCOPIC ASSISTED LEFT HEMI-COLECTOMY;  Surgeon: Dessa Reyes ORN, MD;  Location: [facility];  Service: General;  Laterality: Left;   RECTAL EXAM UNDER ANESTHESIA N/A 07/27/2016   Procedure: RECTAL EXAM UNDER ANESTHESIA;  Surgeon: Dessa Reyes ORN, MD;  Location: [facility];  Service: General;  Laterality: N/A;   RIGHT/LEFT HEART CATH AND CORONARY ANGIOGRAPHY N/A 12/22/2021   Procedure: RIGHT/LEFT HEART CATH AND CORONARY ANGIOGRAPHY;  Surgeon: Darron Deatrice LABOR, MD;  Location: [facility];  Service: Cardiovascular;  Laterality: N/A;   TONSILLECTOMY     TOOTH EXTRACTION     TRANSANAL EXCISION OF RECTAL MASS  07/27/2016   Procedure: EXCISION OF RECTAL MASS;  Surgeon: Dessa Reyes ORN, MD;  Location: [facility];  Service:  General;;   WISDOM TOOTH EXTRACTION     Social History   Socioeconomic History   Marital status: Married    Spouse name: Not on file   Number of children: Not on file   Years of education: Not on file   Highest education level: Not on file  Occupational History   Not on file  Tobacco Use   Smoking status: Former    Current packs/day: 0.00    Average packs/day: 0.5 packs/day for 4.0 years (2.0 ttl pk-yrs)    Types: Cigarettes    Start date: 01/26/1967    Quit date: 01/26/1971    Years since quitting: 52.7   Smokeless tobacco: Never  Vaping Use   Vaping status: Never Used  Substance and Sexual Activity   Alcohol  use: Yes    Comment: occasionally with dinner   Drug use: No   Sexual activity: Not on file  Other Topics Concern   Not on file  Social History Narrative   Not on file   Social Drivers of Health   Financial Resource Strain: Low Risk  (01/28/2022)   Received [facility]   Overall Financial Resource Strain (CARDIA)    Difficulty of Paying Living Expenses: Not hard at all  Food Insecurity: No Food Insecurity (01/28/2022)   Received from [facility]   Hunger Vital Sign    Within the past 12 months, you worried that your food would run out before you got the money to buy more.: Never true    Within the past 12 months, the food you bought just didn't last and you didn't have money to get more.: Never true  Transportation Needs: No Transportation Needs (01/29/2022)   Received from [facility]    In the past 12 months, has lack of transportation kept you from medical appointments or from getting medications?: No    Lack of Transportation (Non-Medical): No  Physical Activity: Not on file  Stress: Not on file  Social Connections: Not on file  Intimate Partner Violence: Not At Risk (12/17/2021)   Humiliation, Afraid, Rape, and Kick questionnaire    Fear of Current or Ex-Partner: No     Emotionally Abused: No    Physically Abused: No    Sexually Abused: No   Family Status  Relation Name Status   Mother  Deceased at age 45       had enlarged heart also   Father  Deceased       due to alcohol  related complications   Brother  Alive   Daughter  Alive   Bruna Nyhan  (Not Specified)   Mat Aunt  (Not Specified)  No partnership data on file   Family History  Problem Relation Age of Onset   Pancreatic cancer Mother  Breast cancer Paternal Aunt    Cancer Maternal Aunt    Allergies  Allergen Reactions   Codeine Other (See Comments)    Headaches Heart palpations   Other     Wool and cashmere    Statins     Muscle pain, joint pain   Adhesive [Tape] Rash    FOR  LONG PERIODS OF TIME FOR  LONG PERIODS OF TIME    Patient Care Team: Jeremaine Maraj N, DO as PCP - General (Family Medicine) Dessa, Reyes ORN, MD (General Surgery) Ammon Blunt, MD as Consulting Physician (Cardiology) Donette Ellouise LABOR, FNP (Cardiology)   Medications: Outpatient Medications Prior to Visit  Medication Sig   ELIQUIS  5 MG TABS tablet TAKE ONE (1) TABLET BY MOUTH TWO TIMES PER DAY   ezetimibe  (ZETIA ) 10 MG tablet TAKE ONE TABLET (10 MG) BY MOUTH EVERY MORNING   ferrous sulfate  325 (65 FE) MG tablet Take 1 tablet (325 mg total) by mouth daily with breakfast.   furosemide  (LASIX ) 20 MG tablet Take 1 tablet (20 mg total) by mouth daily.   isosorbide  mononitrate (IMDUR ) 30 MG 24 hr tablet Take 30 mg by mouth daily.   levothyroxine  (SYNTHROID ) 75 MCG tablet TAKE ONE TABLET ON AN EMPTY STOMACH WITHA GLASS OF WATER AT LEAST 30 TO 60 MINUTES BEFORE BREAKFAST   metoprolol  succinate (TOPROL -XL) 25 MG 24 hr tablet TAKE 1/2 TABLET BY MOUTH ONCE DAILY   Multiple Vitamins-Calcium (ONE-A-DAY WOMENS PO) Take by mouth daily.   pantoprazole  (PROTONIX ) 40 MG tablet Take 1 tablet (40 mg total) by mouth daily.   No facility-administered medications prior to visit.    Review of Systems   Constitutional:  Negative for chills, fatigue and fever.  HENT:  Negative for congestion, ear pain, rhinorrhea, sneezing and sore throat.   Eyes: Negative.  Negative for pain and redness.  Respiratory:  Negative for cough, shortness of breath and wheezing.   Cardiovascular:  Negative for chest pain and leg swelling.  Gastrointestinal:  Negative for abdominal pain, blood in stool, constipation, diarrhea and nausea.  Endocrine: Negative for polydipsia and polyphagia.  Genitourinary: Negative.  Negative for dysuria, flank pain, hematuria, pelvic pain, vaginal bleeding and vaginal discharge.  Musculoskeletal:  Negative for arthralgias, back pain, gait problem and joint swelling.  Skin:  Negative for rash.  Neurological: Negative.  Negative for dizziness, tremors, seizures, weakness, light-headedness, numbness and headaches.  Hematological:  Negative for adenopathy.  Psychiatric/Behavioral: Negative.  Negative for behavioral problems, confusion and dysphoric mood. The patient is not nervous/anxious and is not hyperactive.       Objective    BP 133/77 (BP Location: Left Arm, Patient Position: Sitting, Cuff Size: Normal)   Pulse 66   Temp 97.7 F (36.5 C) (Oral)   Ht 5' 3 (1.6 m)   Wt 130 lb (59 kg)   SpO2 100%   BMI 23.03 kg/m    Physical Exam Vitals and nursing note reviewed.  Constitutional:      General: She is awake.     Appearance: Normal appearance.  HENT:     Head: Normocephalic and atraumatic.     Right Ear: Tympanic membrane, ear canal and external ear normal.     Left Ear: Tympanic membrane, ear canal and external ear normal.     Nose: Nose normal.     Mouth/Throat:     Mouth: Mucous membranes are moist.     Pharynx: Oropharynx is clear. No oropharyngeal exudate or posterior oropharyngeal erythema.  Eyes:  General: No scleral icterus.    Extraocular Movements: Extraocular movements intact.     Conjunctiva/sclera: Conjunctivae normal.     Pupils: Pupils are  equal, round, and reactive to light.  Neck:     Thyroid : No thyromegaly or thyroid  tenderness.  Cardiovascular:     Rate and Rhythm: Normal rate and regular rhythm.     Pulses: Normal pulses.     Heart sounds: Murmur (pt s/p TAVR) heard.  Pulmonary:     Effort: Pulmonary effort is normal. No tachypnea, bradypnea or respiratory distress.     Breath sounds: Normal breath sounds. No stridor. No wheezing, rhonchi or rales.  Abdominal:     General: Bowel sounds are normal. There is no distension.     Palpations: Abdomen is soft. There is no mass.     Tenderness: There is no abdominal tenderness. There is no guarding.     Hernia: No hernia is present.  Musculoskeletal:     Cervical back: Normal range of motion and neck supple.     Right lower leg: No edema.     Left lower leg: No edema.  Lymphadenopathy:     Cervical: No cervical adenopathy.  Skin:    General: Skin is warm and dry.  Neurological:     Mental Status: She is alert and oriented to person, place, and time. Mental status is at baseline.  Psychiatric:        Mood and Affect: Mood normal.        Behavior: Behavior normal.      Last depression screening scores    10/21/2023    9:08 AM 10/21/2022   10:56 AM 12/26/2021    4:22 PM  PHQ 2/9 Scores  PHQ - 2 Score 0 0 0  PHQ- 9 Score 0 1 0   Last fall risk screening    10/21/2023    9:08 AM  Fall Risk   Falls in the past year? 0  Number falls in past yr: 0  Injury with Fall? 0   Last Audit-C alcohol  use screening    10/21/2022   11:02 AM  Alcohol  Use Disorder Test (AUDIT)  1. How often do you have a drink containing alcohol ? 0  2. How many drinks containing alcohol  do you have on a typical day when you are drinking? 0  3. How often do you have six or more drinks on one occasion? 0  AUDIT-C Score 0   A score of 3 or more in women, and 4 or more in men indicates increased risk for alcohol  abuse, EXCEPT if all of the points are from question 1   No results found for  any visits on 10/21/23.  Assessment & Plan    Routine Health Maintenance and Physical Exam  Exercise Activities and Dietary recommendations  Goals   None     Immunization History  Administered Date(s) Administered   Fluad Quad(high Dose 65+) 11/07/2018, 11/13/2019, 12/11/2020   INFLUENZA, HIGH DOSE SEASONAL PF 09/27/2014, 11/01/2015, 11/25/2016, 11/03/2017   PFIZER(Purple Top)SARS-COV-2 Vaccination 12/12/2019   Pneumococcal Conjugate-13 06/05/2013   Pneumococcal Polysaccharide-23 12/23/2015   Td 12/01/2006    Health Maintenance  Topic Date Due   OPHTHALMOLOGY EXAM  Never done   DTaP/Tdap/Td (2 - Tdap) 11/30/2016   Medicare Annual Wellness (AWV)  09/24/2022   HEMOGLOBIN A1C  04/25/2023   Diabetic kidney evaluation - eGFR measurement  10/26/2023   Diabetic kidney evaluation - Urine ACR  10/26/2023   Influenza Vaccine  04/25/2024 (Originally 08/27/2023)  COVID-19 Vaccine (4 - 2025-26 season) 10/19/2024 (Originally 09/27/2023)   DEXA SCAN  10/20/2024 (Originally 07/17/2003)   Zoster Vaccines- Shingrix (1 of 2) 10/20/2024 (Originally 07/16/1959)   FOOT EXAM  10/20/2024   Pneumococcal Vaccine: 50+ Years  Completed   HPV VACCINES  Aged Out   Meningococcal B Vaccine  Aged Out    Discussed health benefits of physical activity, and encouraged her to engage in regular exercise appropriate for her age and condition.   Annual physical exam -     Microalbumin / creatinine urine ratio  Heart failure with reduced ejection fraction (HCC)  S/P TAVR (transcatheter aortic valve replacement)  Type 2 diabetes mellitus with stage 4 chronic kidney disease, without long-term current use of insulin  (HCC) -     Hemoglobin A1c -     Comprehensive metabolic panel with GFR  Hypothyroidism, unspecified type -     TSH  Normocytic anemia -     CBC  Essential (primary) hypertension  Gastroesophageal reflux disease without esophagitis -     Vitamin B12  High risk medication use -     Vitamin  B12  PAD (peripheral artery disease)  Pure hypercholesterolemia  Vitamin D  deficiency -     VITAMIN D  25 Hydroxy (Vit-D Deficiency, Fractures)     Annual physical exam Routine adult wellness visit with no specific concerns. Physical exam overall unremarkable except as noted above. Routine lab work ordered as noted.   - Perform physical examination. - Order blood work including metabolic panel, cholesterol panel, vitamin D , B12, blood count, and thyroid  level. - Schedule Medicare annual wellness visit.  Heart failure with reduced ejection fraction; status post TAVR (transcatheter aortic valve replacement) No new symptoms reported.  On Eliquis  5 mg twice daily, metoprolol  12.5 mg daily, isosorbide  mononitrate 30 mg daily and Lasix  20 mg daily as needed. - Continue current medications without changes. - Follows with cardiology; defer to specialist management  Type 2 diabetes mellitus with stage 4 chronic kidney disease, without long-term current use of insulin  Mild type 2 diabetes mellitus with slightly elevated blood sugar levels not necessitating medication. - Discuss importance of regular monitoring of blood sugar levels. - Continue low carbohydrate diet  Hypothyroidism, unspecified Hypothyroidism managed with levothyroxine . No new symptoms reported. - Continue levothyroxine  75 mcg daily. - Order thyroid  level as part of blood work.  Normocytic anemia Normocytic anemia; check complete blood count today to monitor status. - Order complete blood count  Essential hypertension Chronic, stable.  No changes. No acute concerns.  Continue to monitor.   History of skin cancer, status post Mohs surgery Skin cancer treated with Mohs surgery. No acute concerns.  Continue to monitor.  Defer to specialist management.    Return in about 3 months (around 01/20/2024) for mAWV with AWV nurse, and in 6 months for chronic f/u.     I discussed the assessment and treatment plan with the  patient  The patient was provided an opportunity to ask questions and all were answered. The patient agreed with the plan and demonstrated an understanding of the instructions.   The patient was advised to call back or seek an in-person evaluation if the symptoms worsen or if the condition fails to improve as anticipated.    LAURAINE LOISE BUOY, DO  [facility] [phone] (phone) [phone] (fax)  [facility]

## 2023-10-21 NOTE — Patient Instructions (Signed)
 Recommended vaccines: Tdap (tetanus, diphtheria and pertussis).

## 2023-10-22 LAB — COMPREHENSIVE METABOLIC PANEL WITH GFR
ALT: 8 IU/L (ref 0–32)
AST: 20 IU/L (ref 0–40)
Albumin: 4.3 g/dL (ref 3.7–4.7)
Alkaline Phosphatase: 84 IU/L (ref 48–129)
BUN/Creatinine Ratio: 17 (ref 12–28)
BUN: 29 mg/dL — ABNORMAL HIGH (ref 8–27)
Bilirubin Total: 0.4 mg/dL (ref 0.0–1.2)
CO2: 20 mmol/L (ref 20–29)
Calcium: 9.6 mg/dL (ref 8.7–10.3)
Chloride: 103 mmol/L (ref 96–106)
Creatinine, Ser: 1.73 mg/dL — ABNORMAL HIGH (ref 0.57–1.00)
Globulin, Total: 2.3 g/dL (ref 1.5–4.5)
Glucose: 96 mg/dL (ref 70–99)
Potassium: 4.4 mmol/L (ref 3.5–5.2)
Sodium: 139 mmol/L (ref 134–144)
Total Protein: 6.6 g/dL (ref 6.0–8.5)
eGFR: 29 mL/min/1.73 — ABNORMAL LOW (ref >59)

## 2023-10-22 LAB — TSH: TSH: 3.46 u[IU]/mL (ref 0.450–4.500)

## 2023-10-22 LAB — CBC
Hematocrit: 32.8 % — ABNORMAL LOW (ref 34.0–46.6)
Hemoglobin: 10.8 g/dL — ABNORMAL LOW (ref 11.1–15.9)
MCH: 33.3 pg — ABNORMAL HIGH (ref 26.6–33.0)
MCHC: 32.9 g/dL (ref 31.5–35.7)
MCV: 101 fL — ABNORMAL HIGH (ref 79–97)
Platelets: 162 x10E3/uL (ref 150–450)
RBC: 3.24 x10E6/uL — ABNORMAL LOW (ref 3.77–5.28)
RDW: 13.8 % (ref 11.7–15.4)
WBC: 3.5 x10E3/uL (ref 3.4–10.8)

## 2023-10-22 LAB — MICROALBUMIN / CREATININE URINE RATIO
Creatinine, Urine: 35.3 mg/dL
Microalb/Creat Ratio: 61 mg/g{creat} — ABNORMAL HIGH (ref 0–29)
Microalbumin, Urine: 21.4 ug/mL

## 2023-10-22 LAB — HEMOGLOBIN A1C
Est. average glucose Bld gHb Est-mCnc: 126 mg/dL
Hgb A1c MFr Bld: 6 % — ABNORMAL HIGH (ref 4.8–5.6)

## 2023-10-22 LAB — VITAMIN D 25 HYDROXY (VIT D DEFICIENCY, FRACTURES): Vit D, 25-Hydroxy: 33.7 ng/mL (ref 30.0–100.0)

## 2023-10-22 LAB — VITAMIN B12: Vitamin B-12: 558 pg/mL (ref 232–1245)

## 2023-10-23 ENCOUNTER — Other Ambulatory Visit: Payer: Self-pay | Admitting: Family Medicine

## 2023-10-23 DIAGNOSIS — E7849 Other hyperlipidemia: Secondary | ICD-10-CM

## 2023-10-29 ENCOUNTER — Other Ambulatory Visit: Payer: Self-pay | Admitting: Family Medicine

## 2023-10-29 DIAGNOSIS — E7849 Other hyperlipidemia: Secondary | ICD-10-CM

## 2023-10-29 NOTE — Telephone Encounter (Signed)
 Copied from CRM #[id number]. Topic: Clinical - Medication Refill >> Oct 29, 2023  9:43 AM Carlatta H wrote: Medication:  ezetimibe  (ZETIA ) 10 MG tablet Has the patient contacted their pharmacy? No (Agent: If no, request that the patient contact the pharmacy for the refill. If patient does not wish to contact the pharmacy document the reason why and proceed with request.) (Agent: If yes, when and what did the pharmacy advise?)  This is the patient's preferred pharmacy:  [facility] - [place], [place] - [address] [place] [address] Phone: [phone] Fax: [phone]  Is this the correct pharmacy for this prescription? Yes If no, delete pharmacy and type the correct one.   Has the prescription been filled recently? No  Is the patient out of the medication? Yes  Has the patient been seen for an appointment in the last year OR does the patient have an upcoming appointment? Yes  Can we respond through MyChart? Yes  Agent: Please be advised that Rx refills may take up to 3 business days. We ask that you follow-up with your pharmacy.

## 2023-10-29 NOTE — Telephone Encounter (Signed)
 Requested medications are due for refill today.  yes  Requested medications are on the active medications list.  yes  Last refill. 05/11/2023 #90 1 rf  Future visit scheduled.   no  Notes to clinic.  Expired labs    Requested Prescriptions  Pending Prescriptions Disp Refills   ezetimibe  (ZETIA ) 10 MG tablet [Pharmacy Med Name: EZETIMIBE  10 MG TAB] 90 tablet 1    Sig: TAKE 1 TABLET BY MOUTH EVERY MORNING     Cardiovascular:  Antilipid - Sterol Transport Inhibitors Failed - 10/29/2023  5:12 PM      Failed - Lipid Panel in normal range within the last 12 months    Cholesterol, Total  Date Value Ref Range Status  10/26/2022 208 (H) 100 - 199 mg/dL Final   LDL Chol Calc (NIH)  Date Value Ref Range Status  10/26/2022 126 (H) 0 - 99 mg/dL Final   HDL  Date Value Ref Range Status  10/26/2022 65 >39 mg/dL Final   Triglycerides  Date Value Ref Range Status  10/26/2022 96 0 - 149 mg/dL Final         Passed - AST in normal range and within 360 days    AST  Date Value Ref Range Status  10/21/2023 20 0 - 40 IU/L Final         Passed - ALT in normal range and within 360 days    ALT  Date Value Ref Range Status  10/21/2023 8 0 - 32 IU/L Final         Passed - Patient is not pregnant      Passed - Valid encounter within last 12 months    Recent Outpatient Visits           1 week ago Annual physical exam   [facility], Lauraine SAILOR, DO

## 2023-11-01 ENCOUNTER — Ambulatory Visit: Payer: Self-pay | Admitting: Family Medicine

## 2023-11-01 DIAGNOSIS — N184 Chronic kidney disease, stage 4 (severe): Secondary | ICD-10-CM

## 2023-11-22 ENCOUNTER — Other Ambulatory Visit: Payer: Self-pay | Admitting: Family Medicine

## 2024-04-12 ENCOUNTER — Ambulatory Visit: Admitting: Family Medicine
